# Patient Record
Sex: Female | Born: 1935 | Race: White | Hispanic: No | Marital: Married | Smoking: Former smoker
Health system: Southern US, Community
[De-identification: ages and names within clinical notes are randomized; demographics above are authoritative.]

## PROBLEM LIST (undated history)

## (undated) DIAGNOSIS — I341 Nonrheumatic mitral (valve) prolapse: Secondary | ICD-10-CM

## (undated) DIAGNOSIS — E039 Hypothyroidism, unspecified: Secondary | ICD-10-CM

## (undated) DIAGNOSIS — I5032 Chronic diastolic (congestive) heart failure: Secondary | ICD-10-CM

## (undated) DIAGNOSIS — E7800 Pure hypercholesterolemia, unspecified: Secondary | ICD-10-CM

## (undated) DIAGNOSIS — I4891 Unspecified atrial fibrillation: Secondary | ICD-10-CM

## (undated) DIAGNOSIS — J189 Pneumonia, unspecified organism: Secondary | ICD-10-CM

## (undated) DIAGNOSIS — E785 Hyperlipidemia, unspecified: Secondary | ICD-10-CM

## (undated) DIAGNOSIS — I10 Essential (primary) hypertension: Secondary | ICD-10-CM

## (undated) DIAGNOSIS — M545 Other chronic pain: Secondary | ICD-10-CM

## (undated) DIAGNOSIS — G8929 Other chronic pain: Secondary | ICD-10-CM

## (undated) DIAGNOSIS — D649 Anemia, unspecified: Secondary | ICD-10-CM

## (undated) DIAGNOSIS — I340 Nonrheumatic mitral (valve) insufficiency: Secondary | ICD-10-CM

## (undated) DIAGNOSIS — B029 Zoster without complications: Secondary | ICD-10-CM

## (undated) HISTORY — DX: Unspecified atrial fibrillation: I48.91

## (undated) HISTORY — DX: Other chronic pain: M54.50

## (undated) HISTORY — DX: Hypothyroidism, unspecified: E03.9

## (undated) HISTORY — DX: Essential (primary) hypertension: I10

## (undated) HISTORY — DX: Pneumonia, unspecified organism: J18.9

## (undated) HISTORY — DX: Other chronic pain: G89.29

## (undated) HISTORY — PX: OTHER SURGICAL HISTORY: SHX169

## (undated) HISTORY — DX: Zoster without complications: B02.9

## (undated) HISTORY — DX: Nonrheumatic mitral (valve) prolapse: I34.1

## (undated) HISTORY — DX: Low back pain: M54.5

## (undated) HISTORY — DX: Chronic diastolic (congestive) heart failure: I50.32

## (undated) HISTORY — DX: Anemia, unspecified: D64.9

## (undated) HISTORY — DX: Pure hypercholesterolemia, unspecified: E78.00

## (undated) HISTORY — DX: Nonrheumatic mitral (valve) insufficiency: I34.0

## (undated) HISTORY — DX: Hyperlipidemia, unspecified: E78.5

---

## 1997-11-29 ENCOUNTER — Ambulatory Visit (HOSPITAL_COMMUNITY): Admission: RE | Admit: 1997-11-29 | Discharge: 1997-11-29 | Payer: Self-pay | Admitting: Obstetrics and Gynecology

## 1997-12-04 ENCOUNTER — Ambulatory Visit (HOSPITAL_COMMUNITY): Admission: RE | Admit: 1997-12-04 | Discharge: 1997-12-04 | Payer: Self-pay | Admitting: Obstetrics and Gynecology

## 1998-12-09 ENCOUNTER — Ambulatory Visit (HOSPITAL_COMMUNITY): Admission: RE | Admit: 1998-12-09 | Discharge: 1998-12-09 | Payer: Self-pay | Admitting: Obstetrics and Gynecology

## 1998-12-09 ENCOUNTER — Encounter: Payer: Self-pay | Admitting: Obstetrics and Gynecology

## 1999-12-17 ENCOUNTER — Ambulatory Visit (HOSPITAL_COMMUNITY): Admission: RE | Admit: 1999-12-17 | Discharge: 1999-12-17 | Payer: Self-pay | Admitting: Obstetrics and Gynecology

## 1999-12-17 ENCOUNTER — Encounter: Payer: Self-pay | Admitting: Obstetrics and Gynecology

## 2000-12-20 ENCOUNTER — Ambulatory Visit (HOSPITAL_COMMUNITY): Admission: RE | Admit: 2000-12-20 | Discharge: 2000-12-20 | Payer: Self-pay | Admitting: Pulmonary Disease

## 2000-12-20 ENCOUNTER — Encounter: Payer: Self-pay | Admitting: Pulmonary Disease

## 2001-11-30 ENCOUNTER — Other Ambulatory Visit: Admission: RE | Admit: 2001-11-30 | Discharge: 2001-11-30 | Payer: Self-pay | Admitting: Obstetrics and Gynecology

## 2001-12-27 ENCOUNTER — Encounter: Payer: Self-pay | Admitting: Pulmonary Disease

## 2001-12-27 ENCOUNTER — Ambulatory Visit (HOSPITAL_COMMUNITY): Admission: RE | Admit: 2001-12-27 | Discharge: 2001-12-27 | Payer: Self-pay | Admitting: Pulmonary Disease

## 2003-01-03 ENCOUNTER — Ambulatory Visit (HOSPITAL_COMMUNITY): Admission: RE | Admit: 2003-01-03 | Discharge: 2003-01-03 | Payer: Self-pay | Admitting: Pulmonary Disease

## 2003-01-03 ENCOUNTER — Encounter: Payer: Self-pay | Admitting: Pulmonary Disease

## 2003-08-25 ENCOUNTER — Emergency Department (HOSPITAL_COMMUNITY): Admission: EM | Admit: 2003-08-25 | Discharge: 2003-08-25 | Payer: Self-pay | Admitting: Emergency Medicine

## 2004-01-15 ENCOUNTER — Ambulatory Visit (HOSPITAL_COMMUNITY): Admission: RE | Admit: 2004-01-15 | Discharge: 2004-01-15 | Payer: Self-pay | Admitting: Obstetrics and Gynecology

## 2004-07-31 ENCOUNTER — Ambulatory Visit: Payer: Self-pay

## 2004-08-21 ENCOUNTER — Ambulatory Visit: Payer: Self-pay | Admitting: Pulmonary Disease

## 2004-08-28 ENCOUNTER — Ambulatory Visit: Payer: Self-pay | Admitting: Cardiology

## 2004-09-25 ENCOUNTER — Ambulatory Visit: Payer: Self-pay | Admitting: Cardiovascular Disease

## 2004-09-25 ENCOUNTER — Emergency Department (HOSPITAL_COMMUNITY): Admission: EM | Admit: 2004-09-25 | Discharge: 2004-09-25 | Payer: Self-pay | Admitting: Family Medicine

## 2004-10-23 ENCOUNTER — Ambulatory Visit: Payer: Self-pay | Admitting: Cardiology

## 2004-11-20 ENCOUNTER — Ambulatory Visit: Payer: Self-pay | Admitting: Internal Medicine

## 2004-12-18 ENCOUNTER — Ambulatory Visit: Payer: Self-pay | Admitting: Internal Medicine

## 2005-01-15 ENCOUNTER — Ambulatory Visit: Payer: Self-pay | Admitting: Cardiovascular Disease

## 2005-01-18 ENCOUNTER — Ambulatory Visit (HOSPITAL_COMMUNITY): Admission: RE | Admit: 2005-01-18 | Discharge: 2005-01-18 | Payer: Self-pay | Admitting: Pulmonary Disease

## 2005-01-21 ENCOUNTER — Ambulatory Visit: Payer: Self-pay | Admitting: Pulmonary Disease

## 2005-01-27 ENCOUNTER — Ambulatory Visit: Payer: Self-pay | Admitting: Pulmonary Disease

## 2005-02-05 ENCOUNTER — Ambulatory Visit: Payer: Self-pay | Admitting: Cardiology

## 2005-03-05 ENCOUNTER — Ambulatory Visit: Payer: Self-pay | Admitting: Cardiology

## 2005-04-02 ENCOUNTER — Ambulatory Visit: Payer: Self-pay | Admitting: Cardiology

## 2005-04-23 ENCOUNTER — Ambulatory Visit: Payer: Self-pay | Admitting: Cardiology

## 2005-05-21 ENCOUNTER — Ambulatory Visit: Payer: Self-pay | Admitting: Internal Medicine

## 2005-06-11 ENCOUNTER — Ambulatory Visit: Payer: Self-pay | Admitting: Cardiology

## 2005-06-25 ENCOUNTER — Ambulatory Visit: Payer: Self-pay | Admitting: Pulmonary Disease

## 2005-07-09 ENCOUNTER — Ambulatory Visit: Payer: Self-pay | Admitting: Cardiology

## 2005-07-22 ENCOUNTER — Ambulatory Visit: Payer: Self-pay | Admitting: Pulmonary Disease

## 2005-08-09 ENCOUNTER — Ambulatory Visit: Payer: Self-pay | Admitting: Cardiology

## 2005-09-10 ENCOUNTER — Ambulatory Visit: Payer: Self-pay | Admitting: Cardiology

## 2005-10-08 ENCOUNTER — Ambulatory Visit: Payer: Self-pay | Admitting: Cardiology

## 2005-11-05 ENCOUNTER — Ambulatory Visit: Payer: Self-pay | Admitting: Internal Medicine

## 2005-12-03 ENCOUNTER — Ambulatory Visit: Payer: Self-pay | Admitting: Cardiovascular Disease

## 2005-12-31 ENCOUNTER — Ambulatory Visit: Payer: Self-pay | Admitting: Cardiology

## 2006-01-19 ENCOUNTER — Ambulatory Visit (HOSPITAL_COMMUNITY): Admission: RE | Admit: 2006-01-19 | Discharge: 2006-01-19 | Payer: Self-pay | Admitting: Pulmonary Disease

## 2006-01-20 ENCOUNTER — Ambulatory Visit: Payer: Self-pay | Admitting: Pulmonary Disease

## 2006-01-25 ENCOUNTER — Ambulatory Visit: Payer: Self-pay | Admitting: Pulmonary Disease

## 2006-01-28 ENCOUNTER — Ambulatory Visit: Payer: Self-pay | Admitting: Cardiology

## 2006-02-25 ENCOUNTER — Ambulatory Visit: Payer: Self-pay | Admitting: Cardiology

## 2006-03-25 ENCOUNTER — Ambulatory Visit: Payer: Self-pay | Admitting: Cardiology

## 2006-04-22 ENCOUNTER — Ambulatory Visit: Payer: Self-pay | Admitting: Cardiology

## 2006-04-24 ENCOUNTER — Emergency Department (HOSPITAL_COMMUNITY): Admission: EM | Admit: 2006-04-24 | Discharge: 2006-04-24 | Payer: Self-pay | Admitting: Emergency Medicine

## 2006-05-20 ENCOUNTER — Ambulatory Visit: Payer: Self-pay | Admitting: Cardiology

## 2006-06-20 ENCOUNTER — Ambulatory Visit: Payer: Self-pay | Admitting: Cardiovascular Disease

## 2006-06-28 ENCOUNTER — Ambulatory Visit: Payer: Self-pay | Admitting: Pulmonary Disease

## 2006-07-08 ENCOUNTER — Ambulatory Visit: Payer: Self-pay | Admitting: Internal Medicine

## 2006-07-25 ENCOUNTER — Ambulatory Visit: Payer: Self-pay | Admitting: Internal Medicine

## 2006-07-25 ENCOUNTER — Ambulatory Visit: Payer: Self-pay | Admitting: Pulmonary Disease

## 2006-07-29 ENCOUNTER — Ambulatory Visit: Payer: Self-pay | Admitting: Cardiovascular Disease

## 2006-08-25 ENCOUNTER — Ambulatory Visit: Payer: Self-pay | Admitting: Pulmonary Disease

## 2006-08-26 ENCOUNTER — Ambulatory Visit: Payer: Self-pay | Admitting: Internal Medicine

## 2006-09-02 ENCOUNTER — Ambulatory Visit: Payer: Self-pay | Admitting: Cardiovascular Disease

## 2006-09-16 ENCOUNTER — Ambulatory Visit: Payer: Self-pay | Admitting: Cardiology

## 2006-09-30 ENCOUNTER — Ambulatory Visit: Payer: Self-pay | Admitting: Cardiovascular Disease

## 2006-10-21 ENCOUNTER — Ambulatory Visit: Payer: Self-pay | Admitting: *Deleted

## 2006-11-18 ENCOUNTER — Ambulatory Visit: Payer: Self-pay | Admitting: Cardiology

## 2006-12-09 ENCOUNTER — Ambulatory Visit: Payer: Self-pay | Admitting: Cardiology

## 2006-12-30 ENCOUNTER — Ambulatory Visit: Payer: Self-pay | Admitting: Cardiology

## 2007-01-13 ENCOUNTER — Ambulatory Visit: Payer: Self-pay | Admitting: Cardiology

## 2007-01-17 ENCOUNTER — Ambulatory Visit: Payer: Self-pay | Admitting: Pulmonary Disease

## 2007-01-17 LAB — CONVERTED CEMR LAB
AST: 30 units/L (ref 0–37)
Albumin: 3.8 g/dL (ref 3.5–5.2)
Alkaline Phosphatase: 59 units/L (ref 39–117)
BUN: 12 mg/dL (ref 6–23)
Basophils Absolute: 0.1 10*3/uL (ref 0.0–0.1)
Chloride: 106 meq/L (ref 96–112)
Creatinine, Ser: 0.9 mg/dL (ref 0.4–1.2)
HCT: 40.5 % (ref 36.0–46.0)
HDL: 55.8 mg/dL (ref >39.0)
Hemoglobin, Urine: NEGATIVE
Ketones, ur: NEGATIVE mg/dL
LDL Cholesterol: 102 mg/dL — ABNORMAL HIGH (ref 0–99)
MCHC: 33.1 g/dL (ref 30.0–36.0)
Monocytes Relative: 8.8 % (ref 3.0–11.0)
RBC: 4.07 M/uL (ref 3.87–5.11)
RDW: 13 % (ref 11.5–14.6)
Total Bilirubin: 1 mg/dL (ref 0.3–1.2)
Total Protein, Urine: NEGATIVE mg/dL
Urobilinogen, UA: 0.2 (ref 0.0–1.0)
VLDL: 23 mg/dL (ref 0–40)
pH: 7 (ref 5.0–8.0)

## 2007-01-23 ENCOUNTER — Ambulatory Visit: Payer: Self-pay | Admitting: Pulmonary Disease

## 2007-01-24 ENCOUNTER — Ambulatory Visit (HOSPITAL_COMMUNITY): Admission: RE | Admit: 2007-01-24 | Discharge: 2007-01-24 | Payer: Self-pay | Admitting: Pulmonary Disease

## 2007-02-03 ENCOUNTER — Ambulatory Visit: Payer: Self-pay | Admitting: Internal Medicine

## 2007-02-17 ENCOUNTER — Ambulatory Visit: Payer: Self-pay | Admitting: Cardiology

## 2007-02-21 ENCOUNTER — Ambulatory Visit: Payer: Self-pay

## 2007-02-21 ENCOUNTER — Encounter: Payer: Self-pay | Admitting: Pulmonary Disease

## 2007-02-24 ENCOUNTER — Ambulatory Visit: Payer: Self-pay | Admitting: Pulmonary Disease

## 2007-03-02 ENCOUNTER — Ambulatory Visit: Payer: Self-pay | Admitting: Cardiovascular Disease

## 2007-03-02 ENCOUNTER — Ambulatory Visit: Payer: Self-pay | Admitting: Internal Medicine

## 2007-03-16 ENCOUNTER — Ambulatory Visit: Payer: Self-pay | Admitting: Internal Medicine

## 2007-04-07 ENCOUNTER — Ambulatory Visit: Payer: Self-pay | Admitting: Cardiology

## 2007-05-05 ENCOUNTER — Ambulatory Visit: Payer: Self-pay | Admitting: Cardiovascular Disease

## 2007-06-05 ENCOUNTER — Ambulatory Visit: Payer: Self-pay | Admitting: Internal Medicine

## 2007-06-28 ENCOUNTER — Ambulatory Visit: Payer: Self-pay | Admitting: Pulmonary Disease

## 2007-06-30 ENCOUNTER — Ambulatory Visit: Payer: Self-pay | Admitting: Cardiology

## 2007-07-24 ENCOUNTER — Ambulatory Visit: Payer: Self-pay | Admitting: Pulmonary Disease

## 2007-07-28 ENCOUNTER — Ambulatory Visit: Payer: Self-pay | Admitting: Cardiology

## 2007-08-14 ENCOUNTER — Ambulatory Visit: Payer: Self-pay | Admitting: Internal Medicine

## 2007-09-01 ENCOUNTER — Ambulatory Visit: Payer: Self-pay | Admitting: Cardiology

## 2007-09-18 ENCOUNTER — Ambulatory Visit: Payer: Self-pay | Admitting: Cardiology

## 2007-10-06 ENCOUNTER — Ambulatory Visit: Payer: Self-pay | Admitting: Internal Medicine

## 2007-10-27 ENCOUNTER — Ambulatory Visit: Payer: Self-pay | Admitting: Internal Medicine

## 2007-11-10 ENCOUNTER — Ambulatory Visit: Payer: Self-pay | Admitting: Internal Medicine

## 2007-11-15 ENCOUNTER — Telehealth (INDEPENDENT_AMBULATORY_CARE_PROVIDER_SITE_OTHER): Payer: Self-pay | Admitting: *Deleted

## 2007-11-17 ENCOUNTER — Telehealth (INDEPENDENT_AMBULATORY_CARE_PROVIDER_SITE_OTHER): Payer: Self-pay | Admitting: *Deleted

## 2007-12-01 ENCOUNTER — Ambulatory Visit: Payer: Self-pay | Admitting: Cardiovascular Disease

## 2007-12-29 ENCOUNTER — Ambulatory Visit: Payer: Self-pay | Admitting: Internal Medicine

## 2008-01-15 ENCOUNTER — Telehealth (INDEPENDENT_AMBULATORY_CARE_PROVIDER_SITE_OTHER): Payer: Self-pay | Admitting: *Deleted

## 2008-01-17 ENCOUNTER — Ambulatory Visit: Payer: Self-pay | Admitting: Pulmonary Disease

## 2008-01-17 DIAGNOSIS — M810 Age-related osteoporosis without current pathological fracture: Secondary | ICD-10-CM | POA: Insufficient documentation

## 2008-01-17 DIAGNOSIS — Z87898 Personal history of other specified conditions: Secondary | ICD-10-CM | POA: Insufficient documentation

## 2008-01-17 DIAGNOSIS — E039 Hypothyroidism, unspecified: Secondary | ICD-10-CM | POA: Insufficient documentation

## 2008-01-17 DIAGNOSIS — M545 Low back pain: Secondary | ICD-10-CM | POA: Insufficient documentation

## 2008-01-17 DIAGNOSIS — J309 Allergic rhinitis, unspecified: Secondary | ICD-10-CM | POA: Insufficient documentation

## 2008-01-18 ENCOUNTER — Ambulatory Visit: Payer: Self-pay | Admitting: Pulmonary Disease

## 2008-01-18 DIAGNOSIS — I059 Rheumatic mitral valve disease, unspecified: Secondary | ICD-10-CM | POA: Insufficient documentation

## 2008-01-18 DIAGNOSIS — I340 Nonrheumatic mitral (valve) insufficiency: Secondary | ICD-10-CM | POA: Insufficient documentation

## 2008-01-18 DIAGNOSIS — H9190 Unspecified hearing loss, unspecified ear: Secondary | ICD-10-CM | POA: Insufficient documentation

## 2008-01-18 DIAGNOSIS — I4891 Unspecified atrial fibrillation: Secondary | ICD-10-CM

## 2008-01-18 LAB — CONVERTED CEMR LAB
ALT: 21 units/L (ref 0–35)
AST: 30 units/L (ref 0–37)
Alkaline Phosphatase: 60 units/L (ref 39–117)
Basophils Absolute: 0.1 10*3/uL (ref 0.0–0.1)
Basophils Relative: 0.9 % (ref 0.0–1.0)
CO2: 27 meq/L (ref 19–32)
Chloride: 108 meq/L (ref 96–112)
Creatinine, Ser: 0.9 mg/dL (ref 0.4–1.2)
Eosinophils Relative: 2 % (ref 0.0–5.0)
LDL Cholesterol: 108 mg/dL — ABNORMAL HIGH (ref 0–99)
Lymphocytes Relative: 24 % (ref 12.0–46.0)
MCHC: 32.5 g/dL (ref 30.0–36.0)
Neutrophils Relative %: 63 % (ref 43.0–77.0)
Potassium: 4.5 meq/L (ref 3.5–5.1)
RBC: 3.91 M/uL (ref 3.87–5.11)
TSH: 3.71 microintl units/mL (ref 0.35–5.50)
Total Bilirubin: 1.3 mg/dL — ABNORMAL HIGH (ref 0.3–1.2)
VLDL: 19 mg/dL (ref 0–40)
WBC: 5.6 10*3/uL (ref 4.5–10.5)

## 2008-01-25 ENCOUNTER — Ambulatory Visit (HOSPITAL_COMMUNITY): Admission: RE | Admit: 2008-01-25 | Discharge: 2008-01-25 | Payer: Self-pay | Admitting: Pulmonary Disease

## 2008-01-26 ENCOUNTER — Ambulatory Visit: Payer: Self-pay | Admitting: Cardiology

## 2008-02-23 ENCOUNTER — Ambulatory Visit: Payer: Self-pay | Admitting: Cardiology

## 2008-03-14 ENCOUNTER — Ambulatory Visit: Payer: Self-pay | Admitting: Cardiology

## 2008-04-12 ENCOUNTER — Ambulatory Visit: Payer: Self-pay | Admitting: Cardiology

## 2008-04-26 ENCOUNTER — Ambulatory Visit: Payer: Self-pay | Admitting: Cardiovascular Disease

## 2008-05-24 ENCOUNTER — Ambulatory Visit: Payer: Self-pay | Admitting: Internal Medicine

## 2008-06-21 ENCOUNTER — Ambulatory Visit: Payer: Self-pay | Admitting: Cardiology

## 2008-07-17 ENCOUNTER — Ambulatory Visit: Payer: Self-pay | Admitting: Pulmonary Disease

## 2008-07-19 ENCOUNTER — Ambulatory Visit: Payer: Self-pay | Admitting: Cardiology

## 2008-08-16 ENCOUNTER — Ambulatory Visit: Payer: Self-pay | Admitting: Internal Medicine

## 2008-08-28 ENCOUNTER — Ambulatory Visit: Payer: Self-pay | Admitting: Internal Medicine

## 2008-08-28 ENCOUNTER — Encounter: Payer: Self-pay | Admitting: Pulmonary Disease

## 2008-09-12 ENCOUNTER — Ambulatory Visit: Payer: Self-pay | Admitting: Internal Medicine

## 2008-10-11 ENCOUNTER — Ambulatory Visit: Payer: Self-pay | Admitting: Cardiovascular Disease

## 2008-11-08 ENCOUNTER — Ambulatory Visit: Payer: Self-pay | Admitting: Internal Medicine

## 2008-11-12 ENCOUNTER — Telehealth: Payer: Self-pay | Admitting: Pulmonary Disease

## 2008-11-19 ENCOUNTER — Telehealth (INDEPENDENT_AMBULATORY_CARE_PROVIDER_SITE_OTHER): Payer: Self-pay | Admitting: *Deleted

## 2008-12-06 ENCOUNTER — Ambulatory Visit: Payer: Self-pay | Admitting: Cardiology

## 2008-12-23 ENCOUNTER — Ambulatory Visit: Payer: Self-pay | Admitting: Internal Medicine

## 2009-01-03 ENCOUNTER — Ambulatory Visit: Payer: Self-pay | Admitting: Cardiology

## 2009-01-07 ENCOUNTER — Telehealth: Payer: Self-pay | Admitting: Pulmonary Disease

## 2009-01-13 ENCOUNTER — Ambulatory Visit: Payer: Self-pay | Admitting: Pulmonary Disease

## 2009-01-13 DIAGNOSIS — I10 Essential (primary) hypertension: Secondary | ICD-10-CM | POA: Insufficient documentation

## 2009-01-16 ENCOUNTER — Ambulatory Visit: Payer: Self-pay | Admitting: Pulmonary Disease

## 2009-01-16 LAB — CONVERTED CEMR LAB
AST: 27 units/L (ref 0–37)
Alkaline Phosphatase: 60 units/L (ref 39–117)
Basophils Absolute: 0 10*3/uL (ref 0.0–0.1)
Bilirubin, Direct: 0.2 mg/dL (ref 0.0–0.3)
Calcium: 9.2 mg/dL (ref 8.4–10.5)
GFR calc non Af Amer: 72.33 mL/min (ref >60)
Glucose, Bld: 82 mg/dL (ref 70–99)
HCT: 37.1 % (ref 36.0–46.0)
Hemoglobin: 12.5 g/dL (ref 12.0–15.0)
LDL Cholesterol: 94 mg/dL (ref 0–99)
Lymphs Abs: 1.4 10*3/uL (ref 0.7–4.0)
MCV: 102.4 fL — ABNORMAL HIGH (ref 78.0–100.0)
Monocytes Absolute: 0.6 10*3/uL (ref 0.1–1.0)
Neutro Abs: 4.6 10*3/uL (ref 1.4–7.7)
Platelets: 181 10*3/uL (ref 150.0–400.0)
Potassium: 4.5 meq/L (ref 3.5–5.1)
RDW: 13.1 % (ref 11.5–14.6)
Sodium: 141 meq/L (ref 135–145)
TSH: 3.5 microintl units/mL (ref 0.35–5.50)
Total Bilirubin: 1 mg/dL (ref 0.3–1.2)
Total CHOL/HDL Ratio: 3
VLDL: 17.2 mg/dL (ref 0.0–40.0)

## 2009-01-20 ENCOUNTER — Ambulatory Visit: Payer: Self-pay | Admitting: Cardiovascular Disease

## 2009-02-03 ENCOUNTER — Ambulatory Visit (HOSPITAL_COMMUNITY): Admission: RE | Admit: 2009-02-03 | Discharge: 2009-02-03 | Payer: Self-pay | Admitting: Pulmonary Disease

## 2009-02-11 ENCOUNTER — Encounter: Payer: Self-pay | Admitting: *Deleted

## 2009-02-14 ENCOUNTER — Ambulatory Visit: Payer: Self-pay | Admitting: Cardiology

## 2009-03-14 ENCOUNTER — Ambulatory Visit: Payer: Self-pay | Admitting: Cardiovascular Disease

## 2009-03-14 LAB — CONVERTED CEMR LAB
POC INR: 2.3
Prothrombin Time: 18.7 s

## 2009-03-18 ENCOUNTER — Telehealth (INDEPENDENT_AMBULATORY_CARE_PROVIDER_SITE_OTHER): Payer: Self-pay | Admitting: *Deleted

## 2009-03-19 ENCOUNTER — Encounter: Payer: Self-pay | Admitting: *Deleted

## 2009-04-11 ENCOUNTER — Ambulatory Visit: Payer: Self-pay | Admitting: Cardiology

## 2009-04-11 LAB — CONVERTED CEMR LAB: POC INR: 2.8

## 2009-04-23 ENCOUNTER — Encounter: Payer: Self-pay | Admitting: Cardiology

## 2009-05-09 ENCOUNTER — Ambulatory Visit: Payer: Self-pay | Admitting: Cardiovascular Disease

## 2009-05-09 LAB — CONVERTED CEMR LAB: POC INR: 3.2

## 2009-05-30 ENCOUNTER — Ambulatory Visit: Payer: Self-pay | Admitting: Internal Medicine

## 2009-05-30 LAB — CONVERTED CEMR LAB: POC INR: 3.7

## 2009-06-16 ENCOUNTER — Encounter: Payer: Self-pay | Admitting: Pulmonary Disease

## 2009-06-16 ENCOUNTER — Ambulatory Visit: Payer: Self-pay | Admitting: Internal Medicine

## 2009-07-11 ENCOUNTER — Ambulatory Visit: Payer: Self-pay | Admitting: Internal Medicine

## 2009-07-11 LAB — CONVERTED CEMR LAB: POC INR: 2.8

## 2009-07-16 ENCOUNTER — Ambulatory Visit: Payer: Self-pay | Admitting: Pulmonary Disease

## 2009-08-11 ENCOUNTER — Ambulatory Visit: Payer: Self-pay | Admitting: Cardiology

## 2009-08-11 LAB — CONVERTED CEMR LAB: POC INR: 2.9

## 2009-09-04 ENCOUNTER — Ambulatory Visit: Payer: Self-pay | Admitting: Cardiology

## 2009-09-11 ENCOUNTER — Encounter: Payer: Self-pay | Admitting: Cardiology

## 2009-10-06 ENCOUNTER — Ambulatory Visit: Payer: Self-pay | Admitting: Cardiology

## 2009-10-24 ENCOUNTER — Encounter (INDEPENDENT_AMBULATORY_CARE_PROVIDER_SITE_OTHER): Payer: Self-pay | Admitting: Cardiology

## 2009-10-24 ENCOUNTER — Ambulatory Visit: Payer: Self-pay | Admitting: Internal Medicine

## 2009-10-24 LAB — CONVERTED CEMR LAB: POC INR: 2.6

## 2009-11-21 ENCOUNTER — Ambulatory Visit: Payer: Self-pay | Admitting: Cardiology

## 2009-11-21 LAB — CONVERTED CEMR LAB: POC INR: 2.5

## 2009-12-19 ENCOUNTER — Ambulatory Visit: Payer: Self-pay | Admitting: Cardiology

## 2010-01-12 ENCOUNTER — Ambulatory Visit: Payer: Self-pay | Admitting: Pulmonary Disease

## 2010-01-13 ENCOUNTER — Ambulatory Visit: Payer: Self-pay | Admitting: Pulmonary Disease

## 2010-01-16 ENCOUNTER — Ambulatory Visit: Payer: Self-pay | Admitting: Internal Medicine

## 2010-01-16 LAB — CONVERTED CEMR LAB: POC INR: 2.8

## 2010-01-18 LAB — CONVERTED CEMR LAB
Alkaline Phosphatase: 68 units/L (ref 39–117)
BUN: 12 mg/dL (ref 6–23)
Basophils Absolute: 0 10*3/uL (ref 0.0–0.1)
Bilirubin, Direct: 0.2 mg/dL (ref 0.0–0.3)
Chloride: 104 meq/L (ref 96–112)
Cholesterol: 170 mg/dL (ref 0–200)
Eosinophils Absolute: 0.1 10*3/uL (ref 0.0–0.7)
GFR calc non Af Amer: 62.99 mL/min (ref >60)
HDL: 57.2 mg/dL (ref >39.00)
LDL Cholesterol: 94 mg/dL (ref 0–99)
Lymphocytes Relative: 20.4 % (ref 12.0–46.0)
MCHC: 33.8 g/dL (ref 30.0–36.0)
MCV: 98.7 fL (ref 78.0–100.0)
Monocytes Absolute: 0.7 10*3/uL (ref 0.1–1.0)
Neutrophils Relative %: 66.2 % (ref 43.0–77.0)
Platelets: 185 10*3/uL (ref 150.0–400.0)
Potassium: 4.7 meq/L (ref 3.5–5.1)
RDW: 15.3 % — ABNORMAL HIGH (ref 11.5–14.6)
Sodium: 139 meq/L (ref 135–145)
Total Bilirubin: 1 mg/dL (ref 0.3–1.2)
Total Protein: 6.3 g/dL (ref 6.0–8.3)
VLDL: 18.8 mg/dL (ref 0.0–40.0)
Vit D, 25-Hydroxy: 36 ng/mL (ref 30–89)

## 2010-02-05 ENCOUNTER — Ambulatory Visit (HOSPITAL_COMMUNITY): Admission: RE | Admit: 2010-02-05 | Discharge: 2010-02-05 | Payer: Self-pay | Admitting: Pulmonary Disease

## 2010-02-06 ENCOUNTER — Ambulatory Visit: Payer: Self-pay | Admitting: Cardiology

## 2010-02-06 LAB — CONVERTED CEMR LAB: POC INR: 2.1

## 2010-03-06 ENCOUNTER — Ambulatory Visit: Payer: Self-pay | Admitting: Cardiology

## 2010-04-03 ENCOUNTER — Ambulatory Visit: Payer: Self-pay | Admitting: Cardiology

## 2010-04-03 LAB — CONVERTED CEMR LAB: POC INR: 2

## 2010-05-01 ENCOUNTER — Ambulatory Visit: Payer: Self-pay | Admitting: Cardiology

## 2010-05-01 LAB — CONVERTED CEMR LAB: POC INR: 1.9

## 2010-05-22 ENCOUNTER — Ambulatory Visit: Payer: Self-pay | Admitting: Internal Medicine

## 2010-06-19 ENCOUNTER — Ambulatory Visit: Payer: Self-pay | Admitting: Cardiology

## 2010-07-16 ENCOUNTER — Ambulatory Visit: Payer: Self-pay | Admitting: Pulmonary Disease

## 2010-07-16 DIAGNOSIS — D649 Anemia, unspecified: Secondary | ICD-10-CM | POA: Insufficient documentation

## 2010-07-17 ENCOUNTER — Telehealth: Payer: Self-pay | Admitting: Pulmonary Disease

## 2010-07-17 ENCOUNTER — Ambulatory Visit: Payer: Self-pay | Admitting: Cardiology

## 2010-07-17 LAB — CONVERTED CEMR LAB
BUN: 13 mg/dL (ref 6–23)
Basophils Absolute: 0 10*3/uL (ref 0.0–0.1)
CO2: 25 meq/L (ref 19–32)
Calcium: 9.1 mg/dL (ref 8.4–10.5)
Eosinophils Absolute: 0.1 10*3/uL (ref 0.0–0.7)
Glucose, Bld: 79 mg/dL (ref 70–99)
HCT: 32.1 % — ABNORMAL LOW (ref 36.0–46.0)
Hemoglobin: 10.9 g/dL — ABNORMAL LOW (ref 12.0–15.0)
Lymphocytes Relative: 19 % (ref 12.0–46.0)
Lymphs Abs: 1.2 10*3/uL (ref 0.7–4.0)
MCHC: 34 g/dL (ref 30.0–36.0)
Neutro Abs: 4.1 10*3/uL (ref 1.4–7.7)
Platelets: 200 10*3/uL (ref 150.0–400.0)
RDW: 15.3 % — ABNORMAL HIGH (ref 11.5–14.6)
Sodium: 136 meq/L (ref 135–145)
TSH: 5.43 microintl units/mL (ref 0.35–5.50)

## 2010-07-19 ENCOUNTER — Encounter: Payer: Self-pay | Admitting: Cardiovascular Disease

## 2010-07-19 ENCOUNTER — Inpatient Hospital Stay (HOSPITAL_COMMUNITY): Admission: EM | Admit: 2010-07-19 | Discharge: 2010-07-22 | Payer: Self-pay | Admitting: Emergency Medicine

## 2010-07-20 ENCOUNTER — Telehealth: Payer: Self-pay | Admitting: Pulmonary Disease

## 2010-07-20 ENCOUNTER — Ambulatory Visit: Payer: Self-pay | Admitting: Pulmonary Disease

## 2010-07-20 ENCOUNTER — Encounter (INDEPENDENT_AMBULATORY_CARE_PROVIDER_SITE_OTHER): Payer: Self-pay | Admitting: Internal Medicine

## 2010-07-27 ENCOUNTER — Telehealth (INDEPENDENT_AMBULATORY_CARE_PROVIDER_SITE_OTHER): Payer: Self-pay | Admitting: *Deleted

## 2010-07-29 ENCOUNTER — Ambulatory Visit: Payer: Self-pay | Admitting: Pulmonary Disease

## 2010-07-31 LAB — CONVERTED CEMR LAB
BUN: 21 mg/dL (ref 6–23)
Calcium: 10.2 mg/dL (ref 8.4–10.5)
GFR calc non Af Amer: 61.33 mL/min (ref >60)
Glucose, Bld: 93 mg/dL (ref 70–99)

## 2010-08-03 ENCOUNTER — Telehealth: Payer: Self-pay | Admitting: Pulmonary Disease

## 2010-08-14 ENCOUNTER — Telehealth (INDEPENDENT_AMBULATORY_CARE_PROVIDER_SITE_OTHER): Payer: Self-pay | Admitting: *Deleted

## 2010-08-14 ENCOUNTER — Ambulatory Visit: Payer: Self-pay | Admitting: Cardiovascular Disease

## 2010-08-20 ENCOUNTER — Telehealth: Payer: Self-pay | Admitting: Pulmonary Disease

## 2010-08-24 ENCOUNTER — Telehealth: Payer: Self-pay | Admitting: Pulmonary Disease

## 2010-08-25 ENCOUNTER — Encounter: Payer: Self-pay | Admitting: Internal Medicine

## 2010-08-25 ENCOUNTER — Ambulatory Visit: Payer: Self-pay | Admitting: Pulmonary Disease

## 2010-08-25 ENCOUNTER — Telehealth: Payer: Self-pay | Admitting: Pulmonary Disease

## 2010-08-25 LAB — CONVERTED CEMR LAB
Basophils Absolute: 0 10*3/uL (ref 0.0–0.1)
Eosinophils Relative: 1 % (ref 0.0–5.0)
INR: 4.8 — ABNORMAL HIGH (ref 0.8–1.0)
MCV: 99.7 fL (ref 78.0–100.0)
Monocytes Absolute: 0.8 10*3/uL (ref 0.1–1.0)
Neutrophils Relative %: 69.7 % (ref 43.0–77.0)
Platelets: 190 10*3/uL (ref 150.0–400.0)
Prothrombin Time: 51.5 s — ABNORMAL HIGH (ref 9.7–11.8)
Prothrombin Time: 51.5 s
WBC: 7.1 10*3/uL (ref 4.5–10.5)

## 2010-08-28 ENCOUNTER — Ambulatory Visit: Payer: Self-pay | Admitting: Cardiology

## 2010-08-31 ENCOUNTER — Ambulatory Visit: Payer: Self-pay | Admitting: Pulmonary Disease

## 2010-09-04 ENCOUNTER — Encounter: Payer: Self-pay | Admitting: Pulmonary Disease

## 2010-09-10 ENCOUNTER — Encounter: Payer: Self-pay | Admitting: Pulmonary Disease

## 2010-09-18 ENCOUNTER — Ambulatory Visit: Admission: RE | Admit: 2010-09-18 | Discharge: 2010-09-18 | Payer: Self-pay | Source: Home / Self Care

## 2010-10-01 ENCOUNTER — Ambulatory Visit: Admission: RE | Admit: 2010-10-01 | Discharge: 2010-10-01 | Payer: Self-pay | Source: Home / Self Care

## 2010-10-09 DIAGNOSIS — E7800 Pure hypercholesterolemia, unspecified: Secondary | ICD-10-CM | POA: Insufficient documentation

## 2010-10-13 ENCOUNTER — Encounter: Payer: Self-pay | Admitting: Cardiovascular Disease

## 2010-10-13 ENCOUNTER — Ambulatory Visit: Admission: RE | Admit: 2010-10-13 | Discharge: 2010-10-13 | Payer: Self-pay | Source: Home / Self Care

## 2010-10-13 ENCOUNTER — Ambulatory Visit
Admission: RE | Admit: 2010-10-13 | Discharge: 2010-10-13 | Payer: Self-pay | Source: Home / Self Care | Attending: Cardiovascular Disease | Admitting: Cardiovascular Disease

## 2010-10-13 NOTE — Progress Notes (Signed)
Summary: clonazepam too strong  Phone Note From Other Clinic   Caller: Tiffany at [facility]  Call For: Dr. Kriste Basque Summary of Call: Tiffany states pt saw SN yesterday and he prescribed clonazepam for the pt but the pt was telling her that she took one tablet of this med last night and it "knocked her out" to the point where her husband had trouble getting her to wake up and he had to assist her to the bed. Pt is requesting either a lower dose or a different medication. Please adivise.  Initial call taken by: Carron Curie CMA,  July 17, 2010 11:58 AM  Follow-up for Phone Call        called and spoke with pt about her clonzepam and we will decrease to 1/2 tab daily---her cxr showed some extra fluid and may account for some of her SOB--lab showed some mild anemia which is new for her---she will return to the lab on monday for additional labs and will pick up some stool cards as well--order to asap 2d echo has been placed and we will call her with the results of this---start on lasix 40mg  every morning and the rx has been faxed back to [facility] on [address]--appt scheduled for pt to come back and see SN on 11-21 at 3:30 and pt is aware to come in at 3pm for repeat cxr. Randell Loop CMA  July 17, 2010 2:42 PM     New/Updated Medications: CLONAZEPAM 0.5 MG TABS (CLONAZEPAM) take 1/2  tab by mouth two times a day.Marland KitchenMarland Kitchen

## 2010-10-13 NOTE — Medication Information (Signed)
Summary: rov/sp  Anticoagulant Therapy  Managed by: Eda Keys, PharmD Referring MD: Charlies Constable MD PCP: Kriste Basque MD Supervising MD: Tenny Craw MD, Gunnar Fusi Indication 1: Atrial Fibrillation (ICD-427.31) Lab Used: [facility] Site: [facility] INR POC 2.8 INR RANGE 2 - 3  Dietary changes: no    Health status changes: no    Bleeding/hemorrhagic complications: no    Recent/future hospitalizations: no    Any changes in medication regimen? no    Recent/future dental: no  Any missed doses?: no       Is patient compliant with meds? yes       Allergies: 1)  ! Codeine  Anticoagulation Management History:      The patient is taking warfarin and comes in today for a routine follow up visit.  Positive risk factors for bleeding include an age of 75 years or older.  The bleeding index is 'intermediate risk'.  Positive CHADS2 values include History of HTN and Age > 72 years old.  The start date was 03/24/2001.  Anticoagulation responsible Feliza Diven: Tenny Craw MD, Gunnar Fusi.  INR POC: 2.8.  Cuvette Lot#: [id number].  Exp: 06/2011.    Anticoagulation Management Assessment/Plan:      The patient's current anticoagulation dose is Coumadin 2.5 mg tabs: Take as directed by coumadin clinic..  The target INR is 2.0-3.0.  The next INR is due 06/19/2010.  Anticoagulation instructions were given to patient.  Results were reviewed/authorized by Eda Keys, PharmD.  She was notified by Kennieth Francois.         Prior Anticoagulation Instructions: INR 1.9  Take 1 tablet today then increase dose to 1 tablet every day except 1/2 tablet on Monday and Friday.  Recheck INR in 3 weeks.   Current Anticoagulation Instructions: INR 2.8  Continue taking one tablet every day except one-half tablet on Monday and Friday.  We will see you in 4 weeks.

## 2010-10-13 NOTE — Medication Information (Signed)
Summary: rov/sp  Anticoagulant Therapy  Managed by: Elaina Pattee, PharmD Referring MD: Charlies Constable MD PCP: Kriste Basque MD Supervising MD: Riley Kill MD, Maisie Fus Indication 1: Atrial Fibrillation (ICD-427.31) Lab Used: [facility] Site: [address] INR POC 1.9 INR RANGE 2 - 3  Dietary changes: yes       Details: Tried to keep the same, but was on vacation.  Health status changes: no    Bleeding/hemorrhagic complications: no    Recent/future hospitalizations: no    Any changes in medication regimen? no    Recent/future dental: no  Any missed doses?: no       Is patient compliant with meds? yes       Allergies: 1)  ! Codeine  Anticoagulation Management History:      The patient is taking warfarin and comes in today for a routine follow up visit.  Positive risk factors for bleeding include an age of 75 years or older.  The bleeding index is 'intermediate risk'.  Positive CHADS2 values include History of HTN and Age > 34 years old.  The start date was 03/24/2001.  Anticoagulation responsible provider: Riley Kill MD, Maisie Fus.  INR POC: 1.9.  Cuvette Lot#: [id number].  Exp: 04/2011.    Anticoagulation Management Assessment/Plan:      The patient's current anticoagulation dose is Coumadin 2.5 mg tabs: Take as directed by coumadin clinic..  The target INR is 2.0-3.0.  The next INR is due 04/03/2010.  Anticoagulation instructions were given to patient.  Results were reviewed/authorized by Elaina Pattee, PharmD.  She was notified by Elaina Pattee, PharmD.         Prior Anticoagulation Instructions: INR 2.1  Continue same dose of 1 tablet every day except 1/2 tablet on Monday, Wednesday and Friday   Current Anticoagulation Instructions: INR 1.9. Take 1 tablet today, then take 1 tablet daily except 0.5 tablet on Mon, Wed, Fri. Recheck in 4 weeks.

## 2010-10-13 NOTE — Medication Information (Signed)
Summary: rov/cs   Anticoagulant Therapy  Managed by: Erin Rivers, PharmD Referring Rivers: Erin Rivers PCP: Erin Rivers Supervising Rivers: Erin Rivers, Bruce Indication 1: Atrial Fibrillation (ICD-427.31) Lab Used: [facility]  Site: [facility] INR POC 2.6 INR RANGE 2 - 3  Dietary changes: no    Health status changes: no    Bleeding/hemorrhagic complications: no    Recent/future hospitalizations: no    Any changes in medication regimen? yes       Details: Given Rx for clonazepam yesterday.   Recent/future dental: no  Any missed doses?: no       Is patient compliant with meds? yes      Comments: Pt took 1st dose of clonazepam last night and reported being extremely somnolent and unresponsive. Called Dr. Jodelle Green office and left message with nurse.   Allergies: 1)  ! Codeine  Anticoagulation Management History:      The patient is taking warfarin and comes in today for a routine follow up visit.  Positive risk factors for bleeding include an age of 75 years or older.  The bleeding index is 'intermediate risk'.  Positive CHADS2 values include History of HTN and Age > 32 years old.  The start date was 03/24/2001.  Her last INR was 2.5.  Anticoagulation responsible Judea Riches: Erin Rivers, Smitty Cords.  INR POC: 2.6.  Cuvette Lot#: [id number].  Exp: 07/2011.    Anticoagulation Management Assessment/Plan:      The patient's current anticoagulation dose is Coumadin 2.5 mg tabs: Take as directed by coumadin clinic..  The target INR is 2.0-3.0.  The next INR is due 08/14/2010.  Anticoagulation instructions were given to patient.  Results were reviewed/authorized by Erin Rivers, PharmD.  She was notified by Erin Rivers PharmD.         Prior Anticoagulation Instructions: INR 2.5  Continue Coumadin as scheduled:  1 tablet every day of the week except 1/2 tablet on Monday and Friday.    Return to clinic in 4 weeks.    Current Anticoagulation Instructions: INR 2.6 Continue 1 pill everyday except 1/2  pill on Mondays and Fridays. Recheck in 4 weeks.

## 2010-10-13 NOTE — Medication Information (Signed)
Summary: rov/ln  Anticoagulant Therapy  Managed by: Weston Brass, PharmD Referring MD: Charlies Constable MD PCP: Kriste Basque MD Supervising MD: Riley Kill MD, Maisie Fus Indication 1: Atrial Fibrillation (ICD-427.31) Lab Used: [facility] Site: [facility] INR POC 1.9 INR RANGE 2 - 3  Dietary changes: yes       Details: increased vitamin k   Health status changes: no    Bleeding/hemorrhagic complications: no    Recent/future hospitalizations: no    Any changes in medication regimen? no    Recent/future dental: no  Any missed doses?: no       Is patient compliant with meds? yes       Allergies: 1)  ! Codeine  Anticoagulation Management History:      The patient is taking warfarin and comes in today for a routine follow up visit.  Positive risk factors for bleeding include an age of 75 years or older.  The bleeding index is 'intermediate risk'.  Positive CHADS2 values include History of HTN and Age > 58 years old.  The start date was 03/24/2001.  Anticoagulation responsible provider: Riley Kill MD, Maisie Fus.  INR POC: 1.9.  Cuvette Lot#: [id number].  Exp: 06/2011.    Anticoagulation Management Assessment/Plan:      The patient's current anticoagulation dose is Coumadin 2.5 mg tabs: Take as directed by coumadin clinic..  The target INR is 2.0-3.0.  The next INR is due 05/22/2010.  Anticoagulation instructions were given to patient.  Results were reviewed/authorized by Weston Brass, PharmD.  She was notified by Weston Brass PharmD.         Prior Anticoagulation Instructions: INR 2.0  Take 1 tab today and then continue same dose of 1 tab daily except for 1/2 tab on Monday, Wednesday, and Friday.  Recheck in 4 weeks.  Current Anticoagulation Instructions: INR 1.9  Take 1 tablet today then increase dose to 1 tablet every day except 1/2 tablet on Monday and Friday.  Recheck INR in 3 weeks.

## 2010-10-13 NOTE — Letter (Signed)
Summary: Physician Statement Form/[employer]  Physician Statement Form/[employer]   Imported By: Sherian Rein 08/13/2010 11:58:35  _____________________________________________________________________  External Attachment:    Type:   Image     Comment:   External Document

## 2010-10-13 NOTE — Medication Information (Signed)
Summary: rov/tm  Anticoagulant Therapy  Managed by: Weston Brass, PharmD Referring MD: Charlies Constable MD Supervising MD: Daleen Squibb MD, Maisie Fus Indication 1: Atrial Fibrillation (ICD-427.31) Lab Used: [facility] Site: [facility] INR POC 2.3 INR RANGE 2 - 3  Dietary changes: no    Health status changes: no    Bleeding/hemorrhagic complications: no    Recent/future hospitalizations: no    Any changes in medication regimen? no    Recent/future dental: no  Any missed doses?: no       Is patient compliant with meds? yes       Allergies: 1)  ! Codeine  Anticoagulation Management History:      The patient is taking warfarin and comes in today for a routine follow up visit.  Positive risk factors for bleeding include an age of 75 years or older.  The bleeding index is 'intermediate risk'.  Positive CHADS2 values include History of HTN and Age > 46 years old.  The start date was 03/24/2001.  Anticoagulation responsible provider: Daleen Squibb MD, Maisie Fus.  INR POC: 2.3.  Cuvette Lot#: [id number].  Exp: 01/2011.    Anticoagulation Management Assessment/Plan:      The patient's current anticoagulation dose is Coumadin 2.5 mg tabs: Take as directed by coumadin clinic..  The target INR is 2.0-3.0.  The next INR is due 01/16/2010.  Anticoagulation instructions were given to patient.  Results were reviewed/authorized by Weston Brass, PharmD.  She was notified by Weston Brass PharmD.         Prior Anticoagulation Instructions: INR 2.5 Continue 1 tablet everyday except 1/2 tablet on Mondays, Wednesdays and Fridays. Recheck in 4 weeks.   Current Anticoagulation Instructions: INR 2.3  Continue same dose of 1 tablet every day except 1/2 tablet on Monday, Wednesday, and Friday

## 2010-10-13 NOTE — Medication Information (Signed)
Summary: Erin Rivers  Anticoagulant Therapy  Managed by: Bethena Midget, RN, BSN Referring MD: Charlies Constable MD PCP: Kriste Basque MD Supervising MD: Excell Seltzer MD, Casimiro Needle Indication 1: Atrial Fibrillation (ICD-427.31) Lab Used: [facility] Site: [facility] INR POC 4.0 INR RANGE 2 - 3  Dietary changes: yes       Details: Appetite improving   Health status changes: no    Bleeding/hemorrhagic complications: no    Recent/future hospitalizations: no    Any changes in medication regimen? yes       Details: Digoxin, Lasix and potassium are new post hosp discharge 07/22/10  Recent/future dental: no  Any missed doses?: no       Is patient compliant with meds? yes       Current Medications (verified): 1)  Coumadin 2.5 Mg Tabs (Warfarin Sodium) .... Take As Directed By Coumadin Clinic. 2)  Aspirin 81 Mg Tabs (Aspirin) .... Take 1 Tablet By Mouth Once A Day 3)  Digoxin 0.125 Mg Tabs (Digoxin) .... Take 1 Tablet By Mouth Once A Day 4)  Metoprolol Tartrate 50 Mg  Tabs (Metoprolol Tartrate) .... Take 1 Tab By Mouth Two Times A Day... 5)  Furosemide 40 Mg Tabs (Furosemide) .... Take 1 Tab By Mouth Once Daily in Am... 6)  Klor-Con M20 20 Meq Cr-Tabs (Potassium Chloride Crys Cr) .... Take One Tablet By Mouth Once Daily and 1/2 Tablet in Evening While Taking The Lasix 7)  Zocor 40 Mg Tabs (Simvastatin) .... Take 1 Tablet By Mouth Once A Day 8)  Synthroid 75 Mcg Tabs (Levothyroxine Sodium) .... Take 1 Tablet By Mouth Once A Day 9)  Boniva 150 Mg Tabs (Ibandronate Sodium) .... Take One Tablet By Mouth Once Every Month 10)  Caltrate 600+d 600-400 Mg-Unit  Tabs (Calcium Carbonate-Vitamin D) .... Take 1 Tab By Mouth Two Times A Day... 11)  Multivitamins   Tabs (Multiple Vitamin) .... Take 1 Tab By Mouth Once Daily... 12)  Vitamin C 500 Mg Tabs (Ascorbic Acid) .... Take 1 Tablet By Mouth Once A Day 13)  Vitamin D 1000 Unit Tabs (Cholecalciferol) .... Take 1 Tablet By Mouth Once A Day 14)  Vitamin E-400 400 Unit  Caps (Vitamin E) .... Take 1 Tablet By Mouth Once A Day 15)  Pro-Flora Concentrate  Caps (Probiotic Product) .... Take 1 Tablet By Mouth Once A Day  Allergies: 1)  ! Codeine  Anticoagulation Management History:      The patient is taking warfarin and comes in today for a routine follow up visit.  Positive risk factors for bleeding include an age of 75 years or older.  The bleeding index is 'intermediate risk'.  Positive CHADS2 values include History of CHF, History of HTN, and Age > 40 years old.  The start date was 03/24/2001.  Her last INR was 2.5.  Anticoagulation responsible Ahaan Zobrist: Excell Seltzer MD, Casimiro Needle.  INR POC: 4.0.  Cuvette Lot#: [id number].  Exp: 08/2011.    Anticoagulation Management Assessment/Plan:      The patient's current anticoagulation dose is Coumadin 2.5 mg tabs: Take as directed by coumadin clinic..  The target INR is 2.0-3.0.  The next INR is due 08/28/2010.  Anticoagulation instructions were given to patient.  Results were reviewed/authorized by Bethena Midget, RN, BSN.  She was notified by Bethena Midget, RN, BSN.         Prior Anticoagulation Instructions: INR 2.6 Continue 1 pill everyday except 1/2 pill on Mondays and Fridays. Recheck in 4 weeks.   Current Anticoagulation Instructions: INR 4.0  Skip today's dose, Tomorrow only take 1/2 pill then resume 1 pill everyday except 1/2 pill on Mondays and Fridays. Recheck in 2 weeks.

## 2010-10-13 NOTE — Medication Information (Signed)
Summary: Erin Rivers  Anticoagulant Therapy  Managed by: Bethena Midget, RN, BSN Referring MD: Charlies Constable MD PCP: Kriste Basque MD Supervising MD: Daleen Squibb MD, Maisie Fus Indication 1: Atrial Fibrillation (ICD-427.31) Lab Used: [facility] Site: [facility] INR POC 2.0 INR RANGE 2 - 3  Dietary changes: no    Health status changes: no    Bleeding/hemorrhagic complications: no    Recent/future hospitalizations: no    Any changes in medication regimen? no    Recent/future dental: no  Any missed doses?: no       Is patient compliant with meds? yes       Allergies: 1)  ! Codeine  Anticoagulation Management History:      The patient is taking warfarin and comes in today for a routine follow up visit.  Positive risk factors for bleeding include an age of 75 years or older.  The bleeding index is 'intermediate risk'.  Positive CHADS2 values include History of HTN and Age > 18 years old.  The start date was 03/24/2001.  Anticoagulation responsible provider: Daleen Squibb MD, Maisie Fus.  INR POC: 2.0.  Cuvette Lot#: [id number].  Exp: 06/2011.    Anticoagulation Management Assessment/Plan:      The patient's current anticoagulation dose is Coumadin 2.5 mg tabs: Take as directed by coumadin clinic..  The target INR is 2.0-3.0.  The next INR is due 05/01/2010.  Anticoagulation instructions were given to patient.  Results were reviewed/authorized by Bethena Midget, RN, BSN.  She was notified by Dillard Cannon.         Prior Anticoagulation Instructions: INR 1.9. Take 1 tablet today, then take 1 tablet daily except 0.5 tablet on Mon, Wed, Fri. Recheck in 4 weeks.  Current Anticoagulation Instructions: INR 2.0  Take 1 tab today and then continue same dose of 1 tab daily except for 1/2 tab on Monday, Wednesday, and Friday.  Recheck in 4 weeks.

## 2010-10-13 NOTE — Progress Notes (Signed)
Summary: FYI---hospital admit  Phone Note Call from Patient Call back at Home Phone [phone]   Caller: Spouse//[email or web address] Call For: nadel Summary of Call: FYI: States he took his wife to ER yesterday [id number] breathing and she was admitted to [facility] rm 4740. Also states she was scheduled for labs and echo today and she will be having the echo done in hospital today. Initial call taken by: Darletta Moll,  July 20, 2010 8:23 AM  Follow-up for Phone Call        Will forward msg to SN so that he is aware.Michel Bickers CMA  July 20, 2010 8:55 AM   SN is aware of pt being in hospital Randell Loop CMA  July 20, 2010 1:34 PM

## 2010-10-13 NOTE — Letter (Signed)
Summary: Handout Printed  Printed Handout:  - Coumadin Instructions-w/out Meds 

## 2010-10-13 NOTE — Assessment & Plan Note (Signed)
Summary: hfu/la   Primary Care Rivers Gassmann:  Kriste Basque MD  CC:  Post hosp ROV & review....  History of Present Illness: 77 y/o WF here for a follow up visit... she has multiple medical problems including CHF w/ severe MR and chr AFib, Hyperlipidemia, Hypothyroid, Osteopenia, etc... she is followed for Cards by Walker Kehr.   ~  Jan 12, 2010:  stable on the Coumadin via CC... overall doing well w/o new problems- she would like 3 mo supply of meds & will need to ret to lab this week for fasting blood work (see prob list below)...   ~  July 16, 2010:  presents w/ some incr SOB- w/ activ & at rest, can't get a DB, sighs help, min cough, no sputum, no f/c/s, feels "tight" but no wheezing... **NOTE> CXR w/ cardiomeg & basilar changes, Labs w/ Hg=10.9, BNP=1089 >> admitted for diuresis & 2DEcho showed severe MR, mild LVH, EF 55-60%, & pulm HTN w/ PAsys=55...   ~  July 29, 2010:  post hosp check- improved after diuresis > eval revealed severe MR & pulmHTN as noted... sent home on Dig (level= 1.6 on 0.125mg /d) & Lasix 40mg /d w/ f/u BNP today = 822 & Creat= 0.9, so we will incr her Lasix to 60mg /d... they also incr her Synthroid from 50 to 51mcg/d due to TSH in the 5-7 range... heart rate today= 74/min w/ her current meds... plan f/u 35mo.    Current Problem List:  HEARING LOSS (ICD-389.9) - she wears digital hearing aides...  ALLERGIC RHINITIS (ICD-477.9) - uses OTC meds Prn...  Hx of POSITIVE PPD (ICD-795.5) - no known Tb exposure... asymptomatic without cough, sputum, hemoptysis, etc... 11/11 c/o mild dyspnea but more trouble getting air "in"...  ~  CXR 11/09 mild cardiomeg, atherosclerotic Ao, clear lungs, mild scoliosis.  ~  CXR 11/10 showed similar- cardiomeg, sl incr markings, NAD.Marland Kitchen.  ~  CXR 11/11 showed similar cardiomeg but R>L basilar airsp dis vs atx vs effusion...  MITRAL VALVE PROLAPSE (ICD-424.0) - MITRAL REGURGITATION (ICD-396.3) - ATRIAL FIBRILLATION, CHRONIC (ICD-427.31) - she was  followed by Walker Kehr for Cardiology- his notes are reviewed... AFib dx in 2002 treated w/ rate control on METOPROLOL 50mg Bid, and COUMADIN- followed in the coumadin clinic... l2DEcho 6/08 showed bileaflet MVP w/ mod to severe MR...   ~  5/11:  she remains asymptomatic without CP, palpit, dizziness, syncope, dyspnea, or edema... no symptoms of TIA/ stroke... she remains very active and doing well- walks >30mi per day...  ~  11/11:  presents w/ some incr SOB- w/ activ & at rest, can't get a DB, sighs help, min cough, no sputum, no f/c/s, feels "tight" but no wheezing... **NOTE> CXR w/ cardiomeg & basilar edema, Labs w/ Hg=10.9, BNP>1000... Adm for diuresis- 2DEcho showed mild LVH, EF=55-60%, no regional wall motion abn, severe MR, pulmHTN w/ PAsys=55... disch on DIGOXIN 0.125mg /d & LASIX 40mg /d...  ~  Post hosp check:  improved overall, Dig= 1.6, BNP= 822, Creat= 0.9> rec incr Lasix to 60mg /d.  HYPERLIPIDEMIA (ICD-272.4) - on SIMVASTATIN 40mg /d...  ~  FLP 5/09 showed TChol 181, TG 95, HDL 54, LDL 108... same med, & low fat diet.  ~  FLP 5/10 showed TChol 168, TG 86, HDL 57, LDL 94  ~  FLP 5/11 showed TChol 170, TG 94, HDL 57, LDL 94  HYPOTHYROIDISM (ICD-244.9) - now on SYNTHROID 68mcg/d... clinically euthyroid & asymptomatic...  ~  labs 5/09 showed TSH= 3.71... continue same med.  ~  labs 5/10 showed TSH=  3.50  ~  labs 5/11 showed TSH= 5.36  ~  labs 11/11 showed TSH= 5.43  ~  labs in hosp showed TSH= 7.77 but FreeT3 & FreeT4 were WNL, they incr Synthroid to 58mcg/d anyway & we will watch HR & recheck.  LOW BACK PAIN, CHRONIC (ICD-724.2) - she has seen a chiropractor in the past, and used Vicodin, Tramadol... currently doing satis w/ her exercise program and using OTC meds only as needed.  OSTEOPOROSIS (ICD-733.00) - prev on Actonel 35mg /wk, but changed to BONIVA 150mg /mo per new insurance co ([employer]); plus Calcium/ Vits/ etc...  ~  BMD 11/07 showed TScores -1.5 sp, and -1.3 hips... sl  improved from 2004 on rx.  ~  BMD 12/09 showed TScores -1.8 Spine, & -1.0 left FemNeck... continue Boniva.  ANEMIA (ICD-285.9)  ~  labs 5/10 showed Hg= 12.5, MCV= 102  ~  labs 5/11 showed Hg= 12.5, MCV= 99  ~  11/11:  presented w/ dyspnea & Hg=10.9 ?etiology, noanemia w/u done in hosp as f/u labs showed Hg=12-13.  SHINGLES, HX OF (ICD-V13.8) - we briefly discussed the shingles vaccine...  Health Maint:  she has never had a colonoscopy (now in her 26's & on Coumadin); no hx of GI problems & Hg= 13.0 (5/09) & 12.5 (5/10)... she gets the seasonal Flu vaccine yearly.   Preventive Screening-Counseling & Management  Alcohol-Tobacco     Smoking Status: quit     Year Quit: 1970  Allergies: 1)  ! Codeine  Comments:  Nurse/Medical Assistant: The patient's medications and allergies were reviewed with the patient and were updated in the Medication and Allergy Lists.  Past History:  Past Medical History: HEARING LOSS (ICD-389.9) ALLERGIC RHINITIS (ICD-477.9) Hx of POSITIVE PPD (ICD-795.5) MITRAL VALVE PROLAPSE (ICD-424.0) MITRAL REGURGITATION (ICD-396.3) ATRIAL FIBRILLATION, CHRONIC (ICD-427.31) HYPERLIPIDEMIA (ICD-272.4) HYPOTHYROIDISM (ICD-244.9) LOW BACK PAIN, CHRONIC (ICD-724.2) OSTEOPOROSIS (ICD-733.00) ANEMIA (ICD-285.9) SHINGLES, HX OF (ICD-V13.8)  Past Surgical History: S/P myringotomies  Family History: Reviewed history from 07/16/2010 and no changes required. Mother deceased age 59  from a massive stroke Father deceased age 85 from ?cancer- heavy smoker 2 Siblings: both brothers- one died age 65 from pulm embolism after surg, & the other died age 48 from MI...  Social History: Reviewed history from 07/16/2010 and no changes required. married exsmoker  quit in 1970 smoked for 10 years  1 cig per day drinks 1 glass of wine with dinner daily 2 daughters with no significant medical hx  Review of Systems      See HPI       The patient complains of decreased  hearing, dyspnea on exertion, and muscle weakness.  The patient denies anorexia, fever, weight loss, weight gain, vision loss, hoarseness, chest pain, syncope, peripheral edema, prolonged cough, headaches, hemoptysis, abdominal pain, melena, hematochezia, severe indigestion/heartburn, hematuria, incontinence, suspicious skin lesions, transient blindness, difficulty walking, depression, unusual weight change, abnormal bleeding, enlarged lymph nodes, and angioedema.    Vital Signs:  Patient profile:   77 year old female Height:      62 inches Weight:      115.50 pounds BMI:     21.20 O2 Sat:      95 % on Room air Temp:     97.8 degrees F oral Pulse rate:   72 / minute BP sitting:   122 / 74  (left arm) Cuff size:   regular  Vitals Entered By: Randell Loop CMA (July 29, 2010 4:19 PM)  O2 Sat at Rest %:  95 O2 Flow:  Room air CC: Post hosp ROV & review... Is Patient Diabetic? No Pain Assessment Patient in pain? no      Comments meds updated today with pt   Physical Exam  Additional Exam:  WD, WN, 77 y/o WF in NAD... GENERAL:  Alert & oriented; pleasant & cooperative... HEENT:  [place]/AT, EOM-wnl, PERRLA, EACs-clear, TMs-wnl, NOSE-clear, THROAT-clear & wnl. NECK:  Supple w/ fairROM; no JVD; normal carotid impulses w/o bruits; no thyromegaly or nodules palpated; no lymphadenopathy. CHEST:  Decr BS bilat w/ few basilar rales & scat rhonchi, not coughing, no wheezing, no consolidation. HEART:  irreg AFib w/ control VR; gr 3/6 sys murmur at apex, no rubs or gallops... ABDOMEN:  Soft & nontender; normal bowel sounds; no organomegaly or masses detected. EXT: without deformities, mild arthritic changes; no varicose veins/ venous insuffic/ or edema. NEURO:  CN's intact; motor testing normal; sensory testing normal; gait normal & balance OK. DERM:  No lesions noted; no rash etc...    MISC. Report  Procedure date:  07/29/2010  Findings:      DATA REVIEWED:  Hosp records including  H&P, DCSummary, XRays, Labs, 2DEcho, EKG, etc...  BMP (METABOL)   Sodium               [L]  133 mEq/L                   135-145   Potassium            [H]  5.2 mEq/L                   3.5-5.1   Chloride                  98 mEq/L                    96-112   Carbon Dioxide            30 mEq/L                    19-32   Glucose                   93 mg/dL                    60-45   BUN                       21 mg/dL                    4-09   Creatinine                0.9 mg/dL                   8.1-1.9   Calcium                   10.2 mg/dL                  1.4-78.2   GFR                       61.33 mL/min                >60  B-Type Natiuretic Peptide (BNPR)  B-Type Natriuetic Peptide                        [  H]  821.9 pg/mL                 0.0-100.0  Digoxin (57846)   Digoxin                   1.6 ng/mL                   0.8-2.0   Impression & Recommendations:  Problem # 1:  ACUTE ON CHRONIC DIASTOLIC HEART FAILURE (ICD-428.33) We decided to incr the Lasix from 40mg /d to 60mg /d w/ ROV 43mo... Her updated medication list for this problem includes:    Coumadin 2.5 Mg Tabs (Warfarin sodium) .Marland Kitchen... Take as directed by coumadin clinic.    Aspirin 81 Mg Tabs (Aspirin) .Marland Kitchen... Take 1 tablet by mouth once a day    Digoxin 0.125 Mg Tabs (Digoxin) .Marland Kitchen... Take 1 tablet by mouth once a day    Metoprolol Tartrate 50 Mg Tabs (Metoprolol tartrate) .Marland Kitchen... Take 1 tab by mouth two times a day...    Furosemide 40 Mg Tabs (Furosemide) .Marland Kitchen... Take 1 tab by mouth once daily in am...  Orders: T-Digoxin (96295-28413) TLB-BMP (Basic Metabolic Panel-BMET) (80048-METABOL) TLB-BNP (B-Natriuretic Peptide) (83880-BNPR)  Problem # 2:  DYSPNEA (ICD-786.05) Improved w/ diuresis...  Problem # 3:  MITRAL REGURGITATION (ICD-396.3) Severe MR followed by [facility] [employer]... Her updated medication list for this problem includes:    Coumadin 2.5 Mg Tabs (Warfarin sodium) .Marland Kitchen... Take as directed by coumadin clinic.    Aspirin  81 Mg Tabs (Aspirin) .Marland Kitchen... Take 1 tablet by mouth once a day    Metoprolol Tartrate 50 Mg Tabs (Metoprolol tartrate) .Marland Kitchen... Take 1 tab by mouth two times a day...  Problem # 4:  ATRIAL FIBRILLATION, CHRONIC (ICD-427.31) Controlled VR on Dig & Metoprolol... contin same. Her updated medication list for this problem includes:    Coumadin 2.5 Mg Tabs (Warfarin sodium) .Marland Kitchen... Take as directed by coumadin clinic.    Aspirin 81 Mg Tabs (Aspirin) .Marland Kitchen... Take 1 tablet by mouth once a day    Digoxin 0.125 Mg Tabs (Digoxin) .Marland Kitchen... Take 1 tablet by mouth once a day    Metoprolol Tartrate 50 Mg Tabs (Metoprolol tartrate) .Marland Kitchen... Take 1 tab by mouth two times a day...  Problem # 5:  HYPOTHYROIDISM (ICD-244.9) Dose was incr in hosp to 46mcg/d... watch pulse rate etc... Her updated medication list for this problem includes:    Synthroid 75 Mcg Tabs (Levothyroxine sodium) .Marland Kitchen... Take 1 tablet by mouth once a day  Problem # 6:  OTHER MEDICAL PROBLEMS AS NOTED>>>  Complete Medication List: 1)  Coumadin 2.5 Mg Tabs (Warfarin sodium) .... Take as directed by coumadin clinic. 2)  Aspirin 81 Mg Tabs (Aspirin) .... Take 1 tablet by mouth once a day 3)  Digoxin 0.125 Mg Tabs (Digoxin) .... Take 1 tablet by mouth once a day 4)  Metoprolol Tartrate 50 Mg Tabs (Metoprolol tartrate) .... Take 1 tab by mouth two times a day... 5)  Furosemide 40 Mg Tabs (Furosemide) .... Take 1 tab by mouth once daily in am... 6)  Klor-con M20 20 Meq Cr-tabs (Potassium chloride crys cr) .... Take one tablet by mouth once daily while taking the lasix 7)  Zocor 40 Mg Tabs (Simvastatin) .... Take 1 tablet by mouth once a day 8)  Synthroid 75 Mcg Tabs (Levothyroxine sodium) .... Take 1 tablet by mouth once a day 9)  Boniva 150 Mg Tabs (Ibandronate sodium) .... Take one tablet by mouth once every month  10)  Caltrate 600+d 600-400 Mg-unit Tabs (Calcium carbonate-vitamin d) .... Take 1 tab by mouth two times a day... 11)  Multivitamins Tabs  (Multiple vitamin) .... Take 1 tab by mouth once daily... 12)  Vitamin C 500 Mg Tabs (Ascorbic acid) .... Take 1 tablet by mouth once a day 13)  Vitamin D 1000 Unit Tabs (Cholecalciferol) .... Take 1 tablet by mouth once a day 14)  Vitamin E-400 400 Unit Caps (Vitamin e) .... Take 1 tablet by mouth once a day 15)  Pro-flora Concentrate Caps (Probiotic product) .... Take 1 tablet by mouth once a day  Patient Instructions: 1)  Today we updated your med list- see below.... 2)  We refilled your meds as noted... 3)  Today we rechecked your blood work... please call the "phone tree" in a few days for your lab results.Marland KitchenMarland Kitchen 4)  Gradually increase your exercise at home... 5)  Call for any problems.Marland KitchenMarland Kitchen 6)  Please schedule a follow-up appointment in 1 month. Prescriptions: SYNTHROID 75 MCG TABS (LEVOTHYROXINE SODIUM) Take 1 tablet by mouth once a day  #90 x 4   Entered and Authorized by:   Michele Mcalpine MD   Signed by:   Michele Mcalpine MD on 07/29/2010   Method used:   Print then Give to Patient   RxID:   [id number] KLOR-CON M20 20 MEQ CR-TABS (POTASSIUM CHLORIDE CRYS CR) take one tablet by mouth once daily while taking the lasix  #90 x 4   Entered and Authorized by:   Michele Mcalpine MD   Signed by:   Michele Mcalpine MD on 07/29/2010   Method used:   Print then Give to Patient   RxID:   [id number] FUROSEMIDE 40 MG TABS (FUROSEMIDE) take 1 tab by mouth once daily in AM...  #90 x 4   Entered and Authorized by:   Michele Mcalpine MD   Signed by:   Michele Mcalpine MD on 07/29/2010   Method used:   Print then Give to Patient   RxID:   [id number] DIGOXIN 0.125 MG TABS (DIGOXIN) Take 1 tablet by mouth once a day  #90 x 4   Entered and Authorized by:   Michele Mcalpine MD   Signed by:   Michele Mcalpine MD on 07/29/2010   Method used:   Print then Give to Patient   RxID:   [id number]

## 2010-10-13 NOTE — Medication Information (Signed)
Summary: rov/sp  Anticoagulant Therapy  Managed by: Erin Rivers, PharmD Referring MD: Erin Constable MD PCP: Erin Basque MD Supervising MD: Erin Som MD, Erin Rivers Indication 1: Atrial Fibrillation (ICD-427.31) Lab Used: [facility] Site: [facility] INR POC 2.8 INR RANGE 2 - 3  Dietary changes: no    Health status changes: no    Bleeding/hemorrhagic complications: no    Recent/future hospitalizations: no    Any changes in medication regimen? no    Recent/future dental: no  Any missed doses?: no       Is patient compliant with meds? yes       Allergies: 1)  ! Codeine  Anticoagulation Management History:      The patient is taking warfarin and comes in today for a routine follow up visit.  Positive risk factors for bleeding include an age of 75 years or older.  The bleeding index is 'intermediate risk'.  Positive CHADS2 values include History of HTN and Age > 75 years old.  The start date was 03/24/2001.  Anticoagulation responsible provider: Jens Som MD, Erin Rivers.  INR POC: 2.8.  Cuvette Lot#: [id number].  Exp: 02/2011.    Anticoagulation Management Assessment/Plan:      The patient's current anticoagulation dose is Coumadin 2.5 mg tabs: Take as directed by coumadin clinic..  The target INR is 2.0-3.0.  The next INR is due 02/13/2010.  Anticoagulation instructions were given to patient.  Results were reviewed/authorized by Erin Rivers, PharmD.  She was notified by Erin Rivers Pharm.         Prior Anticoagulation Instructions: INR 2.3  Continue same dose of 1 tablet every day except 1/2 tablet on Monday, Wednesday, and Friday   Current Anticoagulation Instructions: INR-2.8 Continue on normal schedule. Take 1 tablet every day except 1/2 a tablet on Monday , Wednesday and Friday of each week.Return in 4 weeks.

## 2010-10-13 NOTE — Progress Notes (Signed)
Summary: diarrhea from abx  Phone Note Call from Patient   Caller: Patient Call For: nadel Summary of Call: pt thinks amoxillian is causing diarrhea. [facility] [address] Initial call taken by: Rickard Patience,  July 27, 2010 9:05 AM  Follow-up for Phone Call        Called, spoke with pt's husband, Louanne Skye.  Pt was d/c'd on augmentin.  Since Thursday morning pt having diarhea.  Taking med with food.  Has 3 days left of augmentin.  Requesting SN's recs.   Allergies: Codiene [facility] [address] Follow-up by: Gweneth Dimitri RN,  July 27, 2010 9:58 AM  Additional Follow-up for Phone Call Additional follow up Details #1::        per SN----ok to stop the augmentin---take activia yogurt once daily, align probiotic once daily and ummodium otc 1 every 4-6 hours as needed for watery diarrhea.  thanks Randell Loop CMA  July 27, 2010 10:49 AM     Additional Follow-up for Phone Call Additional follow up Details #2::    Called, spoke with pt's husband.  He was informed of above recs per SN and verbalized understanding.  WCB is sxs do not improve or worsen.  Gweneth Dimitri RN  July 27, 2010 11:09 AM

## 2010-10-13 NOTE — Progress Notes (Signed)
Summary: speak to nurse- pt in lobby  Phone Note Call from Patient   Caller: Spouse-richard Call For: nadel Summary of Call: pt's spouse is in lobby now. he requests to speak to nurse re: salt sub for pt. (leigh is aware).  Initial call taken by: Tivis Ringer, CNA,  August 03, 2010 11:31 AM  Follow-up for Phone Call        went out to speak to pts husband----he was asking about her salt sub---explained to him that she should not stop her potassium pills due to the potassium in the salt sub.  he voiced his understanding of this and pt will cont her meds the same Randell Loop CMA  August 03, 2010 11:35 AM

## 2010-10-13 NOTE — Progress Notes (Signed)
Summary: appt   Phone Note From Other Clinic   Caller: coumadin clinic/[address] Call For: nadel Summary of Call: Coumadin clinic called for pt and stated pt was given a f/u appt at her last ov for 12/13 @ 11 but appt is not noted in idx, pt can be reached at [phone] in ref to this. Initial call taken by: Darletta Moll,  August 14, 2010 11:56 AM  Follow-up for Phone Call        [facility]  Crystal Yetta Barre RN  August 14, 2010 2:47 PM pt returned call from traige. Tivis Ringer, CNA  August 14, 2010 3:43 PM  Spoke with pt.  She states she was told by someone at last OV to come in on 08/25/10 at 11am to follow up with SN but states coumadin clinic told her this appt not in system.  Apolgized for this miscommunication and rescheduled OV for 08/31/10 at 3pm with SN -- ok by TD.  Pt aware.  Follow-up by: Gweneth Dimitri RN,  August 14, 2010 3:54 PM

## 2010-10-13 NOTE — Medication Information (Signed)
Summary: rov.m  Anticoagulant Therapy  Managed by: Bethena Midget, RN, BSN Referring MD: Charlies Constable MD Supervising MD: Antoine Poche MD, Fayrene Fearing Indication 1: Atrial Fibrillation (ICD-427.31) Lab Used: [facility] Site: [facility] INR POC 2.5 INR RANGE 2 - 3  Dietary changes: no    Health status changes: no    Bleeding/hemorrhagic complications: yes       Details: occ. nosebleed  Recent/future hospitalizations: no    Any changes in medication regimen? no    Recent/future dental: no  Any missed doses?: no       Is patient compliant with meds? yes       Allergies: 1)  ! Codeine  Anticoagulation Management History:      The patient is taking warfarin and comes in today for a routine follow up visit.  Positive risk factors for bleeding include an age of 75 years or older.  The bleeding index is 'intermediate risk'.  Positive CHADS2 values include History of HTN and Age > 52 years old.  The start date was 03/24/2001.  Anticoagulation responsible provider: Antoine Poche MD, Fayrene Fearing.  INR POC: 2.5.  Cuvette Lot#: [id number].  Exp: 01/2011.    Anticoagulation Management Assessment/Plan:      The patient's current anticoagulation dose is Coumadin 2.5 mg tabs: Take as directed by coumadin clinic..  The target INR is 2.0-3.0.  The next INR is due 12/19/2009.  Anticoagulation instructions were given to patient.  Results were reviewed/authorized by Bethena Midget, RN, BSN.  She was notified by Bethena Midget, RN, BSN.         Prior Anticoagulation Instructions: INR 2.6  Continue 1 tab daily except take 0.5 tab on Mondays, Wednesdays, Fridays.   Recheck in 4 weeks.    Current Anticoagulation Instructions: INR 2.5 Continue 1 tablet everyday except 1/2 tablet on Mondays, Wednesdays and Fridays. Recheck in 4 weeks.

## 2010-10-13 NOTE — Medication Information (Signed)
Summary: rov/ewj  Anticoagulant Therapy  Managed by: Bethena Midget, RN, BSN Referring MD: Charlies Constable MD Supervising MD: Antoine Poche MD, Fayrene Fearing Indication 1: Atrial Fibrillation (ICD-427.31) Lab Used: [facility] Site: [facility] INR POC 3.2 INR RANGE 2 - 3   Health status changes: no    Bleeding/hemorrhagic complications: no    Recent/future hospitalizations: no    Any changes in medication regimen? no    Recent/future dental: no  Any missed doses?: no       Is patient compliant with meds? yes       Allergies: 1)  ! Codeine  Anticoagulation Management History:      The patient is taking warfarin and comes in today for a routine follow up visit.  Positive risk factors for bleeding include an age of 75 years or older.  The bleeding index is 'intermediate risk'.  Positive CHADS2 values include History of HTN and Age > 75 years old.  The start date was 03/24/2001.  Anticoagulation responsible provider: Antoine Poche MD, Fayrene Fearing.  INR POC: 3.2.  Cuvette Lot#: [id number].  Exp: 12/2010.    Anticoagulation Management Assessment/Plan:      The patient's current anticoagulation dose is Coumadin 2.5 mg tabs: Take as directed by coumadin clinic..  The target INR is 2.0-3.0.  The next INR is due 10/24/2009.  Anticoagulation instructions were given to patient.  Results were reviewed/authorized by Bethena Midget, RN, BSN.  She was notified by Ysidro Evert, Pharm D Candidate.         Prior Anticoagulation Instructions: INR 2.1  Continue on same dosage 1 tablet daily except 1/2 tablet on Mondays, Wednesdays, and Fridays.  Recheck in 4 weeks.    Current Anticoagulation Instructions: INR: 3.2 Skip today's dose then resume to same dosage of 1 tablet daily except 1/2 tablet on Monday, Wednesdays and Fridays Recheck in 3 weeks

## 2010-10-13 NOTE — Progress Notes (Signed)
Summary: 7 day supply of metoprolol  Phone Note From Pharmacy Call back at [phone]   Caller: Denise[facility] Call For: Kriste Basque  Summary of Call: Caller advised Metoprolol Tartrate 50mg  will not arrive to pt's home in time and requested that we call [facility] [address] to cover 7 day supply. #14 x 0 refills call to pharmacy, spoke to [place].  Initial call taken by: Zackery Barefoot CMA,  August 20, 2010 10:19 AM

## 2010-10-13 NOTE — Medication Information (Signed)
Summary: rov/kb  Anticoagulant Therapy  Managed by: Weston Brass, PharmD Referring MD: Charlies Constable MD PCP: Kriste Basque MD Supervising MD: Myrtis Ser MD, Tinnie Gens Indication 1: Atrial Fibrillation (ICD-427.31) Lab Used: [facility] Site: [facility] INR POC 2.1 INR RANGE 2 - 3  Dietary changes: no    Health status changes: no    Bleeding/hemorrhagic complications: no    Recent/future hospitalizations: no    Any changes in medication regimen? yes       Details: added vitamin d 1000 units daily   Recent/future dental: no  Any missed doses?: no       Is patient compliant with meds? yes       Allergies: 1)  ! Codeine  Anticoagulation Management History:      The patient is taking warfarin and comes in today for a routine follow up visit.  Positive risk factors for bleeding include an age of 75 years or older.  The bleeding index is 'intermediate risk'.  Positive CHADS2 values include History of HTN and Age > 44 years old.  The start date was 03/24/2001.  Anticoagulation responsible provider: Myrtis Ser MD, Tinnie Gens.  INR POC: 2.1.  Cuvette Lot#: [id number].  Exp: 04/2011.    Anticoagulation Management Assessment/Plan:      The patient's current anticoagulation dose is Coumadin 2.5 mg tabs: Take as directed by coumadin clinic..  The target INR is 2.0-3.0.  The next INR is due 03/06/2010.  Anticoagulation instructions were given to patient.  Results were reviewed/authorized by Weston Brass, PharmD.  She was notified by Weston Brass PharmD.         Prior Anticoagulation Instructions: INR-2.8 Continue on normal schedule. Take 1 tablet every day except 1/2 a tablet on Monday , Wednesday and Friday of each week.Return in 4 weeks.  Current Anticoagulation Instructions: INR 2.1  Continue same dose of 1 tablet every day except 1/2 tablet on Monday, Wednesday and Friday

## 2010-10-13 NOTE — Medication Information (Signed)
Summary: rov/cs  Anticoagulant Therapy  Managed by: Weston Brass, PharmD Referring MD: Charlies Constable MD PCP: Kriste Basque MD Supervising MD: Juanda Chance MD, Bruce Indication 1: Atrial Fibrillation (ICD-427.31) Lab Used: [facility] Site: [facility] INR RANGE 2 - 3  Dietary changes: no    Health status changes: no    Bleeding/hemorrhagic complications: no    Recent/future hospitalizations: no    Any changes in medication regimen? no    Recent/future dental: no  Any missed doses?: no       Is patient compliant with meds? yes       Allergies: 1)  ! Codeine  Anticoagulation Management History:      The patient is taking warfarin and comes in today for a routine follow up visit.  Positive risk factors for bleeding include an age of 38 years or older.  The bleeding index is 'intermediate risk'.  Positive CHADS2 values include History of HTN and Age > 19 years old.  The start date was 03/24/2001.  Today's INR is 2.5.  Anticoagulation responsible provider: Juanda Chance MD, Smitty Cords.  Cuvette Lot#: [id number].  Exp: 07/2011.    Anticoagulation Management Assessment/Plan:      The patient's current anticoagulation dose is Coumadin 2.5 mg tabs: Take as directed by coumadin clinic..  The target INR is 2.0-3.0.  The next INR is due 07/17/2010.  Anticoagulation instructions were given to patient.  Results were reviewed/authorized by Weston Brass, PharmD.  She was notified by Haynes Hoehn, PharmD Candidate.         Prior Anticoagulation Instructions: INR 2.8  Continue taking one tablet every day except one-half tablet on Monday and Friday.  We will see you in 4 weeks.  Current Anticoagulation Instructions: INR 2.5  Continue Coumadin as scheduled:  1 tablet every day of the week except 1/2 tablet on Monday and Friday.    Return to clinic in 4 weeks.

## 2010-10-13 NOTE — Assessment & Plan Note (Signed)
Summary: rov 6 months///kp   CC:  6 month ROV & review of mult medical problems....  History of Present Illness: 77 y/o WF here for a follow up visit...    ~  May10:  she is doing extremely well for 66 and has no new complaints or concerns today...  she had right hand tendon surgery 4/10 by DrOrtman- symptoms improved... she will be traveling to [place] ([place] home= [place])...   ~  July 16, 2009:  had great trip to [place] w/o any problems... feeling well & no new complaints... stable on current med Rx (see below)... she's already had her 2010 flu vaccine...   ~  Jan 12, 2010:  stable on the Coumadin via [facility]... overall doing well w/o new problems- she would like 3 mo supply of meds & will need to ret to lab this week for fasting blood work (see prob list below)...    Current Problem List:  HEARING LOSS (ICD-389.9) - she wears digital hearing aides...  ALLERGIC RHINITIS (ICD-477.9) - uses OTC meds Prn...  Hx of POSITIVE PPD (ICD-795.5) - no known Tb exposure... asymptomatic without cough, sputum, hemoptysis, dyspnea, etc...   ~  CXR 11/09 mild cardiomeg, atherosclerotic Ao, clear lungs, mild scoliosis.  ~  f/u CXR 11/10 showed similar- cardiomeg, sl incr markings, NAD...  MITRAL VALVE PROLAPSE (ICD-424.0) - MITRAL REGURGITATION (ICD-396.3) - ATRIAL FIBRILLATION, CHRONIC (ICD-427.31) - she is followed by Walker Kehr for Cardiology- his notes are reviewed... AFib dx in 2002 treated w/ rate control on METOPROLOL 50mg Bid, and COUMADIN- followed in the coumadin clinic... last 2DEcho 6/08 showed bileaflet MVP w/ mod to severe MR... she remains asymptomatic without CP, palpit, dizziness, syncope, dyspnea, or edema... no symptoms of TIA/ stroke... she remains very active and doing well- walks >58mi per day...   HYPERLIPIDEMIA (ICD-272.4) - on SIMVASTATIN 40mg /d...  ~  FLP 5/09 showed TChol 181, TG 95, HDL 54, LDL 108... same med, & low fat diet.  ~  FLP 5/10 showed TChol 168, TG  86, HDL 57, LDL 94  ~  FLP 5/11 showed TChol   HYPOTHYROIDISM (ICD-244.9) - on SYNTHROID 87mcg/d... clinically euthyroid & asymptomatic...  ~  labs 5/09 showed TSH= 3.71... continue same med.  ~  labs 5/10 showed TSH= 3.50  ~  labs 5/11 showed TSH=   LOW BACK PAIN, CHRONIC (ICD-724.2) - she has seen a chiropractor in the past, and used Vicodin, Tramadol... currently doing satis w/ her exercise program and using OTC meds only as needed.  OSTEOPOROSIS (ICD-733.00) - prev on Actonel 35mg /wk, but changed to BONIVA 150mg /mo per new insurance co ([employer]); plus Calcium/ Vits/ etc...  ~  BMD 11/07 showed TScores -1.5 sp, and -1.3 hips... sl improved from 2004 on rx.  ~  BMD 12/09 showed TScores -1.8 Spine, & -1.0 left FemNeck... continue Boniva.  SHINGLES, HX OF (ICD-V13.8) - we briefly discussed the shingles vaccine...  Health Maint:  she has never had a colonoscopy (now in her 13's & on Coumadin); no hx of GI problems & Hg= 13.0 (5/09) & 12.5 (5/10)... she had her seasonal flu shot recently...   Allergies: 1)  ! Codeine  Comments:  Nurse/Medical Assistant: The patient's medications and allergies were reviewed with the patient and were updated in the Medication and Allergy Lists.  Past History:  Past Medical History: HEARING LOSS (ICD-389.9) ALLERGIC RHINITIS (ICD-477.9) Hx of POSITIVE PPD (ICD-795.5) MITRAL VALVE PROLAPSE (ICD-424.0) MITRAL REGURGITATION (ICD-396.3) ATRIAL FIBRILLATION, CHRONIC (ICD-427.31) HYPERLIPIDEMIA (ICD-272.4) HYPOTHYROIDISM (ICD-244.9)  LOW BACK PAIN, CHRONIC (ICD-724.2) OSTEOPOROSIS (ICD-733.00) SHINGLES, HX OF (ICD-V13.8)  Past Surgical History: S/P myringotomies  Family History: Reviewed history from 07/16/2009 and no changes required. Mother deceased age 75  from a massive stroke Father deceased age 87 from ?cancer- heavy smoker 2 Siblings: both brothers- one died age 9 from pulm embolism after surg, & the other died age 57 from  MI...  Social History: Reviewed history from 07/16/2009 and no changes required. married exsmoker  quit in 1970 smoked for 10 years  1 cig per day drinks 1 glass of wine with dinner daily 2 daughters with no significant medical hx  Review of Systems      See HPI  The patient denies anorexia, fever, weight loss, weight gain, vision loss, decreased hearing, hoarseness, chest pain, syncope, dyspnea on exertion, peripheral edema, prolonged cough, headaches, hemoptysis, abdominal pain, melena, hematochezia, severe indigestion/heartburn, hematuria, incontinence, muscle weakness, suspicious skin lesions, transient blindness, difficulty walking, depression, unusual weight change, abnormal bleeding, enlarged lymph nodes, and angioedema.    Vital Signs:  Patient profile:   77 year old female Height:      62 inches Weight:      122.13 pounds BMI:     22.42 O2 Sat:      96 % on Room air Temp:     97.6 degrees F oral Pulse rate:   85 / minute BP sitting:   116 / 84  (right arm) Cuff size:   regular  Vitals Entered By: Randell Loop CMA (Jan 12, 2010 11:53 AM)  O2 Sat at Rest %:  96 O2 Flow:  Room air CC: 6 month ROV & review of mult medical problems... Is Patient Diabetic? No Pain Assessment Patient in pain? no      Comments no changes in meds   Physical Exam  Additional Exam:  WD, WN, 77 y/o WF in NAD... GENERAL:  Alert & oriented; pleasant & cooperative... HEENT:  [place]/AT, EOM-wnl, PERRLA, EACs-clear, TMs-wnl, NOSE-clear, THROAT-clear & wnl. NECK:  Supple w/ fairROM; no JVD; normal carotid impulses w/o bruits; no thyromegaly or nodules palpated; no lymphadenopathy. CHEST:  Clear to P & A; without wheezes/ rales/ or rhonchi. HEART:  irreg AFib w/ control VR; gr 3/6 sys murmur at apex, no rubs or gallops... ABDOMEN:  Soft & nontender; normal bowel sounds; no organomegaly or masses detected. EXT: without deformities, mild arthritic changes; no varicose veins/ venous insuffic/ or  edema. NEURO:  CN's intact; motor testing normal; sensory testing normal; gait normal & balance OK. DERM:  No lesions noted; no rash etc...    MISC. Report  Procedure date:  01/12/2010  Findings:      SHE WILL RETURN TO OUR LAB ONE MORNING THIS WEEK FOR HER FASTING BLOOD WORK>>> we will notify her of the results.   Impression & Recommendations:  Problem # 1:  MITRAL VALVE PROLAPSE (ICD-424.0) Stable cardiac exam & MR murmur w/o change... Her updated medication list for this problem includes:    Metoprolol Tartrate 50 Mg Tabs (Metoprolol tartrate) .Marland Kitchen... Take 1 tab by mouth two times a day...    Coumadin 2.5 Mg Tabs (Warfarin sodium) .Marland Kitchen... Take as directed by coumadin clinic.  Orders: Prescription Created Electronically [phone])  Problem # 2:  UNSPECIFIED ESSENTIAL HYPERTENSION (ICD-401.9) Controlled on med... continue same... Her updated medication list for this problem includes:    Metoprolol Tartrate 50 Mg Tabs (Metoprolol tartrate) .Marland Kitchen... Take 1 tab by mouth two times a day...  Problem # 3:  ATRIAL  FIBRILLATION, CHRONIC (ICD-427.31) Followed in the Coumadin Clinic... stable rate on Metop etc... Her updated medication list for this problem includes:    Metoprolol Tartrate 50 Mg Tabs (Metoprolol tartrate) .Marland Kitchen... Take 1 tab by mouth two times a day...    Coumadin 2.5 Mg Tabs (Warfarin sodium) .Marland Kitchen... Take as directed by coumadin clinic.  Problem # 4:  HYPERLIPIDEMIA (ICD-272.4) She will ret to our lab one morning this week... Her updated medication list for this problem includes:    Zocor 40 Mg Tabs (Simvastatin) .Marland Kitchen... Take 1 tablet by mouth once a day  Problem # 5:  HYPOTHYROIDISM (ICD-244.9) Clinically euthyroid-  f/u labs pending. Her updated medication list for this problem includes:    Synthroid 50 Mcg Tabs (Levothyroxine sodium) .Marland Kitchen... Take 1 tablet by mouth once a day  Problem # 6:  OSTEOPOROSIS (ICD-733.00) Stable on Boniva, calcium, MVI, Vit D... Her updated  medication list for this problem includes:    Boniva 150 Mg Tabs (Ibandronate sodium) .Marland Kitchen... Take one tablet by mouth once every month  Problem # 7:  OTHER MEDICAL PROBLEMS AS NOTED>>>  Complete Medication List: 1)  Metoprolol Tartrate 50 Mg Tabs (Metoprolol tartrate) .... Take 1 tab by mouth two times a day... 2)  Zocor 40 Mg Tabs (Simvastatin) .... Take 1 tablet by mouth once a day 3)  Synthroid 50 Mcg Tabs (Levothyroxine sodium) .... Take 1 tablet by mouth once a day 4)  Boniva 150 Mg Tabs (Ibandronate sodium) .... Take one tablet by mouth once every month 5)  Caltrate 600+d 600-400 Mg-unit Tabs (Calcium carbonate-vitamin d) .... Take 1 tab by mouth two times a day... 6)  Multivitamins Tabs (Multiple vitamin) .... Take 1 tab by mouth once daily.Marland KitchenMarland Kitchen 7)  Vitamin C 500 Mg Tabs (Ascorbic acid) .... Take 1 tablet by mouth once a day 8)  Vitamin E-400 400 Unit Caps (Vitamin e) .... Take 1 tablet by mouth once a day 9)  Coumadin 2.5 Mg Tabs (Warfarin sodium) .... Take as directed by coumadin clinic.  Patient Instructions: 1)  Today we updated your med list- see below.... 2)  We refilled your meds for 90d supplies for [facility]..Marland Kitchen 3)  Please return to our office one morning this week for your FASTING blood work... please call the "phone tree" in a few days for your lab results.Marland KitchenMarland Kitchen 4)  Call for any questions.Marland KitchenMarland Kitchen 5)  Please schedule a follow-up appointment in 6 months. Prescriptions: COUMADIN 2.5 MG TABS (WARFARIN SODIUM) Take as directed by coumadin clinic.  #90 x prn   Entered and Authorized by:   Michele Mcalpine MD   Signed by:   Michele Mcalpine MD on 01/12/2010   Method used:   Print then Give to Patient   RxID:   [id number] BONIVA 150 MG TABS (IBANDRONATE SODIUM) take one tablet by mouth once every month  #12 x prn   Entered and Authorized by:   Michele Mcalpine MD   Signed by:   Michele Mcalpine MD on 01/12/2010   Method used:   Print then Give to Patient   RxID:   [id number] SYNTHROID 50  MCG TABS (LEVOTHYROXINE SODIUM) Take 1 tablet by mouth once a day Brand medically necessary #90 x prn   Entered and Authorized by:   Michele Mcalpine MD   Signed by:   Michele Mcalpine MD on 01/12/2010   Method used:   Print then Give to Patient   RxID:   [id number] ZOCOR 40 MG TABS (SIMVASTATIN)  Take 1 tablet by mouth once a day  #90 x prn   Entered and Authorized by:   Michele Mcalpine MD   Signed by:   Michele Mcalpine MD on 01/12/2010   Method used:   Print then Give to Patient   RxID:   [id number] METOPROLOL TARTRATE 50 MG  TABS (METOPROLOL TARTRATE) take 1 tab by mouth two times a day...  #180 x prn   Entered and Authorized by:   Michele Mcalpine MD   Signed by:   Michele Mcalpine MD on 01/12/2010   Method used:   Print then Give to Patient   RxID:   [id number]    Immunization History:  Influenza Immunization History:    Influenza:  historical (07/29/2009)

## 2010-10-13 NOTE — Assessment & Plan Note (Signed)
Summary: 5M RECK/KLW   Primary Care Provider:  Kriste Basque MD  CC:  6 month ROV & review....  History of Present Illness: 77 y/o WF here for a follow up visit... she has multiple medical problems as noted below>   ~  May10:  she is doing extremely well for 39 and has no new complaints or concerns today...  she had right hand tendon surgery 4/10 by DrOrtman- symptoms improved... she will be traveling to [place] ([place] home= [place])...   ~  Nov10:  had great trip to [place] w/o any problems... feeling well & no new complaints... stable on current med Rx (see below)... she's already had her 2010 flu vaccine...   ~  Jan 12, 2010:  stable on the Coumadin via CC... overall doing well w/o new problems- she would like 3 mo supply of meds & will need to ret to lab this week for fasting blood work (see prob list below)...   ~  July 16, 2010:  presents w/ some incr SOB- w/ activ & at rest, can't get a DB, sighs help, min cough, no sputum, no f/c/s, feels "tight" but no wheezing... **NOTE> CXR w/ cardiomeg & basilar changes, Labs w/ Hg=10.9.Marland KitchenMarland Kitchen REC> Anemia work-up, check BNP & 2DEcho; Rx> Klonopin Bid & Lasix40; short term ROV.    Current Problem List:  HEARING LOSS (ICD-389.9) - she wears digital hearing aides...  ALLERGIC RHINITIS (ICD-477.9) - uses OTC meds Prn...  Hx of POSITIVE PPD (ICD-795.5) - no known Tb exposure... asymptomatic without cough, sputum, hemoptysis, etc... 11/11 c/o mild dyspnea but more trouble getting air "in"...  ~  CXR 11/09 mild cardiomeg, atherosclerotic Ao, clear lungs, mild scoliosis.  ~  CXR 11/10 showed similar- cardiomeg, sl incr markings, NAD.Marland Kitchen.  ~  CXR 11/11 showed similar cardiomeg but R>L basilar airsp dis vs atx vs effusion...  MITRAL VALVE PROLAPSE (ICD-424.0) - MITRAL REGURGITATION (ICD-396.3) - ATRIAL FIBRILLATION, CHRONIC (ICD-427.31) - she was followed by Walker Kehr for Cardiology- his note is reviewed... AFib dx in 2002 treated w/ rate  control on METOPROLOL 50mg Bid, and COUMADIN- followed in the coumadin clinic... last 2DEcho 6/08 showed bileaflet MVP w/ mod to severe MR...   ~  5/11:  she remains asymptomatic without CP, palpit, dizziness, syncope, dyspnea, or edema... no symptoms of TIA/ stroke... she remains very active and doing well- walks >14mi per day...  ~  11/11:  presents w/ some incr SOB- w/ activ & at rest, can't get a DB, sighs help, min cough, no sputum, no f/c/s, feels "tight" but no wheezing... **NOTE> CXR w/ cardiomeg & basilar changes, Labs w/ Hg=10.9.Marland KitchenMarland Kitchen REC> Anemia work-up, check BNP & 2DEcho; Rx> Klonopin Bid & Lasix40; short term ROV.  HYPERLIPIDEMIA (ICD-272.4) - on SIMVASTATIN 40mg /d...  ~  FLP 5/09 showed TChol 181, TG 95, HDL 54, LDL 108... same med, & low fat diet.  ~  FLP 5/10 showed TChol 168, TG 86, HDL 57, LDL 94  ~  FLP 5/11 showed TChol 170, TG 94, HDL 57, LDL 94  HYPOTHYROIDISM (ICD-244.9) - on SYNTHROID 7mcg/d... clinically euthyroid & asymptomatic...  ~  labs 5/09 showed TSH= 3.71... continue same med.  ~  labs 5/10 showed TSH= 3.50  ~  labs 5/11 showed TSH= 5.36  ~  labs 11/11 showed TSH= 5.43  LOW BACK PAIN, CHRONIC (ICD-724.2) - she has seen a chiropractor in the past, and used Vicodin, Tramadol... currently doing satis w/ her exercise program and using OTC meds only as needed.  OSTEOPOROSIS (ICD-733.00) - prev on Actonel 35mg /wk, but changed to BONIVA 150mg /mo per new insurance co ([employer]); plus Calcium/ Vits/ etc...  ~  BMD 11/07 showed TScores -1.5 sp, and -1.3 hips... sl improved from 2004 on rx.  ~  BMD 12/09 showed TScores -1.8 Spine, & -1.0 left FemNeck... continue Boniva.  ANEMIA (ICD-285.9)  ~  labs 5/10 showed Hg= 12.5, MCV= 102  ~  labs 5/11 showed Hg= 12.5, MCV= 99  ~  11/11:  presented w/ dyspnea & Hg=10.9 ?etiology & w/u > pending.  SHINGLES, HX OF (ICD-V13.8) - we briefly discussed the shingles vaccine...  Health Maint:  she has never had a colonoscopy (now  in her 25's & on Coumadin); no hx of GI problems & Hg= 13.0 (5/09) & 12.5 (5/10)... she gets the seasonal Flu vaccine yearly.   Preventive Screening-Counseling & Management  Alcohol-Tobacco     Smoking Status: quit     Year Quit: 1970  Allergies: 1)  ! Codeine  Comments:  Nurse/Medical Assistant: The patient's medications and allergies were reviewed with the patient and were updated in the Medication and Allergy Lists.  Past History:  Past Medical History: HEARING LOSS (ICD-389.9) ALLERGIC RHINITIS (ICD-477.9) Hx of POSITIVE PPD (ICD-795.5) MITRAL VALVE PROLAPSE (ICD-424.0) MITRAL REGURGITATION (ICD-396.3) ATRIAL FIBRILLATION, CHRONIC (ICD-427.31) HYPERLIPIDEMIA (ICD-272.4) HYPOTHYROIDISM (ICD-244.9) LOW BACK PAIN, CHRONIC (ICD-724.2) OSTEOPOROSIS (ICD-733.00) ANEMIA (ICD-285.9) SHINGLES, HX OF (ICD-V13.8)  Past Surgical History: S/P myringotomies  Family History: Reviewed history from 07/16/2009 and no changes required. Mother deceased age 40  from a massive stroke Father deceased age 85 from ?cancer- heavy smoker 2 Siblings: both brothers- one died age 61 from pulm embolism after surg, & the other died age 65 from MI...  Social History: Reviewed history from 07/16/2009 and no changes required. married exsmoker  quit in 1970 smoked for 10 years  1 cig per day drinks 1 glass of wine with dinner daily 2 daughters with no significant medical hx  Review of Systems      See HPI       The patient complains of decreased hearing and dyspnea on exertion.  The patient denies anorexia, fever, weight loss, weight gain, vision loss, hoarseness, chest pain, syncope, peripheral edema, prolonged cough, headaches, hemoptysis, abdominal pain, melena, hematochezia, severe indigestion/heartburn, hematuria, incontinence, muscle weakness, suspicious skin lesions, transient blindness, difficulty walking, depression, unusual weight change, abnormal bleeding, enlarged lymph nodes, and  angioedema.    Vital Signs:  Patient profile:   77 year old female Height:      62 inches Weight:      124.25 pounds BMI:     22.81 O2 Sat:      95 % on Room air Temp:     96.7 degrees F oral Pulse rate:   94 / minute BP sitting:   118 / 84  (left arm) Cuff size:   regular  Vitals Entered By: Randell Loop CMA (July 16, 2010 11:30 AM)  O2 Sat at Rest %:  95 O2 Flow:  Room air CC: 6 month ROV & review... Is Patient Diabetic? No Pain Assessment Patient in pain? no      Comments meds updated today with pt   Physical Exam  Additional Exam:  WD, WN, 77 y/o WF in NAD... GENERAL:  Alert & oriented; pleasant & cooperative... HEENT:   [place]/AT, EOM-wnl, PERRLA, EACs-clear, TMs-wnl, NOSE-clear, THROAT-clear & wnl. NECK:  Supple w/ fairROM; no JVD; normal carotid impulses w/o bruits; no thyromegaly or nodules palpated; no lymphadenopathy. CHEST:  Decr BS bilat w/ few basilar rales & scat rhonchi, not coughing, no wheezing, no consolidation. HEART:  irreg AFib w/ control VR; gr 3/6 sys murmur at apex, no rubs or gallops... ABDOMEN:  Soft & nontender; normal bowel sounds; no organomegaly or masses detected. EXT: without deformities, mild arthritic changes; no varicose veins/ venous insuffic/ or edema. NEURO:  CN's intact; motor testing normal; sensory testing normal; gait normal & balance OK. DERM:  No lesions noted; no rash etc...    CXR  Procedure date:  07/16/2010  Findings:      CHEST - 2 VIEW Comparison: Chest 07/16/2009.   Findings: There is marked cardiomegaly.  The patient has some right basilar airspace disease.  Lungs otherwise clear.  The chest is hyperexpanded.   IMPRESSION:   1.  Right basilar airspace disease could be due to atelectasis or pneumonia. 2.  Marked cardiomegaly. 3.  Pulmonary hyperexpansion compatible with emphysema.   Read By:  Charyl Dancer,  M.D.   MISC. Report  Procedure date:  07/16/2010  Findings:      BMP (METABOL)    Sodium                    136 mEq/L                   135-145   Potassium                 5.1 mEq/L                   3.5-5.1   Chloride                  104 mEq/L                   96-112   Carbon Dioxide            25 mEq/L                    19-32   Glucose                   79 mg/dL                    13-08   BUN                       13 mg/dL                    6-57   Creatinine                0.8 mg/dL                   8.4-6.9   Calcium                   9.1 mg/dL                   6.2-95.2   GFR                       74.21 mL/min                >60  CBC Platelet w/Diff (CBCD)   White Cell Count          6.1 K/uL  4.5-10.5   Red Cell Count       [L]  3.27 Mil/uL                 3.87-5.11   Hemoglobin           [L]  10.9 g/dL                   04.5-40.9   Hematocrit           [L]  32.1 %                      36.0-46.0   MCV                       98.3 fl                     78.0-100.0   Platelet Count            200.0 K/uL                  150.0-400.0   Neutrophil %              67.8 %                      43.0-77.0   Lymphocyte %              19.0 %                      12.0-46.0   Monocyte %                11.4 %                      3.0-12.0   Eosinophils%              1.7 %                       0.0-5.0   Basophils %               0.1 %                       0.0-3.0   TSH (TSH)   FastTSH                   5.43 uIU/mL                 0.35-5.50   Impression & Recommendations:  Problem # 1:  DYSPNEA (ICD-786.05) Her hx suggests anxiety/ "chest wall muscle spasm" dyspnea (trouble getting air "in", improved w/ DB/ sigh, etc) but CXR w/ basilar changes (atx, effusion, doubt infiltrate) & she has chr cardiomeg, MR, AFib... REC> Rx Klonopin 0.5mg  Bid, add Lasix 40mg  Qam, check 2DEcho follow up & further eval anemia... ROV 3 weeks to re-assess.  Problem # 2:  ANEMIA (ICD-285.9) Hg= 10.9 & prev 12.5 range... no obvious blood loss etc... needs ret to lab for anemia  eval & Rx accordingly...  Problem # 3:  Hx of POSITIVE PPD (ICD-795.5) No evid active lung infection... Orders: T-2 View CXR (71020TC)  Problem # 4:  ATRIAL FIBRILLATION, CHRONIC (ICD-427.31) She has Chr AFib on coumadin & rate control w/ Metoprolol... mod MR & norm LVF (60%) in 2008... needs repeat 2DEcho. Her updated medication list for this problem includes:  Metoprolol Tartrate 50 Mg Tabs (Metoprolol tartrate) .Marland Kitchen... Take 1 tab by mouth two times a day...    Coumadin 2.5 Mg Tabs (Warfarin sodium) .Marland Kitchen... Take as directed by coumadin clinic.  Orders: TLB-BMP (Basic Metabolic Panel-BMET) (80048-METABOL) TLB-CBC Platelet - w/Differential (85025-CBCD) TLB-TSH (Thyroid Stimulating Hormone) (84443-TSH)  Problem # 5:  HYPERLIPIDEMIA (ICD-272.4) Stable on Simva40>  continue same. Her updated medication list for this problem includes:    Zocor 40 Mg Tabs (Simvastatin) .Marland Kitchen... Take 1 tablet by mouth once a day  Problem # 6:  HYPOTHYROIDISM (ICD-244.9) Stable on Synthroid 50>  continue same. Her updated medication list for this problem includes:    Synthroid 50 Mcg Tabs (Levothyroxine sodium) .Marland Kitchen... Take 1 tablet by mouth once a day  Problem # 7:  OSTEOPOROSIS (ICD-733.00) Stable on Boniva, Calcium, MVI, Vit D... Her updated medication list for this problem includes:    Boniva 150 Mg Tabs (Ibandronate sodium) .Marland Kitchen... Take one tablet by mouth once every month  Problem # 8:  OTHER MEDICAL PROBLEMS AS NOTED>>>  Complete Medication List: 1)  Metoprolol Tartrate 50 Mg Tabs (Metoprolol tartrate) .... Take 1 tab by mouth two times a day... 2)  Zocor 40 Mg Tabs (Simvastatin) .... Take 1 tablet by mouth once a day 3)  Synthroid 50 Mcg Tabs (Levothyroxine sodium) .... Take 1 tablet by mouth once a day 4)  Boniva 150 Mg Tabs (Ibandronate sodium) .... Take one tablet by mouth once every month 5)  Caltrate 600+d 600-400 Mg-unit Tabs (Calcium carbonate-vitamin d) .... Take 1 tab by mouth two times a  day... 6)  Multivitamins Tabs (Multiple vitamin) .... Take 1 tab by mouth once daily.Marland KitchenMarland Kitchen 7)  Vitamin C 500 Mg Tabs (Ascorbic acid) .... Take 1 tablet by mouth once a day 8)  Vitamin D 1000 Unit Tabs (Cholecalciferol) .... Take 1 tablet by mouth once a day 9)  Vitamin E-400 400 Unit Caps (Vitamin e) .... Take 1 tablet by mouth once a day 10)  Coumadin 2.5 Mg Tabs (Warfarin sodium) .... Take as directed by coumadin clinic. 11)  Clonazepam 0.5 Mg Tabs (Clonazepam) .... Take 1 tab by mouth two times a day... 12)  Furosemide 40 Mg Tabs (Furosemide) .... Take 1 tab by mouth once daily in am...  Patient Instructions: 1)  Today we updated your med list- see below.... 2)  Continue your current meds the same... 3)  We decided to add an additional medication to help your shortness of breath by relaxing your chest wall muscles & aiding your getting a deeper better inspiration> start the CLONAZEPAM 1 tab twice daily.Marland KitchenMarland Kitchen 4)  Today we did your follow up CXR & non-fasting blood work... please call the "phone tree" in a few days for your lab results.Marland KitchenMarland Kitchen 5)  Stay as active as possible... 6)  Call for any questions.Marland KitchenMarland Kitchen 7)  Please schedule a follow-up appointment in 6 months. Prescriptions: FUROSEMIDE 40 MG TABS (FUROSEMIDE) take 1 tab by mouth once daily in AM...  #30 x 6   Entered and Authorized by:   Michele Mcalpine MD   Signed by:   Michele Mcalpine MD on 07/17/2010   Method used:   Print then Give to Patient   RxID:   [id number] CLONAZEPAM 0.5 MG TABS (CLONAZEPAM) take 1 tab by mouth two times a day...  #60 x 12   Entered and Authorized by:   Michele Mcalpine MD   Signed by:   Michele Mcalpine MD on 07/16/2010   Method used:  Print then Give to Patient   RxID:   [id number]    Immunization History:  Influenza Immunization History:    Influenza:  historical (06/29/2010)

## 2010-10-13 NOTE — Medication Information (Signed)
Summary: [employer]   Imported By: Kassie Mends 10/31/2009 07:59:03  _____________________________________________________________________  External Attachment:    Type:   Image     Comment:   External Document

## 2010-10-13 NOTE — Medication Information (Signed)
Summary: rov/ez  Anticoagulant Therapy  Managed by: Shelby Dubin, PharmD, BCPS, CPP Referring MD: Charlies Constable MD Supervising MD: Tenny Craw MD, Gunnar Fusi Indication 1: Atrial Fibrillation (ICD-427.31) Lab Used: [facility] Site: [facility] INR POC 2.6 INR RANGE 2 - 3  Dietary changes: no    Health status changes: no    Bleeding/hemorrhagic complications: no    Recent/future hospitalizations: no    Any changes in medication regimen? no    Recent/future dental: no  Any missed doses?: no       Is patient compliant with meds? yes       Allergies (verified): 1)  ! Codeine  Anticoagulation Management History:      The patient is taking warfarin and comes in today for a routine follow up visit.  Positive risk factors for bleeding include an age of 75 years or older.  The bleeding index is 'intermediate risk'.  Positive CHADS2 values include History of HTN and Age > 75 years old.  The start date was 03/24/2001.  Anticoagulation responsible provider: Tenny Craw MD, Gunnar Fusi.  INR POC: 2.6.  Cuvette Lot#: [id number].  Exp: 12/2010.    Anticoagulation Management Assessment/Plan:      The patient's current anticoagulation dose is Coumadin 2.5 mg tabs: Take as directed by coumadin clinic..  The target INR is 2.0-3.0.  The next INR is due 11/21/2009.  Anticoagulation instructions were given to patient.  Results were reviewed/authorized by Shelby Dubin, PharmD, BCPS, CPP.  She was notified by Shelby Dubin PharmD, BCPS, CPP.         Prior Anticoagulation Instructions: INR: 3.2 Skip today's dose then resume to same dosage of 1 tablet daily except 1/2 tablet on Monday, Wednesdays and Fridays Recheck in 3 weeks   Current Anticoagulation Instructions: INR 2.6  Continue 1 tab daily except take 0.5 tab on Mondays, Wednesdays, Fridays.   Recheck in 4 weeks.

## 2010-10-15 NOTE — Letter (Signed)
Summary: Physician Statement Form/[employer] [place]  Physician Statement Form/[employer]   Imported By: Sherian Rein 09/17/2010 10:42:35  _____________________________________________________________________  External Attachment:    Type:   Image     Comment:   External Document

## 2010-10-15 NOTE — Medication Information (Signed)
Summary: Coumadin Clinic   Anticoagulant Therapy  Managed by: Weston Brass, PharmD Referring MD: Charlies Constable MD PCP: Kriste Basque MD Supervising MD: Gala Romney MD, Reuel Boom Indication 1: Atrial Fibrillation (ICD-427.31) Lab Used: [facility] Site: [facility] PT 51.5 INR RANGE 2 - 3  Dietary changes: no    Health status changes: no    Bleeding/hemorrhagic complications: yes       Details: Pt called to report nose bleed.  Went to ENT to have it stopped.  PT/INR checked at Dr. Jodelle Green office   Recent/future hospitalizations: no    Any changes in medication regimen? no    Recent/future dental: no  Any missed doses?: no       Is patient compliant with meds? yes       Allergies: 1)  ! Codeine  Anticoagulation Management History:      Her anticoagulation is being managed by telephone today.  Positive risk factors for bleeding include an age of 75 years or older.  The bleeding index is 'intermediate risk'.  Positive CHADS2 values include History of CHF, History of HTN, and Age > 26 years old.  The start date was 03/24/2001.  Her last INR was 4.8 ratio and today's INR is 4.8.  Prothrombin time is 51.5.  Anticoagulation responsible provider: Renner Sebald MD, Reuel Boom.  Exp: 08/2011.    Anticoagulation Management Assessment/Plan:      The patient's current anticoagulation dose is Coumadin 2.5 mg tabs: Take as directed by coumadin clinic..  The target INR is 2.0-3.0.  The next INR is due 09/04/2010.  Anticoagulation instructions were given to patient.  Results were reviewed/authorized by Weston Brass, PharmD.  She was notified by Weston Brass PharmD.         Prior Anticoagulation Instructions: INR 4.0   Skip today's dose, Tomorrow only take 1/2 pill then resume 1 pill everyday except 1/2 pill on Mondays and Fridays. Recheck in 2 weeks.   Current Anticoagulation Instructions: INR 4.8  Spoke with pt's husband.  Skip Coumadin today and tomorrow then decrease dose ot 1 tablet every day except 1/2 tablet  on Monday, Wednesday, Friday and Saturday.  Recheck INR in 10-14 days.

## 2010-10-15 NOTE — Progress Notes (Signed)
Summary: papers- form for reimbursement  Phone Note Call from Patient Call back at Home Phone [phone]   Caller: Spouse DICK Hopfer Call For: nadel Summary of Call: spouse dropped off papers re: recent trip that was cancelled- pt seeking reimbursement). co. cannot read the dr's notes. requesting form filled out "front and back" and faxed back to co. [employer] [place]. i have given this to leigh. call mr meyers at home with any questions and to pick up when ready. requests to have back asap.  Initial call taken by: Tivis Ringer, CNA,  August 24, 2010 11:41 AM  Follow-up for Phone Call        forms have been refilled out and pt is aware that these are ready to be picked up---the forms have already been faxed per pts request. Randell Loop [facility]  August 25, 2010 11:03 AM

## 2010-10-15 NOTE — Progress Notes (Signed)
Summary: nose bleed  Phone Note Call from Patient Call back at Home Phone [phone]   Caller: spouse-Dick Call For: SN Summary of Call: Spoke with pt husband and he states the pt has had a nose bleed since 7pm last night. They have tried ice packs, packing the nose with gauze adn tissue with no relief. He states the blood is bright red and the pt is also having dark red clots coming from nose as well. The pt is on coumadin so they caleld the coumadin clinic and was advised to call us. Please advsie.Carron Curie CMA  August 25, 2010 9:54 AM [facility] Initial call taken by: Carron Curie CMA,  August 25, 2010 9:54 AM  Follow-up for Phone Call        per SN---will need to come in for lab work and appt today with ENT.  order has been put in the computer and pts husband is aware of this and that we will be calling with appt time.   Randell Loop [facility]  August 25, 2010 10:27 AM   New Problems: EPISTAXIS (ICD-784.7)   New Problems: EPISTAXIS (ICD-784.7)

## 2010-10-15 NOTE — Consult Note (Signed)
Summary: [facility]  [facility]   Imported By: Lester [place] 09/03/2010 11:51:31  _____________________________________________________________________  External Attachment:    Type:   Image     Comment:   External Document

## 2010-10-15 NOTE — Medication Information (Signed)
Summary: rov/sp   Anticoagulant Therapy  Managed by: Weston Brass, PharmD Referring MD: Charlies Constable MD PCP: Kriste Basque MD Supervising MD: Jens Som MD, Arlys John Indication 1: Atrial Fibrillation (ICD-427.31) Lab Used: [facility] Site: [facility] INR POC 3.3 INR RANGE 2 - 3  Dietary changes: no    Health status changes: no    Bleeding/hemorrhagic complications: no    Recent/future hospitalizations: no    Any changes in medication regimen? no    Recent/future dental: no  Any missed doses?: no       Is patient compliant with meds? yes       Allergies: 1)  ! Codeine  Anticoagulation Management History:      The patient is taking warfarin and comes in today for a routine follow up visit.  Positive risk factors for bleeding include an age of 75 years or older.  The bleeding index is 'intermediate risk'.  Positive CHADS2 values include History of CHF, History of HTN, and Age > 52 years old.  The start date was 03/24/2001.  Her last INR was 4.8.  Anticoagulation responsible provider: Jens Som MD, Arlys John.  INR POC: 3.3.  Exp: 10/2011.    Anticoagulation Management Assessment/Plan:      The patient's current anticoagulation dose is Coumadin 2.5 mg tabs: Take as directed by coumadin clinic..  The target INR is 2.0-3.0.  The next INR is due 10/02/2010.  Anticoagulation instructions were given to patient.  Results were reviewed/authorized by Weston Brass, PharmD.  She was notified by Linward Headland, PharmD candidate.         Prior Anticoagulation Instructions: INR 2.8  Continue same dose of 1/2 tablet every day except 1 tablet on Sunday, Tuesday, and Thursday.  Recheck INR in 2 1/2 weeks.   Current Anticoagulation Instructions: INR 3.3( INR goal:  2-3)  Skip today's dose.  Take 1 tablet on Sundays and Thursdays and 1/2 tablet on Mondays, Tuesdays, Wednesdays, Fridays, and Saturdays.

## 2010-10-15 NOTE — Letter (Signed)
Summary: [facility]  [facility]   Imported By: Sherian Rein 09/25/2010 12:57:47  _____________________________________________________________________  External Attachment:    Type:   Image     Comment:   External Document

## 2010-10-15 NOTE — Assessment & Plan Note (Signed)
Summary: 1 month follow up / cj   Primary Care Ellajane Stong:  Kriste Basque MD  CC:  1 month ROV & review....  History of Present Illness: 77 y/o WF here for a follow up visit... she has multiple medical problems including CHF w/ severe MR and chr AFib, Hyperlipidemia, Hypothyroid, Osteopenia, etc... she is followed for Cards by Walker Kehr.   ~  Jan 12, 2010:  stable on the Coumadin via CC... overall doing well w/o new problems- she would like 3 mo supply of meds & will need to ret to lab this week for fasting blood work (see prob list below)...   ~  July 16, 2010:  presents w/ some incr SOB- w/ activ & at rest, can't get a DB, sighs help, min cough, no sputum, no f/c/s, feels "tight" but no wheezing... **NOTE> CXR w/ cardiomeg & basilar changes, Labs w/ Hg=10.9, BNP=1089 >> admitted for diuresis & 2DEcho showed severe MR, mild LVH, EF 55-60%, & pulm HTN w/ PAsys=55...   ~  July 29, 2010:  post hosp check- improved after diuresis > eval revealed severe MR & pulmHTN as noted... sent home on Dig (level= 1.6 on 0.125mg /d) & Lasix 40mg /d w/ f/u BNP today = 822 & Creat= 0.9, so we will incr her Lasix to 60mg /d... they also incr her Synthroid from 50 to 47mcg/d due to TSH in the 5-7 range... heart rate today= 74/min w/ her current meds... plan f/u 33mo.   ~  August 31, 2010:  we incr the Lasix to 60mg  Qam but weight is up 2# to 118#, no swelling, breathing is good, no CP etc... she had episode epistaxis w/ eval ENT- DrBates, & he cauterized it... may need further incr in Lasix but she is feeling well now & BP= 112/64, therefore continue same meds for now & plan f/u w/ Cards 1/12 & w/ me 3/12...    Current Problem List:  HEARING LOSS (ICD-389.9) - she wears digital hearing aides...  ALLERGIC RHINITIS (ICD-477.9) - uses OTC meds Prn...  Hx of POSITIVE PPD (ICD-795.5) - no known Tb exposure... asymptomatic without cough, sputum, hemoptysis, etc... 11/11 c/o mild dyspnea but more trouble getting air  "in"...  ~  CXR 11/09 mild cardiomeg, atherosclerotic Ao, clear lungs, mild scoliosis.  ~  CXR 11/10 showed similar- cardiomeg, sl incr markings, NAD.  ~  CXR 11/11 showed similar cardiomeg but R>L basilar airsp dis vs atx vs effusion (improved w/ diuresis).  MITRAL VALVE PROLAPSE (ICD-424.0) - MITRAL REGURGITATION (ICD-396.3) - ATRIAL FIBRILLATION, CHRONIC (ICD-427.31) - she was followed by Walker Kehr for Cardiology- his notes are reviewed... AFib dx in 2002 treated w/ rate control on METOPROLOL 50mg Bid, and COUMADIN- followed in the coumadin clinic... l2DEcho 6/08 showed bileaflet MVP w/ mod to severe MR...   ~  5/11:  she remains asymptomatic without CP, palpit, dizziness, syncope, dyspnea, or edema... no symptoms of TIA/ stroke... she remains very active and doing well- walks >39mi per day...  ~  11/11:  presents w/ some incr SOB- w/ activ & at rest, can't get a DB, sighs help, min cough, no sputum, no f/c/s, feels "tight" but no wheezing... **NOTE> CXR w/ cardiomeg & basilar edema, Labs w/ Hg=10.9, BNP>1000... Adm for diuresis- 2DEcho showed mild LVH, EF=55-60%, no regional wall motion abn, severe MR, pulmHTN w/ PAsys=55... disch on DIGOXIN 0.125mg /d & LASIX 40mg /d...  ~  Post hosp check:  improved overall, Dig= 1.6, BNP= 822, Creat= 0.9> rec incr Lasix to 60mg /d.  HYPERLIPIDEMIA (ICD-272.4) -  on SIMVASTATIN 40mg /d...  ~  FLP 5/09 showed TChol 181, TG 95, HDL 54, LDL 108... same med, & low fat diet.  ~  FLP 5/10 showed TChol 168, TG 86, HDL 57, LDL 94  ~  FLP 5/11 showed TChol 170, TG 94, HDL 57, LDL 94  HYPOTHYROIDISM (ICD-244.9) - now on SYNTHROID 50mcg/d... clinically euthyroid & asymptomatic...  ~  labs 5/09 showed TSH= 3.71... continue same med.  ~  labs 5/10 showed TSH= 3.50  ~  labs 5/11 showed TSH= 5.36  ~  labs 11/11 showed TSH= 5.43  ~  labs in hosp showed TSH= 7.77 but FreeT3 & FreeT4 were WNL, they incr Synthroid to 18mcg/d anyway & we will watch HR & recheck.  LOW BACK PAIN,  CHRONIC (ICD-724.2) - she has seen a chiropractor in the past, and used Vicodin, Tramadol... currently doing satis w/ her exercise program and using OTC meds only as needed.  OSTEOPOROSIS (ICD-733.00) - prev on Actonel 35mg /wk, but changed to BONIVA 150mg /mo per new insurance co ([employer]); plus Calcium/ Vits/ etc...  ~  BMD 11/07 showed TScores -1.5 sp, and -1.3 hips... sl improved from 2004 on rx.  ~  BMD 12/09 showed TScores -1.8 Spine, & -1.0 left FemNeck... continue Boniva.  ANEMIA (ICD-285.9)  ~  labs 5/10 showed Hg= 12.5, MCV= 102  ~  labs 5/11 showed Hg= 12.5, MCV= 99  ~  11/11:  presented w/ dyspnea & Hg=10.9 ?etiology, noanemia w/u done in hosp as f/u labs showed Hg=12-13.  SHINGLES, HX OF (ICD-V13.8) - we briefly discussed the shingles vaccine...  Health Maint:  she has never had a colonoscopy (now in her 65's & on Coumadin); no hx of GI problems & Hg= 13.0 (5/09) & 12.5 (5/10)... she gets the seasonal Flu vaccine yearly.   Preventive Screening-Counseling & Management  Alcohol-Tobacco     Smoking Status: quit     Year Quit: 1970  Allergies: 1)  ! Codeine  Comments:  Nurse/Medical Assistant: The patient's medications and allergies were reviewed with the patient and were updated in the Medication and Allergy Lists.  Past History:  Past Medical History: HEARING LOSS (ICD-389.9) ALLERGIC RHINITIS (ICD-477.9) Hx of POSITIVE PPD (ICD-795.5) MITRAL VALVE PROLAPSE (ICD-424.0) MITRAL REGURGITATION (ICD-396.3) ATRIAL FIBRILLATION, CHRONIC (ICD-427.31) HYPERLIPIDEMIA (ICD-272.4) HYPOTHYROIDISM (ICD-244.9) LOW BACK PAIN, CHRONIC (ICD-724.2) OSTEOPOROSIS (ICD-733.00) ANEMIA (ICD-285.9) SHINGLES, HX OF (ICD-V13.8)  Past Surgical History: S/P myringotomies  Family History: Reviewed history from 07/16/2010 and no changes required. Mother deceased age 15  from a massive stroke Father deceased age 49 from ?cancer- heavy smoker 2 Siblings: both brothers- one died  age 22 from pulm embolism after surg, & the other died age 75 from MI...  Social History: Reviewed history from 07/16/2010 and no changes required. married exsmoker  quit in 1970 smoked for 10 years  1 cig per day drinks 1 glass of wine with dinner daily 2 daughters with no significant medical hx  Review of Systems      See HPI       The patient complains of dyspnea on exertion.  The patient denies anorexia, fever, weight loss, weight gain, vision loss, decreased hearing, hoarseness, chest pain, syncope, peripheral edema, prolonged cough, headaches, hemoptysis, abdominal pain, melena, hematochezia, severe indigestion/heartburn, hematuria, incontinence, muscle weakness, suspicious skin lesions, transient blindness, difficulty walking, depression, unusual weight change, abnormal bleeding, enlarged lymph nodes, and angioedema.    Vital Signs:  Patient profile:   77 year old female Height:      61  inches Weight:      118.13 pounds BMI:     21.68 O2 Sat:      97 % on Room air Temp:     96.3 degrees F oral Pulse rate:   65 / minute BP sitting:   112 / 64  (left arm) Cuff size:   regular  Vitals Entered By: Randell Loop CMA (August 31, 2010 3:03 PM)  O2 Sat at Rest %:  97 O2 Flow:  Room air CC: 1 month ROV & review... Is Patient Diabetic? No Pain Assessment Patient in pain? no      Comments meds updated today with pt---pt brought all meds today   Physical Exam  Additional Exam:  WD, WN, 77 y/o WF in NAD... GENERAL:  Alert & oriented; pleasant & cooperative... HEENT:  [place]/AT, EOM-wnl, PERRLA, EACs-clear, TMs-wnl, NOSE-clear, THROAT-clear & wnl. NECK:  Supple w/ fairROM; no JVD; normal carotid impulses w/o bruits; no thyromegaly or nodules palpated; no lymphadenopathy. CHEST:  Decr BS bilat w/ few basilar rales & scat rhonchi, not coughing, no wheezing, no consolidation. HEART:  irreg AFib w/ control VR; gr 3/6 sys murmur at apex, no rubs or gallops... ABDOMEN:  Soft &  nontender; normal bowel sounds; no organomegaly or masses detected. EXT: without deformities, mild arthritic changes; no varicose veins/ venous insuffic/ or edema. NEURO:  CN's intact; motor testing normal; sensory testing normal; gait normal & balance OK. DERM:  No lesions noted; no rash etc...    Impression & Recommendations:  Problem # 1:  ACUTE ON CHRONIC DIASTOLIC HEART FAILURE (ICD-428.33) CHF improved w/ diuresis... she knows to elim salt, etc... keep Lasix at 60mg /d now... Her updated medication list for this problem includes:    Coumadin 2.5 Mg Tabs (Warfarin sodium) .Marland Kitchen... Take as directed by coumadin clinic.    Aspirin 81 Mg Tabs (Aspirin) .Marland Kitchen... Take 1 tablet by mouth once a day    Digoxin 0.125 Mg Tabs (Digoxin) .Marland Kitchen... Take 1 tablet by mouth once a day    Metoprolol Tartrate 50 Mg Tabs (Metoprolol tartrate) .Marland Kitchen... Take 1 tab by mouth two times a day...    Furosemide 40 Mg Tabs (Furosemide) .Marland Kitchen... Take 1 & 1/2 tabs by mouth once daily in am...  Orders: Cardiology Referral (Cardiology)  Problem # 2:  MITRAL REGURGITATION (ICD-396.3) Aware>  followed by Cards w/ 2DEcho 11/11... Her updated medication list for this problem includes:    Coumadin 2.5 Mg Tabs (Warfarin sodium) .Marland Kitchen... Take as directed by coumadin clinic.    Aspirin 81 Mg Tabs (Aspirin) .Marland Kitchen... Take 1 tablet by mouth once a day    Metoprolol Tartrate 50 Mg Tabs (Metoprolol tartrate) .Marland Kitchen... Take 1 tab by mouth two times a day...  Problem # 3:  ATRIAL FIBRILLATION, CHRONIC (ICD-427.31) On Coumadin via CC... good rate control w/ Metop & Dig... continue same for now. Her updated medication list for this problem includes:    Coumadin 2.5 Mg Tabs (Warfarin sodium) .Marland Kitchen... Take as directed by coumadin clinic.    Aspirin 81 Mg Tabs (Aspirin) .Marland Kitchen... Take 1 tablet by mouth once a day    Digoxin 0.125 Mg Tabs (Digoxin) .Marland Kitchen... Take 1 tablet by mouth once a day    Metoprolol Tartrate 50 Mg Tabs (Metoprolol tartrate) .Marland Kitchen... Take 1 tab by  mouth two times a day...  Problem # 4:  HYPERLIPIDEMIA (ICD-272.4) We will f/u Fasting labs 3/12... continue Simva40. Her updated medication list for this problem includes:    Zocor 40 Mg Tabs (Simvastatin) .Marland KitchenMarland KitchenMarland KitchenMarland Kitchen  Take 1 tablet by mouth once a day  Problem # 5:  HYPOTHYROIDISM (ICD-244.9) We will f/u labs 3/12... continue Levo75/d. Her updated medication list for this problem includes:    Synthroid 75 Mcg Tabs (Levothyroxine sodium) .Marland Kitchen... Take 1 tablet by mouth once a day  Problem # 6:  OTHER MEDICAL PROBLEMS AS NOTED>>>  Complete Medication List: 1)  Coumadin 2.5 Mg Tabs (Warfarin sodium) .... Take as directed by coumadin clinic. 2)  Aspirin 81 Mg Tabs (Aspirin) .... Take 1 tablet by mouth once a day 3)  Digoxin 0.125 Mg Tabs (Digoxin) .... Take 1 tablet by mouth once a day 4)  Metoprolol Tartrate 50 Mg Tabs (Metoprolol tartrate) .... Take 1 tab by mouth two times a day... 5)  Furosemide 40 Mg Tabs (Furosemide) .... Take 1 & 1/2 tabs by mouth once daily in am... 6)  Klor-con M20 20 Meq Cr-tabs (Potassium chloride crys cr) .... Take one tablet by mouth once daily and 1/2 tablet in evening while taking the lasix 7)  Zocor 40 Mg Tabs (Simvastatin) .... Take 1 tablet by mouth once a day 8)  Synthroid 75 Mcg Tabs (Levothyroxine sodium) .... Take 1 tablet by mouth once a day 9)  Boniva 150 Mg Tabs (Ibandronate sodium) .... Take one tablet by mouth once every month 10)  Caltrate 600+d 600-400 Mg-unit Tabs (Calcium carbonate-vitamin d) .... Take 1 tab by mouth two times a day... 11)  Multivitamins Tabs (Multiple vitamin) .... Take 1 tab by mouth once daily... 12)  Vitamin C 500 Mg Tabs (Ascorbic acid) .... Take 1 tablet by mouth once a day 13)  Vitamin D 1000 Unit Tabs (Cholecalciferol) .... Take 1 tablet by mouth once a day 14)  Vitamin E-400 400 Unit Caps (Vitamin e) .... Take 1 tablet by mouth once a day 15)  Pro-flora Concentrate Caps (Probiotic product) .... Take 1 tablet by mouth once a  day  Patient Instructions: 1)  Today we updated your med list- see below.... 2)  Continue your current meds the same... 3)  We will arrange for a Cardiology f/u w/ drNishan in January.Marland KitchenMarland Kitchen 4)  Call for any problems.Marland KitchenMarland Kitchen 5)  Please schedule a follow-up appointment in 3 months, w/ FASTING blood work at that time.Marland KitchenMarland Kitchen

## 2010-10-15 NOTE — Medication Information (Signed)
Summary: rov/tm   Anticoagulant Therapy  Managed by: Weston Brass, PharmD Referring MD: Charlies Constable MD PCP: Kriste Basque MD Supervising MD: Riley Kill MD, Maisie Fus Indication 1: Atrial Fibrillation (ICD-427.31) Lab Used: [facility] Site: [facility] INR POC 2.8 INR RANGE 2 - 3  Dietary changes: no    Health status changes: no    Bleeding/hemorrhagic complications: yes       Details: no additional nosebleeds since last INR check  Recent/future hospitalizations: no    Any changes in medication regimen? no    Recent/future dental: no  Any missed doses?: no       Is patient compliant with meds? yes       Allergies: 1)  ! Codeine  Anticoagulation Management History:      The patient is taking warfarin and comes in today for a routine follow up visit.  Positive risk factors for bleeding include an age of 75 years or older.  The bleeding index is 'intermediate risk'.  Positive CHADS2 values include History of CHF, History of HTN, and Age > 26 years old.  The start date was 03/24/2001.  Her last INR was 4.8.  Anticoagulation responsible provider: Riley Kill MD, Maisie Fus.  INR POC: 2.8.  Cuvette Lot#: [id number].  Exp: 10/2011.    Anticoagulation Management Assessment/Plan:      The patient's current anticoagulation dose is Coumadin 2.5 mg tabs: Take as directed by coumadin clinic..  The target INR is 2.0-3.0.  The next INR is due 09/18/2010.  Anticoagulation instructions were given to patient.  Results were reviewed/authorized by Weston Brass, PharmD.  She was notified by Weston Brass PharmD.         Prior Anticoagulation Instructions: INR 4.8  Spoke with pt's husband.  Skip Coumadin today and tomorrow then decrease dose ot 1 tablet every day except 1/2 tablet on Monday, Wednesday, Friday and Saturday.  Recheck INR in 10-14 days.   Current Anticoagulation Instructions: INR 2.8  Continue same dose of 1/2 tablet every day except 1 tablet on Sunday, Tuesday, and Thursday.  Recheck INR in 2 1/2 weeks.

## 2010-10-15 NOTE — Medication Information (Signed)
Summary: rov/tp  Anticoagulant Therapy  Managed by: Geoffry Paradise, PharmD Referring MD: Charlies Constable MD PCP: Kriste Basque MD Supervising MD: Ladona Ridgel MD, Sharlot Gowda Indication 1: Atrial Fibrillation (ICD-427.31) Lab Used: [facility] Site: [facility] INR POC 2.1 INR RANGE 2 - 3  Dietary changes: no    Health status changes: no    Bleeding/hemorrhagic complications: no    Recent/future hospitalizations: no    Any changes in medication regimen? no    Recent/future dental: no  Any missed doses?: no       Is patient compliant with meds? yes       Allergies: 1)  ! Codeine  Anticoagulation Management History:      The patient is taking warfarin and comes in today for a routine follow up visit.  Positive risk factors for bleeding include an age of 46 years or older.  The bleeding index is 'intermediate risk'.  Positive CHADS2 values include History of CHF, History of HTN, and Age > 88 years old.  The start date was 03/24/2001.  Her last INR was 4.8.  Anticoagulation responsible provider: Ladona Ridgel MD, Sharlot Gowda.  INR POC: 2.1.  Cuvette Lot#: [id number].  Exp: 09/2011.    Anticoagulation Management Assessment/Plan:      The patient's current anticoagulation dose is Coumadin 2.5 mg tabs: Take as directed by coumadin clinic..  The target INR is 2.0-3.0.  The next INR is due 10/23/2010.  Anticoagulation instructions were given to patient.  Results were reviewed/authorized by Geoffry Paradise, PharmD.         Prior Anticoagulation Instructions: INR 3.3( INR goal:  2-3)  Skip today's dose.  Take 1 tablet on Sundays and Thursdays and 1/2 tablet on Mondays, Tuesdays, Wednesdays, Fridays, and Saturdays.  Current Anticoagulation Instructions: INR:  2.1 (goal 2-3)  Your INR is at goal today.  Continue taking 1/2 a tablet everyday except for 1 tablet on Sunday and Thursday.  Return in 4 weeks for another INR check.

## 2010-10-21 NOTE — Assessment & Plan Note (Signed)
Summary: 46yr f/u/appt. with coumadin at 2:30/sl      Allergies Added:   Primary Provider:  Kriste Basque MD   History of Present Illness: Erin Rivers is a long time patient of Dr Kriste Basque.  She has seen Dr Juanda Chance and Tenny Craw in cardiology.  She was hospitalized in 11/11 for pneumonia and ? diastolic failure.  She has chronic afib since 2002 that Dr Juanda Chance elected to Rx with rate control and anticoagulation. Last echo 11/11 showed severe biatrial enlargement with severe MR bileaflet prolapse and normal LV function and EF.  Discussed advanced directives which she really hadn't thought about.  Encouraged her to discuss this further with Dr Okey Regal.Rip Harbour for her to travel to [place] to visit granddaughter.  Current Problems (verified): 1)  Mitral Valve Prolapse  (ICD-424.0) 2)  Mitral Regurgitation  (ICD-396.3) 3)  Atrial Fibrillation, Chronic  (ICD-427.31) 4)  Hyperlipidemia  (ICD-272.4) 5)  CHF  (ICD-428.0) 6)  Hypercholesterolemia  (ICD-272.0) 7)  Pneumonia  (ICD-486) 8)  Edema  (ICD-782.3) 9)  Epistaxis  (ICD-784.7) 10)  Acute On Chronic Diastolic Heart Failure  (ICD-428.33) 11)  Dyspnea  (ICD-786.05) 12)  Unspecified Essential Hypertension  (ICD-401.9) 13)  Hearing Loss  (ICD-389.9) 14)  Allergic Rhinitis  (ICD-477.9) 15)  Hx of Positive Ppd  (ICD-795.5) 16)  Hypothyroidism  (ICD-244.9) 17)  Low Back Pain, Chronic  (ICD-724.2) 18)  Osteoporosis  (ICD-733.00) 19)  Anemia  (ICD-285.9) 20)  Shingles, Hx of  (ICD-V13.8)  Current Medications (verified): 1)  Coumadin 2.5 Mg Tabs (Warfarin Sodium) .... Take As Directed By Coumadin Clinic. 2)  Aspirin 81 Mg Tabs (Aspirin) .... Take 1 Tablet By Mouth Once A Day 3)  Digoxin 0.125 Mg Tabs (Digoxin) .... Take 1 Tablet By Mouth Once A Day 4)  Metoprolol Tartrate 50 Mg  Tabs (Metoprolol Tartrate) .... Take 1 Tab By Mouth Two Times A Day... 5)  Furosemide 40 Mg Tabs (Furosemide) .... Take 1 & 1/2 Tabs By Mouth Once Daily in Am... 6)  Klor-Con M20 20 Meq Cr-Tabs  (Potassium Chloride Crys Cr) .... Take One Tablet By Mouth Once Daily and 1/2 Tablet in Evening While Taking The Lasix 7)  Zocor 40 Mg Tabs (Simvastatin) .... Take 1 Tablet By Mouth Once A Day 8)  Synthroid 75 Mcg Tabs (Levothyroxine Sodium) .... Take 1 Tablet By Mouth Once A Day 9)  Boniva 150 Mg Tabs (Ibandronate Sodium) .... Take One Tablet By Mouth Once Every Month 10)  Caltrate 600+d 600-400 Mg-Unit  Tabs (Calcium Carbonate-Vitamin D) .... Take 1 Tab By Mouth Two Times A Day... 11)  Multivitamins   Tabs (Multiple Vitamin) .... Take 1 Tab By Mouth Once Daily... 12)  Vitamin C 500 Mg Tabs (Ascorbic Acid) .... Take 1 Tablet By Mouth Once A Day 13)  Vitamin D 1000 Unit Tabs (Cholecalciferol) .... Take 1 Tablet By Mouth Once A Day 14)  Vitamin E-400 400 Unit Caps (Vitamin E) .... Take 1 Tablet By Mouth Once A Day 15)  Pro-Flora Concentrate  Caps (Probiotic Product) .... Take 1 Tablet By Mouth Once A Day  Allergies (verified): 1)  ! Codeine  Past History:  Past Medical History: Last updated: 08/31/2010 HEARING LOSS (ICD-389.9) ALLERGIC RHINITIS (ICD-477.9) Hx of POSITIVE PPD (ICD-795.5) MITRAL VALVE PROLAPSE (ICD-424.0) MITRAL REGURGITATION (ICD-396.3) ATRIAL FIBRILLATION, CHRONIC (ICD-427.31) HYPERLIPIDEMIA (ICD-272.4) HYPOTHYROIDISM (ICD-244.9) LOW BACK PAIN, CHRONIC (ICD-724.2) OSTEOPOROSIS (ICD-733.00) ANEMIA (ICD-285.9) SHINGLES, HX OF (ICD-V13.8)  Past Surgical History: Last updated: 08/31/2010 S/P myringotomies  Family History: Last updated: 2010-08-04 Mother deceased  age 38  from a massive stroke Father deceased age 78 from ?cancer- heavy smoker 2 Siblings: both brothers- one died age 31 from pulm embolism after surg, & the other died age 3 from MI...  Social History: Last updated: 07/16/2010 married exsmoker  quit in 1970 smoked for 10 years  1 cig per day drinks 1 glass of wine with dinner daily 2 daughters with no significant medical hx  Review of  Systems       Denies fever, malais, weight loss, blurry vision, decreased visual acuity, cough, sputum, SOB, hemoptysis, pleuritic pain, palpitaitons, heartburn, abdominal pain, melena, lower extremity edema, claudication, or rash.   Vital Signs:  Patient profile:   77 year old female Height:      62 inches Weight:      117 pounds BMI:     21.48 Pulse rate:   67 / minute Resp:     14 per minute BP sitting:   110 / 68  (left arm)  Vitals Entered By: Kem Parkinson (October 13, 2010 3:01 PM)  Physical Exam  General:  Affect appropriate Healthy:  appears stated age HEENT: normal Neck supple with no adenopathy JVP normal no bruits no thyromegaly Lungs clear with no wheezing and good diaphragmatic motion Heart:  S1/S2 MR  murmur,rub, gallop or click PMI normal Abdomen: benighn, BS positve, no tenderness, no AAA no bruit.  No HSM or HJR Distal pulses intact with no bruits No edema Neuro non-focal Skin warm and dry    Impression & Recommendations:  Problem # 1:  MITRAL VALVE PROLAPSE (ICD-424.0) Severe MR not operative candidate.  CHF cleared Her updated medication list for this problem includes:    Digoxin 0.125 Mg Tabs (Digoxin) .Marland Kitchen... Take 1 tablet by mouth once a day    Metoprolol Tartrate 50 Mg Tabs (Metoprolol tartrate) .Marland Kitchen... Take 1 tab by mouth two times a day...    Furosemide 40 Mg Tabs (Furosemide) .Marland Kitchen... Take 1 & 1/2 tabs by mouth once daily in am...  Problem # 2:  ATRIAL FIBRILLATION, CHRONIC (ICD-427.31) Good rate control and anticoagulation.  Coumadin clinic today Her updated medication list for this problem includes:    Coumadin 2.5 Mg Tabs (Warfarin sodium) .Marland Kitchen... Take as directed by coumadin clinic.    Aspirin 81 Mg Tabs (Aspirin) .Marland Kitchen... Take 1 tablet by mouth once a day    Digoxin 0.125 Mg Tabs (Digoxin) .Marland Kitchen... Take 1 tablet by mouth once a day    Metoprolol Tartrate 50 Mg Tabs (Metoprolol tartrate) .Marland Kitchen... Take 1 tab by mouth two times a day...  Problem # 3:   HYPERCHOLESTEROLEMIA (ICD-272.0) At goal continue statin Her updated medication list for this problem includes:    Zocor 40 Mg Tabs (Simvastatin) .Marland Kitchen... Take 1 tablet by mouth once a day  CHOL: 170 (01/13/2010)   LDL: 94 (01/13/2010)   HDL: 57.20 (01/13/2010)   TG: 94.0 (01/13/2010)  Patient Instructions: 1)  Your physician wants you to follow-up in: 6 months  You will receive a reminder letter in the mail two months in advance. If you don't receive a letter, please call our office to schedule the follow-up appointment.

## 2010-10-21 NOTE — Medication Information (Signed)
Summary: ROV/appt with nishan after coumadin/sl   Anticoagulant Therapy  Managed by: Weston Brass, PharmD Referring MD: Charlies Constable MD PCP: Kriste Basque MD Supervising MD: Myrtis Ser MD, Tinnie Gens Indication 1: Atrial Fibrillation (ICD-427.31) Lab Used: [facility] Site: [facility] INR POC 2.6 INR RANGE 2 - 3  Dietary changes: no    Health status changes: no    Bleeding/hemorrhagic complications: no    Recent/future hospitalizations: no    Any changes in medication regimen? no    Recent/future dental: no  Any missed doses?: no       Is patient compliant with meds? yes       Allergies: 1)  ! Codeine  Anticoagulation Management History:      The patient is taking warfarin and comes in today for a routine follow up visit.  Positive risk factors for bleeding include an age of 75 years or older.  The bleeding index is 'intermediate risk'.  Positive CHADS2 values include History of CHF, History of HTN, and Age > 30 years old.  The start date was 03/24/2001.  Her last INR was 4.8.  Anticoagulation responsible provider: Myrtis Ser MD, Tinnie Gens.  INR POC: 2.6.  Cuvette Lot#: [id number].  Exp: 09/2011.    Anticoagulation Management Assessment/Plan:      The patient's current anticoagulation dose is Coumadin 2.5 mg tabs: Take as directed by coumadin clinic..  The target INR is 2.0-3.0.  The next INR is due 11/06/2010.  Anticoagulation instructions were given to patient.  Results were reviewed/authorized by Weston Brass, PharmD.  She was notified by Stephannie Peters, PharmD Candidate .         Prior Anticoagulation Instructions: INR:  2.1 (goal 2-3)  Your INR is at goal today.  Continue taking 1/2 a tablet everyday except for 1 tablet on Sunday and Thursday.  Return in 4 weeks for another INR check.  Current Anticoagulation Instructions: INR 2.6  Coumadin 2.5 mg tablets - Continue 1/2 tablet every day except 1 tablet on Sundays and Thursdays

## 2010-11-05 DIAGNOSIS — I4891 Unspecified atrial fibrillation: Secondary | ICD-10-CM

## 2010-11-06 ENCOUNTER — Encounter (INDEPENDENT_AMBULATORY_CARE_PROVIDER_SITE_OTHER): Payer: MEDICARE

## 2010-11-06 ENCOUNTER — Encounter: Payer: Self-pay | Admitting: Internal Medicine

## 2010-11-06 DIAGNOSIS — Z7901 Long term (current) use of anticoagulants: Secondary | ICD-10-CM

## 2010-11-06 DIAGNOSIS — I4891 Unspecified atrial fibrillation: Secondary | ICD-10-CM

## 2010-11-10 NOTE — Medication Information (Signed)
Summary: Erin Rivers   Anticoagulant Therapy  Managed by: Weston Brass, PharmD Referring MD: Charlies Constable MD PCP: Kriste Basque MD Supervising MD: Tenny Craw MD, Gunnar Fusi Indication 1: Atrial Fibrillation (ICD-427.31) Lab Used: [facility] Site: [facility] INR POC 1.9 INR RANGE 2 - 3  Dietary changes: no    Health status changes: no    Bleeding/hemorrhagic complications: no    Recent/future hospitalizations: no    Any changes in medication regimen? no    Recent/future dental: no  Any missed doses?: no       Is patient compliant with meds? yes       Allergies: 1)  ! Codeine  Anticoagulation Management History:      The patient is taking warfarin and comes in today for a routine follow up visit.  Positive risk factors for bleeding include an age of 10 years or older.  The bleeding index is 'intermediate risk'.  Positive CHADS2 values include History of CHF, History of HTN, and Age > 40 years old.  The start date was 03/24/2001.  Her last INR was 4.8.  Anticoagulation responsible provider: Tenny Craw MD, Gunnar Fusi.  INR POC: 1.9.  Cuvette Lot#: [id number].  Exp: 10/2011.    Anticoagulation Management Assessment/Plan:      The patient's current anticoagulation dose is Coumadin 2.5 mg tabs: Take as directed by coumadin clinic..  The target INR is 2.0-3.0.  The next INR is due 11/27/2010.  Anticoagulation instructions were given to patient.  Results were reviewed/authorized by Weston Brass, PharmD.  She was notified by Margot Chimes PharmD Candidate.         Prior Anticoagulation Instructions: INR 2.6  Coumadin 2.5 mg tablets - Continue 1/2 tablet every day except 1 tablet on Sundays and Thursdays   Current Anticoagulation Instructions: INR 1.9  Take 1 tablet today, and then resume your regular dose of 1/2 tablet daily except Thursdays and Sundays when you take 1 tablet.  Recheck INR in 3 weeks.

## 2010-11-23 ENCOUNTER — Telehealth (INDEPENDENT_AMBULATORY_CARE_PROVIDER_SITE_OTHER): Payer: Self-pay | Admitting: *Deleted

## 2010-11-24 ENCOUNTER — Telehealth: Payer: Self-pay | Admitting: Pulmonary Disease

## 2010-11-24 LAB — CBC
HCT: 37 % (ref 36.0–46.0)
HCT: 41.2 % (ref 36.0–46.0)
Hemoglobin: 12 g/dL (ref 12.0–15.0)
Hemoglobin: 13.5 g/dL (ref 12.0–15.0)
MCH: 31.6 pg (ref 26.0–34.0)
MCH: 32.1 pg (ref 26.0–34.0)
MCHC: 32.4 g/dL (ref 30.0–36.0)
MCV: 97.4 fL (ref 78.0–100.0)
MCV: 98.1 fL (ref 78.0–100.0)
RBC: 4.2 MIL/uL (ref 3.87–5.11)

## 2010-11-24 LAB — BASIC METABOLIC PANEL
BUN: 7 mg/dL (ref 6–23)
CO2: 22 mEq/L (ref 19–32)
Calcium: 8.8 mg/dL (ref 8.4–10.5)
Chloride: 101 mEq/L (ref 96–112)
Creatinine, Ser: 0.72 mg/dL (ref 0.4–1.2)
GFR calc Af Amer: >60 mL/min (ref >60)
GFR calc non Af Amer: >60 mL/min (ref >60)
Potassium: 3.5 mEq/L (ref 3.5–5.1)
Sodium: 134 mEq/L — ABNORMAL LOW (ref 135–145)

## 2010-11-24 LAB — COMPREHENSIVE METABOLIC PANEL
Albumin: 3.3 g/dL — ABNORMAL LOW (ref 3.5–5.2)
Alkaline Phosphatase: 58 U/L (ref 39–117)
BUN: 9 mg/dL (ref 6–23)
Calcium: 9 mg/dL (ref 8.4–10.5)
Potassium: 4.2 mEq/L (ref 3.5–5.1)
Sodium: 130 mEq/L — ABNORMAL LOW (ref 135–145)
Total Protein: 5.9 g/dL — ABNORMAL LOW (ref 6.0–8.3)

## 2010-11-24 LAB — PROTIME-INR
INR: 2.58 — ABNORMAL HIGH (ref 0.00–1.49)
Prothrombin Time: 27.4 seconds — ABNORMAL HIGH (ref 11.6–15.2)
Prothrombin Time: 30.3 seconds — ABNORMAL HIGH (ref 11.6–15.2)

## 2010-11-24 LAB — EXPECTORATED SPUTUM ASSESSMENT W GRAM STAIN, RFLX TO RESP C

## 2010-11-24 LAB — DIFFERENTIAL
Basophils Relative: 1 % (ref 0–1)
Lymphs Abs: 1 10*3/uL (ref 0.7–4.0)
Monocytes Relative: 9 % (ref 3–12)
Neutro Abs: 8.9 10*3/uL — ABNORMAL HIGH (ref 1.7–7.7)
Neutrophils Relative %: 81 % — ABNORMAL HIGH (ref 43–77)

## 2010-11-24 LAB — BRAIN NATRIURETIC PEPTIDE: Pro B Natriuretic peptide (BNP): 284 pg/mL — ABNORMAL HIGH (ref 0.0–100.0)

## 2010-11-24 LAB — CK TOTAL AND CKMB (NOT AT ARMC)
Total CK: 100 U/L (ref 7–177)
Total CK: 147 U/L (ref 7–177)

## 2010-11-24 LAB — TROPONIN I: Troponin I: 0.06 ng/mL (ref 0.00–0.06)

## 2010-11-24 LAB — TSH: TSH: 7.77 u[IU]/mL — ABNORMAL HIGH (ref 0.350–4.500)

## 2010-11-24 LAB — APTT: aPTT: 47 seconds — ABNORMAL HIGH (ref 24–37)

## 2010-11-24 LAB — T4, FREE: Free T4: 1.46 ng/dL (ref 0.80–1.80)

## 2010-11-25 ENCOUNTER — Other Ambulatory Visit: Payer: MEDICARE

## 2010-11-25 ENCOUNTER — Encounter (INDEPENDENT_AMBULATORY_CARE_PROVIDER_SITE_OTHER): Payer: Self-pay | Admitting: *Deleted

## 2010-11-25 ENCOUNTER — Other Ambulatory Visit: Payer: Self-pay | Admitting: Pulmonary Disease

## 2010-11-25 DIAGNOSIS — R0989 Other specified symptoms and signs involving the circulatory and respiratory systems: Secondary | ICD-10-CM

## 2010-11-25 DIAGNOSIS — I509 Heart failure, unspecified: Secondary | ICD-10-CM

## 2010-11-25 DIAGNOSIS — I10 Essential (primary) hypertension: Secondary | ICD-10-CM

## 2010-11-25 DIAGNOSIS — R0609 Other forms of dyspnea: Secondary | ICD-10-CM

## 2010-11-25 DIAGNOSIS — D649 Anemia, unspecified: Secondary | ICD-10-CM

## 2010-11-25 DIAGNOSIS — R748 Abnormal levels of other serum enzymes: Secondary | ICD-10-CM

## 2010-11-25 DIAGNOSIS — E7800 Pure hypercholesterolemia, unspecified: Secondary | ICD-10-CM

## 2010-11-25 DIAGNOSIS — E039 Hypothyroidism, unspecified: Secondary | ICD-10-CM

## 2010-11-25 LAB — HEPATIC FUNCTION PANEL
ALT: 18 U/L (ref 0–35)
AST: 28 U/L (ref 0–37)
Alkaline Phosphatase: 55 U/L (ref 39–117)
Bilirubin, Direct: 0.2 mg/dL (ref 0.0–0.3)
Total Bilirubin: 1 mg/dL (ref 0.3–1.2)
Total Protein: 6.4 g/dL (ref 6.0–8.3)

## 2010-11-25 LAB — CBC WITH DIFFERENTIAL/PLATELET
Basophils Absolute: 0 10*3/uL (ref 0.0–0.1)
Eosinophils Relative: 1.9 % (ref 0.0–5.0)
HCT: 36 % (ref 36.0–46.0)
Lymphs Abs: 1.4 10*3/uL (ref 0.7–4.0)
Monocytes Absolute: 0.7 10*3/uL (ref 0.1–1.0)
Monocytes Relative: 11.4 % (ref 3.0–12.0)
Neutrophils Relative %: 62.3 % (ref 43.0–77.0)
Platelets: 182 10*3/uL (ref 150.0–400.0)
RDW: 14.7 % — ABNORMAL HIGH (ref 11.5–14.6)
WBC: 5.7 10*3/uL (ref 4.5–10.5)

## 2010-11-25 LAB — BASIC METABOLIC PANEL
BUN: 18 mg/dL (ref 6–23)
Calcium: 9.5 mg/dL (ref 8.4–10.5)
Creatinine, Ser: 0.9 mg/dL (ref 0.4–1.2)
GFR: 62.86 mL/min (ref >60.00)
Glucose, Bld: 84 mg/dL (ref 70–99)

## 2010-11-25 LAB — BRAIN NATRIURETIC PEPTIDE: Pro B Natriuretic peptide (BNP): 531.7 pg/mL — ABNORMAL HIGH (ref 0.0–100.0)

## 2010-11-25 LAB — TSH: TSH: 0.75 u[IU]/mL (ref 0.35–5.50)

## 2010-11-25 LAB — LIPID PANEL
Cholesterol: 150 mg/dL (ref 0–200)
VLDL: 23.6 mg/dL (ref 0.0–40.0)

## 2010-11-27 ENCOUNTER — Encounter (INDEPENDENT_AMBULATORY_CARE_PROVIDER_SITE_OTHER): Payer: MEDICARE

## 2010-11-27 ENCOUNTER — Encounter: Payer: Self-pay | Admitting: Cardiology

## 2010-11-27 DIAGNOSIS — Z7901 Long term (current) use of anticoagulants: Secondary | ICD-10-CM

## 2010-11-27 DIAGNOSIS — I4891 Unspecified atrial fibrillation: Secondary | ICD-10-CM

## 2010-11-30 ENCOUNTER — Encounter: Payer: Self-pay | Admitting: Pulmonary Disease

## 2010-11-30 ENCOUNTER — Ambulatory Visit: Payer: Self-pay | Admitting: Pulmonary Disease

## 2010-12-01 NOTE — Progress Notes (Signed)
Summary: Lab order-LMTCB x 1  Phone Note Call from Patient Call back at Home Phone [phone]   Caller: Patient Call For: Dr Kriste Basque Summary of Call: Patient wants nurse to call regarding having labs done on same day as appointment on 3/19. Initial call taken by: Leonette Monarch,  November 23, 2010 10:31 AM  Follow-up for Phone Call        Pt requesting to have labs drawn before appt on 11-30-10. Please advsie on what labs to enter. Thanks. Carron Curie CMA  November 23, 2010 10:44 AM   Additional Follow-up for Phone Call Additional follow up Details #1::        per SN----ok for labs----lip-272.0/hepat-790.5/bmp-401.9/cbcd-285.9/tsh-244.9/bnp-428.0----thanks Randell Loop CMA  November 23, 2010 2:33 PM     Additional Follow-up for Phone Call Additional follow up Details #2::    lmomtcb Vernie Murders  November 23, 2010 2:47 PM [facility].Michel Bickers [facility]  November 23, 2010 4:38 PM   Additional Follow-up for Phone Call Additional follow up Details #3:: Details for Additional Follow-up Action Taken: Spoke with pt and sched lab appt for 3/14 am. Pt aware to be fasting.  Vernie Murders  November 24, 2010 9:34 AM

## 2010-12-01 NOTE — Medication Information (Signed)
Summary: rov/cb      Allergies Added:  Anticoagulant Therapy  Managed by: Samantha Crimes, PharmD Referring MD: Charlies Constable MD PCP: Kriste Basque MD Supervising MD: Shirlee Latch MD, [place] Lamons Indication 1: Atrial Fibrillation (ICD-427.31) Lab Used: [facility] Site: [facility] INR POC 2.7 INR RANGE 2 - 3  Dietary changes: no    Health status changes: no    Bleeding/hemorrhagic complications: no    Recent/future hospitalizations: no    Any changes in medication regimen? no    Recent/future dental: no  Any missed doses?: no       Is patient compliant with meds? yes       Current Medications (verified): 1)  Coumadin 2.5 Mg Tabs (Warfarin Sodium) .... Take As Directed By Coumadin Clinic. 2)  Aspirin 81 Mg Tabs (Aspirin) .... Take 1 Tablet By Mouth Once A Day 3)  Digoxin 0.125 Mg Tabs (Digoxin) .... Take 1 Tablet By Mouth Once A Day 4)  Metoprolol Tartrate 50 Mg  Tabs (Metoprolol Tartrate) .... Take 1 Tab By Mouth Two Times A Day... 5)  Furosemide 40 Mg Tabs (Furosemide) .... Take 1 & 1/2 Tabs By Mouth Once Daily in Am... 6)  Klor-Con M20 20 Meq Cr-Tabs (Potassium Chloride Crys Cr) .... Take One Tablet By Mouth Once Daily and 1/2 Tablet in Evening While Taking The Lasix 7)  Zocor 40 Mg Tabs (Simvastatin) .... Take 1 Tablet By Mouth Once A Day 8)  Synthroid 75 Mcg Tabs (Levothyroxine Sodium) .... Take 1 Tablet By Mouth Once A Day 9)  Boniva 150 Mg Tabs (Ibandronate Sodium) .... Take One Tablet By Mouth Once Every Month 10)  Caltrate 600+d 600-400 Mg-Unit  Tabs (Calcium Carbonate-Vitamin D) .... Take 1 Tab By Mouth Two Times A Day... 11)  Multivitamins   Tabs (Multiple Vitamin) .... Take 1 Tab By Mouth Once Daily... 12)  Vitamin C 500 Mg Tabs (Ascorbic Acid) .... Take 1 Tablet By Mouth Once A Day 13)  Vitamin D 1000 Unit Tabs (Cholecalciferol) .... Take 1 Tablet By Mouth Once A Day 14)  Vitamin E-400 400 Unit Caps (Vitamin E) .... Take 1 Tablet By Mouth Once A Day 15)  Pro-Flora Concentrate   Caps (Probiotic Product) .... Take 1 Tablet By Mouth Once A Day  Allergies (verified): 1)  ! Codeine  Anticoagulation Management History:      Positive risk factors for bleeding include an age of 90 years or older.  The bleeding index is 'intermediate risk'.  Positive CHADS2 values include History of CHF, History of HTN, and Age > 51 years old.  The start date was 03/24/2001.  Her last INR was 4.8.  Anticoagulation responsible provider: Shirlee Latch MD, Lycan Davee.  INR POC: 2.7.  Exp: 10/2011.    Anticoagulation Management Assessment/Plan:      The patient's current anticoagulation dose is Coumadin 2.5 mg tabs: Take as directed by coumadin clinic..  The target INR is 2.0-3.0.  The next INR is due 11/27/2010.  Anticoagulation instructions were given to patient.  Results were reviewed/authorized by Samantha Crimes, PharmD.         Prior Anticoagulation Instructions: INR 1.9  Take 1 tablet today, and then resume your regular dose of 1/2 tablet daily except Thursdays and Sundays when you take 1 tablet.  Recheck INR in 3 weeks.    Current Anticoagulation Instructions: Return to clinic in 4 weeks on 4/13 @ 11:15 Cont with current regimen

## 2010-12-01 NOTE — Progress Notes (Signed)
Summary: need to change appt to 3-15  Phone Note Outgoing Call   Summary of Call: called x 2 and lmomtcb for pt---pt has appt with SN on 3-19 and SN will not be in the office----need to change appt to 3-15 at 2pm. Randell Loop CMA  November 24, 2010 5:03 PM   Follow-up for Phone Call        CHANGED APPT TO 3-23 ---PT STATED THAT SHE WAS GOING OUT OF TOWN THE FOLLOWING WEEK AND WANTED TO COME IN FOR HER LAB RESULTS. PT IS AWARE OF APPT DATE AND TIME Randell Loop CMA  November 25, 2010 9:29 AM

## 2010-12-04 ENCOUNTER — Encounter: Payer: Self-pay | Admitting: Pulmonary Disease

## 2010-12-04 ENCOUNTER — Ambulatory Visit (INDEPENDENT_AMBULATORY_CARE_PROVIDER_SITE_OTHER): Payer: MEDICARE | Admitting: Pulmonary Disease

## 2010-12-04 DIAGNOSIS — M810 Age-related osteoporosis without current pathological fracture: Secondary | ICD-10-CM

## 2010-12-04 DIAGNOSIS — I5033 Acute on chronic diastolic (congestive) heart failure: Secondary | ICD-10-CM

## 2010-12-04 DIAGNOSIS — E7800 Pure hypercholesterolemia, unspecified: Secondary | ICD-10-CM

## 2010-12-04 DIAGNOSIS — E039 Hypothyroidism, unspecified: Secondary | ICD-10-CM

## 2010-12-04 NOTE — Patient Instructions (Signed)
Today we updated your med list based on our records and your med bottles (thanks for bringing!).Marland KitchenMarland Kitchen Continue everything the same for now... Be careful on your trip to [place] this May... Let's plan to see you for a follow up visit in 3 months.Marland KitchenMarland Kitchen

## 2010-12-04 NOTE — Progress Notes (Signed)
Subjective:    Patient ID: Erin Rivers, female    DOB: 12-01-1920, 77 y.o.   MRN: [id number]  HPI 77 y/o WF here for a follow up visit... she has multiple medical problems including:  CHF w/ severe MR and chr AFib, Hyperlipidemia, Hypothyroid, Osteopenia, etc... she is followed for Cards by Walker Kehr.  ~  July 29, 2010:  post hosp check- improved after diuresis > eval revealed severe MR & pulmHTN (NOTE: CXR w/ cardiomeg & basilar changes, Labs w/ Hg=10.9, BNP=1089 > admitted for diuresis & 2DEcho showed severe MR, mild LVH, EF 55-60%, & pulm HTN w/ PAsys=55); sent home on Dig (level= 1.6 on 0.125mg /d) & Lasix 40mg /d w/ f/u BNP today = 822 & Creat= 0.9, so we will incr her Lasix to 60mg /d... they also incr her Synthroid from 50 to 70mcg/d due to TSH in the 5-7 range... heart rate today= 74/min w/ her current meds... plan f/u 8mo.  ~  August 31, 2010:  we incr the Lasix to 60mg  Qam but weight is up 2# to 118#, no swelling, breathing is good, no CP etc... she had episode epistaxis w/ eval ENT- DrBates, & he cauterized it... may need further incr in Lasix but she is feeling well now & BP= 112/64, therefore continue same meds for now & plan f/u w/ Cards 1/12 & w/ me 3/12...  ~  December 04, 2010:  12mo ROV- she is improved, walking daily & planning trip to [place] at the end of May...    Cards>  She has chr AFib, Diastolic HF, severe MVP/ MR, & pulmHTN; on Metoprolol, Digoxin, Lasix/ KCl, Coumadin, ASA; she saw [facility] 1/12 & he wanted her to discuss advanced directive- she has living will, she is not ready to think about NCB/ hospice care/ etc...  Labs today w/ Chems= normal, BNP= 531; Rec- continue same meds.    Chol>  On Simva40 + low fat diet & FLP today shows TChol 150, TG 118, HDL 43, LDL 84; rec- continue same...    Thyroid>  Hx hypothyroid on Synthroid 76mcg/d;  TSH today= 0.75, tol well w/o palpit etc (continue same)...    Osteopenia>  On Boniva monthly + calcium, MVI, Vit D 100u daily;  Last  BMD w/ TScore -1.8 in Spine, due for f/u but she wants to wait.  Health Maint:  she has never had a colonoscopy (now in her 68's & on Coumadin); no hx of GI problems & Hg= 13.0 (5/09) & 12.5 (5/10)... she gets the seasonal Flu vaccine yearly.   Outpatient Encounter Prescriptions as of 12/04/2010  Medication Sig Dispense Refill  . Ascorbic Acid (VITAMIN C) 500 MG tablet Take 500 mg by mouth daily.        Marland Kitchen aspirin 81 MG tablet Take 81 mg by mouth daily.        . Calcium Carbonate-Vitamin D (CALTRATE 600+D) 600-400 MG-UNIT per tablet Take 1 tablet by mouth daily.        . Cholecalciferol (VITAMIN D) 1000 UNITS capsule Take 1,000 Units by mouth daily.        . digoxin (LANOXIN) 0.125 MG tablet Take 125 mcg by mouth daily.        . furosemide (LASIX) 40 MG tablet Take 1 1/2 tablets daily in am       . ibandronate (BONIVA) 150 MG tablet Take 150 mg by mouth every 30 (thirty) days. Take in the morning with a full glass of water, on an empty stomach, and do not  take anything else by mouth or lie down for the next 30 min.       Marland Kitchen levothyroxine (SYNTHROID, LEVOTHROID) 75 MCG tablet Take 75 mcg by mouth daily.        . metoprolol (LOPRESSOR) 50 MG tablet Take 50 mg by mouth 2 (two) times daily.        . potassium chloride (KLOR-CON) 20 MEQ packet Take 1 tablet in am and 1/2 in pm while taking lasix       . Probiotic Product (PRO-FLORA CONCENTRATE) CAPS Take 1 capsule by mouth daily.        . simvastatin (ZOCOR) 40 MG tablet Take 40 mg by mouth at bedtime.        . vitamin E 400 UNIT capsule Take 400 Units by mouth daily.        Marland Kitchen warfarin (COUMADIN) 2.5 MG tablet Take by mouth as directed.        . Multiple Vitamin (MULTIVITAMIN) capsule Take 1 capsule by mouth daily.          Allergies  Allergen Reactions  . Codeine     REACTION: Nausea    Review of Systems        See HPI       The patient complains of dyspnea on exertion.  The patient denies anorexia, fever, weight loss, weight gain, vision  loss, decreased hearing, hoarseness, chest pain, syncope, peripheral edema, prolonged cough, headaches, hemoptysis, abdominal pain, melena, hematochezia, severe indigestion/heartburn, hematuria, incontinence, muscle weakness, suspicious skin lesions, transient blindness, difficulty walking, depression, unusual weight change, abnormal bleeding, enlarged lymph nodes, and angioedema.     Objective:   Physical Exam     WD, WN, 77 y/o WF in NAD... GENERAL:  Alert & oriented; pleasant & cooperative... HEENT:  [place]/AT, EOM-wnl, PERRLA, EACs-clear, TMs-wnl, NOSE-clear, THROAT-clear & wnl. NECK:  Supple w/ fairROM; no JVD; normal carotid impulses w/o bruits; no thyromegaly or nodules palpated; no lymphadenopathy. CHEST:  Decr BS bilat w/ few basilar rales & scat rhonchi, not coughing, no wheezing, no consolidation. HEART:  irreg AFib w/ control VR; gr 3/6 sys murmur at apex, no rubs or gallops... ABDOMEN:  Soft & nontender; normal bowel sounds; no organomegaly or masses detected. EXT: without deformities, mild arthritic changes; no varicose veins/ venous insuffic/ or edema. NEURO:  CN's intact; motor testing normal; sensory testing normal; gait normal & balance OK. DERM:  No lesions noted; no rash etc...   Assessment & Plan:

## 2010-12-09 ENCOUNTER — Encounter: Payer: Self-pay | Admitting: Pulmonary Disease

## 2010-12-09 NOTE — Assessment & Plan Note (Signed)
11mo ROV & improved overall, walking daily & planning trip to [place] in May;  BNP continues to improve (currently on Lasix 60mg /d);  rec to continue same meds and exerc program..Marland Kitchen

## 2010-12-09 NOTE — Assessment & Plan Note (Signed)
She remains stable on the Simva40- well tolerated and FLP at goal w/ this + diet;  Continue same.Marland KitchenMarland Kitchen

## 2010-12-09 NOTE — Assessment & Plan Note (Signed)
As noted her TSH is down to 0.75 on the incr Synthroid dose (incr to 69mcg/d while in the hosp 11/11);  Tolerating well w/o CP, palpit, sweating, etc;  Plan to continue the same dose for now & watch closely for problems.Marland KitchenMarland Kitchen

## 2010-12-09 NOTE — Assessment & Plan Note (Signed)
Stable on the Boniva, calcium, MVI, Vit D, etc;  She is due for f/u BMD but wants to wait & we will re-address on return;  Continue current med & wt bearing exercise.Marland KitchenMarland Kitchen

## 2010-12-14 ENCOUNTER — Telehealth: Payer: Self-pay | Admitting: Pulmonary Disease

## 2010-12-14 NOTE — Telephone Encounter (Signed)
lmomtcb x1 

## 2010-12-14 NOTE — Telephone Encounter (Signed)
Spouse Erin Rivers returned call from triage

## 2010-12-15 NOTE — Telephone Encounter (Signed)
lmomtcb for pt or her husband

## 2010-12-16 MED ORDER — POTASSIUM CHLORIDE 20 MEQ PO PACK: PACK | ORAL | Status: DC

## 2010-12-16 MED ORDER — FUROSEMIDE 40 MG PO TABS: ORAL_TABLET | ORAL | Status: DC

## 2010-12-16 NOTE — Telephone Encounter (Signed)
Erin Rivers phoned stated that he was returning Erin Rivers's call from yesterday regarding Erin Rivers he can be reached at [phone].Vedia Coffer

## 2010-12-16 NOTE — Telephone Encounter (Signed)
Spoke with pts husband and he is aware of rx sent into [facility] to for new # of lasix and potassium due to pts directions from last ov.  Pt will call for any questions or concerns

## 2010-12-25 ENCOUNTER — Ambulatory Visit (INDEPENDENT_AMBULATORY_CARE_PROVIDER_SITE_OTHER): Payer: Medicare Other | Admitting: *Deleted

## 2010-12-25 DIAGNOSIS — I4891 Unspecified atrial fibrillation: Secondary | ICD-10-CM

## 2010-12-25 LAB — POCT INR: INR: 2

## 2011-01-05 ENCOUNTER — Other Ambulatory Visit: Payer: Self-pay | Admitting: Pulmonary Disease

## 2011-01-05 ENCOUNTER — Telehealth: Payer: Self-pay | Admitting: Pulmonary Disease

## 2011-01-05 NOTE — Telephone Encounter (Signed)
Advised pt os SN recs. Pt states she is not good at doing her self exams so she will continue to just go every year. Nothing further was needed

## 2011-01-05 NOTE — Telephone Encounter (Signed)
Pt wants to know if she really needs to continue getting yearly mammograms at the age of 75. Pls advise.

## 2011-01-05 NOTE — Telephone Encounter (Signed)
Per SN----she can change to every other year as long as she does her self exams and no lumps are felt. thanks

## 2011-01-12 ENCOUNTER — Telehealth: Payer: Self-pay | Admitting: Pulmonary Disease

## 2011-01-12 NOTE — Telephone Encounter (Signed)
Error.Erin Rivers ° ° °

## 2011-01-14 ENCOUNTER — Ambulatory Visit: Payer: Self-pay | Admitting: Pulmonary Disease

## 2011-01-22 ENCOUNTER — Ambulatory Visit (INDEPENDENT_AMBULATORY_CARE_PROVIDER_SITE_OTHER): Payer: Medicare Other | Admitting: *Deleted

## 2011-01-22 DIAGNOSIS — I4891 Unspecified atrial fibrillation: Secondary | ICD-10-CM

## 2011-01-22 LAB — POCT INR: INR: 1.9

## 2011-01-26 NOTE — Assessment & Plan Note (Signed)
[facility]                            CARDIOLOGY OFFICE NOTE   Erin Rivers, Erin Rivers                        MRN:          [id number]  DATE:03/02/2007                            DOB:          1921/08/24    Erin Rivers is an 77 year old patient of Dr.  Jodelle Green. She is referred for  atrial fibrillation and mitral valve prolapse with moderate severe  mitral regurgitation.   In talking to the patient, she has had a longstanding history of click  murmur. There is a note from Dr.  Juanda Chance back in 2002, for new onset  atrial fibrillation.   At that point, he recommended rate control and anti-coagulation, which  the patient has done over the last six years and done extremely well.   She had a 2D echocardiogram performed on February 21, 2007. It was read by  Dr.  Gala Romney. Her EF was normal. There was bi-leaflet prolapse with  moderate to severe MR.   She has had known prolapse for quite some time, at least since 2001, as  far as I can tell. She has had atrial fibrillation since she saw Dr.  Juanda Chance in 2002. In talking to [facility], she is fairly asymptomatic. She  does everything she wants to do. She walks on a regular basis. There has  been no episodes of congestive heart failure or lower extremity edema.  No PND or orthopnea. The patient does not really get palpitations. She  has never had a TIA or CVA. Her Coumadin levels have not been that hard  to control.   In regards to her mitral, she had previously been on SB prophylaxis,  although it is not recommended at this time. Her dentition is fine. She  has never had endocarditis.   She did not have an intervening echo between 2002 and 2008.   Her review of systems is otherwise remarkable for recent episode of  shingles on the right lower back area. She was placed on multiple  medications for this. She was concerned about an interaction with  Coumadin. I told her we would check her INR today since it has been  two  weeks.   PAST MEDICAL HISTORY:  Otherwise, benign. She has had atrial  fibrillation. She has had hypothyroidism. She has had mitral valve  prolapse with MR. She has had some myringotomies. She has  hypercholesterolemia. She has not had any previous surgery.   FAMILY HISTORY:  Is noncontributory.   REVIEW OF SYSTEMS:  Is otherwise negative.   She is happily married. Her husband was with her. She is originally from  [place]. She grew up in [place], [place] as a G[place] baby. She  currently is retired and enjoys traveling. She just returned from a trip  from [place]. She does not drink or smoke.   PHYSICAL EXAMINATION:  She appears her stated age. She is spry. She is  in no distress. Respiratory rate is 14. She is afebrile. Weight is 124,  blood pressure is 130/88, pulse 86 and irregular.  HEENT: Is normal. No carotid bruits. No thyromegaly. No lymphadenopathy.  No bruits.  LUNGS:  Are clear with good diaphragmatic motion. No wheezing.  She has an S1, S2 with an aortic sclerosis murmur and an MR murmur. PMI  is increased, but not laterally displaced.  ABDOMEN: Is benign. There is no hepatosplenomegaly. No hepatojugular  reflux. No AAA. Bowel sounds are positive.  Femorals are +2 bilaterally without bruit. PT's are +3 bilaterally with  no lower extremity edema. Minor varicosities.  NEURO: Is nonfocal. There is no muscular weakness.  She has a healing shingles rash just above the right buttocks area.   MEDICATIONS:  1. Zocor 40 a day.  2. Vitamins.  3. Actonel 35 once a week.  4. Coumadin as directed.  5. Lopressor 50 b.i.d.  6. Synthroid 50 mcg a day.   NKDA   IMPRESSION:  1. I had a long discussion with Erin Rivers regarding her diagnosis of      mitral valve prolapse, MR and atrial fibrillation. She primarily      needed education about this. She in regards to her MR, she is      asymptomatic. Her left ventricular function is good. There is no      indication to proceed  with surgery at this time particularly given      her advanced age.  2. Atrial fibrillation. Longterm reasonable rate control on current      dose of Lopressor. Good anticoagulation. No change in therapy.  3. Hypercholesterolemia. Continue Zocor. Followup lipid and liver      profile in six months.  4. Recent shingles rash. To continue medication given by Dr.  Kriste Basque.      Check INR and pro time today to make sure that there was no      interaction.  5. Hypothyroidism. Continue 50 mcg of Synthroid. Followup TSH and T4      in six months.   Erin Rivers will call us if she were to have any significant episodes of  shortness of breath or congestive heart failure. Otherwise, she can  followup with Dr.  Kriste Basque and it was a pleasure  meeting this patient today as she is truly charming. Her EKG shows  basically atrial fibrillation with no other abnormalities.     Noralyn Pick. Eden Emms, MD, [facility]  Electronically Signed    PCN/MedQ  DD: 03/02/2007  DT: 03/02/2007  Job #: [id number]   cc:   Lonzo Cloud. Kriste Basque, MD

## 2011-01-31 ENCOUNTER — Other Ambulatory Visit: Payer: Self-pay | Admitting: Pulmonary Disease

## 2011-02-15 ENCOUNTER — Ambulatory Visit (HOSPITAL_COMMUNITY)
Admission: RE | Admit: 2011-02-15 | Discharge: 2011-02-15 | Disposition: A | Discharge status: Home / Self Care | Payer: Medicare Other | Source: Ambulatory Visit | Attending: Pulmonary Disease | Admitting: Pulmonary Disease

## 2011-02-15 DIAGNOSIS — Z1231 Encounter for screening mammogram for malignant neoplasm of breast: Secondary | ICD-10-CM | POA: Insufficient documentation

## 2011-02-17 ENCOUNTER — Other Ambulatory Visit: Payer: Self-pay | Admitting: Pulmonary Disease

## 2011-02-17 ENCOUNTER — Telehealth: Payer: Self-pay | Admitting: Pulmonary Disease

## 2011-02-17 NOTE — Telephone Encounter (Signed)
lmomtcb  

## 2011-02-18 ENCOUNTER — Telehealth: Payer: Self-pay | Admitting: *Deleted

## 2011-02-18 NOTE — Telephone Encounter (Signed)
ERROR

## 2011-02-18 NOTE — Telephone Encounter (Signed)
Spoke with Erin Rivers-states he had questions about refill for medications-was told to have pharmacy send refill request when needed.Erin Rivers  Understands

## 2011-02-19 ENCOUNTER — Ambulatory Visit (INDEPENDENT_AMBULATORY_CARE_PROVIDER_SITE_OTHER): Payer: Medicare Other | Admitting: *Deleted

## 2011-02-19 DIAGNOSIS — I4891 Unspecified atrial fibrillation: Secondary | ICD-10-CM

## 2011-02-19 MED ORDER — WARFARIN SODIUM 2.5 MG PO TABS: 2.5000 mg | ORAL_TABLET | ORAL | Status: DC

## 2011-02-22 ENCOUNTER — Other Ambulatory Visit: Payer: Self-pay | Admitting: Pharmacist

## 2011-02-22 MED ORDER — WARFARIN SODIUM 2.5 MG PO TABS: 2.5000 mg | ORAL_TABLET | ORAL | Status: DC

## 2011-02-24 ENCOUNTER — Other Ambulatory Visit: Payer: Self-pay | Admitting: Pulmonary Disease

## 2011-02-25 ENCOUNTER — Other Ambulatory Visit: Payer: Self-pay | Admitting: *Deleted

## 2011-02-25 MED ORDER — IBANDRONATE SODIUM 150 MG PO TABS: 150.0000 mg | ORAL_TABLET | ORAL | Status: DC

## 2011-03-05 ENCOUNTER — Ambulatory Visit (INDEPENDENT_AMBULATORY_CARE_PROVIDER_SITE_OTHER): Payer: Medicare Other | Admitting: *Deleted

## 2011-03-05 DIAGNOSIS — I4891 Unspecified atrial fibrillation: Secondary | ICD-10-CM

## 2011-03-05 LAB — POCT INR: INR: 2.2

## 2011-03-08 ENCOUNTER — Encounter: Payer: Self-pay | Admitting: Pulmonary Disease

## 2011-03-10 ENCOUNTER — Other Ambulatory Visit (INDEPENDENT_AMBULATORY_CARE_PROVIDER_SITE_OTHER): Payer: Medicare Other

## 2011-03-10 ENCOUNTER — Ambulatory Visit (INDEPENDENT_AMBULATORY_CARE_PROVIDER_SITE_OTHER): Payer: Medicare Other | Admitting: Pulmonary Disease

## 2011-03-10 DIAGNOSIS — I4891 Unspecified atrial fibrillation: Secondary | ICD-10-CM

## 2011-03-10 DIAGNOSIS — R0609 Other forms of dyspnea: Secondary | ICD-10-CM

## 2011-03-10 DIAGNOSIS — H9190 Unspecified hearing loss, unspecified ear: Secondary | ICD-10-CM

## 2011-03-10 DIAGNOSIS — I5033 Acute on chronic diastolic (congestive) heart failure: Secondary | ICD-10-CM

## 2011-03-10 DIAGNOSIS — I080 Rheumatic disorders of both mitral and aortic valves: Secondary | ICD-10-CM

## 2011-03-10 DIAGNOSIS — E039 Hypothyroidism, unspecified: Secondary | ICD-10-CM

## 2011-03-10 DIAGNOSIS — D649 Anemia, unspecified: Secondary | ICD-10-CM

## 2011-03-10 DIAGNOSIS — M810 Age-related osteoporosis without current pathological fracture: Secondary | ICD-10-CM

## 2011-03-10 DIAGNOSIS — R0989 Other specified symptoms and signs involving the circulatory and respiratory systems: Secondary | ICD-10-CM

## 2011-03-10 DIAGNOSIS — M545 Low back pain: Secondary | ICD-10-CM

## 2011-03-10 DIAGNOSIS — E7800 Pure hypercholesterolemia, unspecified: Secondary | ICD-10-CM

## 2011-03-10 LAB — CBC WITH DIFFERENTIAL/PLATELET
Basophils Relative: 0.7 % (ref 0.0–3.0)
Eosinophils Relative: 1.9 % (ref 0.0–5.0)
Hemoglobin: 12 g/dL (ref 12.0–15.0)
Lymphocytes Relative: 24.2 % (ref 12.0–46.0)
MCV: 96 fl (ref 78.0–100.0)
Monocytes Absolute: 0.8 10*3/uL (ref 0.1–1.0)
Neutro Abs: 3.9 10*3/uL (ref 1.4–7.7)
Neutrophils Relative %: 60 % (ref 43.0–77.0)
RBC: 3.75 Mil/uL — ABNORMAL LOW (ref 3.87–5.11)
WBC: 6.4 10*3/uL (ref 4.5–10.5)

## 2011-03-10 LAB — BASIC METABOLIC PANEL
Chloride: 102 mEq/L (ref 96–112)
Creatinine, Ser: 0.7 mg/dL (ref 0.4–1.2)
Sodium: 139 mEq/L (ref 135–145)

## 2011-03-10 NOTE — Patient Instructions (Signed)
Today we updated your med list in EPIC>    Continue your current meds the same for now...  Today we did your follow up blood work...    Please call the PHONE TREE in a few days for your results...    Dial [id number] & when prompted enter your patient number followed by the # symbol...    Your patient number is:  [id number]#  Stay as active as possible, and be safe...  Call for any questions...  Let's plan another follow up visit in about 3-4 months, sooner if needed for problems.Marland KitchenMarland Kitchen

## 2011-03-10 NOTE — Progress Notes (Signed)
Subjective:    Patient ID: Erin Rivers, female    DOB: 04-23-1921, 77 y.o.   MRN: [id number]  HPI 77 y/o WF here for a follow up visit... she has multiple medical problems including:  CHF w/ severe MR and chr AFib, Hyperlipidemia, Hypothyroid, Osteopenia, etc... she is followed for Cards by Walker Kehr.  ~  July 29, 2010:  post hosp check- improved after diuresis > eval revealed severe MR & pulmHTN (NOTE: CXR w/ cardiomeg & basilar changes, Labs w/ Hg=10.9, BNP=1089 > admitted for diuresis & 2DEcho showed severe MR, mild LVH, EF 55-60%, & pulm HTN w/ PAsys=55); sent home on Dig (level= 1.6 on 0.125mg /d) & Lasix 40mg /d w/ f/u BNP today = 822 & Creat= 0.9, so we will incr her Lasix to 60mg /d... they also incr her Synthroid from 50 to 80mcg/d due to TSH in the 5-7 range... heart rate today= 74/min w/ her current meds... plan f/u 12mo.  ~  August 31, 2010:  we incr the Lasix to 60mg  Qam but weight is up 2# to 118#, no swelling, breathing is good, no CP etc... she had episode epistaxis w/ eval ENT- DrBates, & he cauterized it... may need further incr in Lasix but she is feeling well now & BP= 112/64, therefore continue same meds for now & plan f/u w/ Cards 1/12 & w/ me 3/12...  ~  December 04, 2010:  27mo ROV- she is improved, walking daily & planning trip to [place] at the end of May...    Cards>  She has chr AFib, Diastolic HF, severe MVP/ MR, & pulmHTN; on Metoprolol, Digoxin, Lasix/ KCl, Coumadin, ASA; she saw [facility] 1/12 & he wanted her to discuss advanced directive- she has living will, she is not ready to think about NCB/ hospice care/ etc...  Labs today w/ Chems= normal, BNP= 531; Rec- continue same meds.    Chol>  On Simva40 + low fat diet & FLP today shows TChol 150, TG 118, HDL 43, LDL 84; rec- continue same...    Thyroid>  Hx hypothyroid on Synthroid 55mcg/d;  TSH today= 0.75, tol well w/o palpit etc (continue same)...    Osteopenia>  On Boniva monthly + calcium, MVI, Vit D 100u daily;  Last  BMD w/ TScore -1.8 in Spine, due for f/u but she wants to wait.  ~  March 10, 2011:  27mo ROV & she is doing well, had a wonderful trip to [place] w/o any difficulty reported;  Breathing well & denies cough, sputum, ch in DOE etc;  Denies CP, palpit, syncope, etc;  Stable on her current meds for AFib DD, MR & PulmHTN...    LABS today showed BMet=wnl, CBC=ok, & BNP= 430 on Lasix 40mg - taking 1.5 tabs daily> no changes made today.   Problem List:    HEARING LOSS (ICD-389.9) - she wears digital hearing aides...  ALLERGIC RHINITIS (ICD-477.9) - uses OTC meds Prn...  Hx of POSITIVE PPD (ICD-795.5) - no known Tb exposure... asymptomatic without cough, sputum, hemoptysis, etc... 11/11 c/o mild dyspnea but more trouble getting air "in"... ~  CXR 11/09 mild cardiomeg, atherosclerotic Ao, clear lungs, mild scoliosis. ~  CXR 11/10 showed similar- cardiomeg, sl incr markings, NAD. ~  CXR 11/11 showed similar cardiomeg but R>L basilar airsp dis vs atx vs effusion (improved w/ diuresis).  MITRAL VALVE PROLAPSE (ICD-424.0) - MITRAL REGURGITATION (ICD-396.3) - ATRIAL FIBRILLATION, CHRONIC (ICD-427.31) - she was followed by Walker Kehr for Cardiology- his notes are reviewed... AFib dx in 2002 treated w/ rate  control on METOPROLOL 50mg Bid, ASA 81mg /d and COUMADIN- followed in the coumadin clinic... l2DEcho 6/08 showed bileaflet MVP w/ mod to severe MR...  ~  5/11:  she remains asymptomatic without CP, palpit, dizziness, syncope, dyspnea, or edema... no symptoms of TIA/ stroke... she remains very active and doing well- walks >48mi per day... ~  11/11:  presents w/ some incr SOB- w/ activ & at rest, can't get a DB, sighs help, min cough, no sputum, no f/c/s, feels "tight" but no wheezing... **NOTE> CXR w/ cardiomeg & basilar edema, Labs w/ Hg=10.9, BNP>1000... Adm for diuresis- 2DEcho showed mild LVH, EF=55-60%, no regional wall motion abn, severe MR, pulmHTN w/ PAsys=55... disch on DIGOXIN 0.125mg /d & LASIX 40mg /d... ~   Post hosp check:  improved overall, Dig= 1.6, BNP= 822, Creat= 0.9> rec incr Lasix to 60mg /d & K20 to 1.5 tabs/d as well. ~  6/12:  Follow up improved, had great trip to [place] w/o complic & stable on Lasix 60mg /d...  HYPERLIPIDEMIA (ICD-272.4) - on SIMVASTATIN 40mg /d... ~  FLP 5/09 showed TChol 181, TG 95, HDL 54, LDL 108... same med, & low fat diet. ~  FLP 5/10 showed TChol 168, TG 86, HDL 57, LDL 94 ~  FLP 5/11 showed TChol 170, TG 94, HDL 57, LDL 94  HYPOTHYROIDISM (ICD-244.9) - now on SYNTHROID 13mcg/d... clinically euthyroid & asymptomatic... ~  labs 5/09 showed TSH= 3.71... continue same med. ~  labs 5/10 showed TSH= 3.50 ~  labs 5/11 showed TSH= 5.36 ~  labs 11/11 showed TSH= 5.43 ~  labs in hosp showed TSH= 7.77 but FreeT3 & FreeT4 were WNL, they incr Synthroid to 51mcg/d anyway & we will watch HR & recheck.  LOW BACK PAIN, CHRONIC (ICD-724.2) - she has seen a chiropractor in the past, and used Vicodin, Tramadol... currently doing satis w/ her exercise program and using OTC meds only as needed.  OSTEOPOROSIS (ICD-733.00) - prev on Actonel 35mg /wk, but changed to BONIVA 150mg /mo per new insurance co ([employer]); plus Calcium/ Vits/ etc... ~  BMD 11/07 showed TScores -1.5 sp, and -1.3 hips... sl improved from 2004 on rx. ~  BMD 12/09 showed TScores -1.8 Spine, & -1.0 left FemNeck... continue Boniva.  ANEMIA (ICD-285.9) ~  labs 5/10 showed Hg= 12.5, MCV= 102 ~  labs 5/11 showed Hg= 12.5, MCV= 99 ~  11/11:  presented w/ dyspnea & Hg=10.9 ?etiology, noanemia w/u done in hosp as f/u labs showed Hg=12-13.  SHINGLES, HX OF (ICD-V13.8) - we briefly discussed the shingles vaccine...  Health Maint:  she has never had a colonoscopy (now in her 69's & on Coumadin); no hx of GI problems & Hg= 13.0 (5/09) & 12.5 (5/10)... she gets the seasonal Flu vaccine yearly.   Past Surgical History  Procedure Date  . Myringotomies     Outpatient Encounter Prescriptions as of 03/10/2011    Medication Sig Dispense Refill  . Ascorbic Acid (VITAMIN C) 500 MG tablet Take 500 mg by mouth daily.        Marland Kitchen aspirin 81 MG tablet Take 81 mg by mouth daily.        . Calcium Carbonate-Vitamin D (CALTRATE 600+D) 600-400 MG-UNIT per tablet Take 1 tablet by mouth daily.        . Cholecalciferol (VITAMIN D) 1000 UNITS capsule Take 1,000 Units by mouth daily.        . digoxin (LANOXIN) 0.125 MG tablet Take 125 mcg by mouth daily.        . furosemide (LASIX) 40  MG tablet Take 1 1/2 tablets daily in am  135 tablet  3  . ibandronate (BONIVA) 150 MG tablet Take 1 tablet (150 mg total) by mouth every 30 (thirty) days.  3 tablet  0  . levothyroxine (SYNTHROID, LEVOTHROID) 75 MCG tablet Take 75 mcg by mouth daily.        . metoprolol (LOPRESSOR) 50 MG tablet TAKE 1 TABLET TWICE A DAY  180 tablet  49  . Multiple Vitamin (MULTIVITAMIN) capsule Take 1 capsule by mouth daily.        . potassium chloride (KLOR-CON) 20 MEQ packet Take 1 tablet in am and 1/2 in pm while taking lasix  135 tablet  3  . Probiotic Product (PRO-FLORA CONCENTRATE) CAPS Take 1 capsule by mouth daily.        . simvastatin (ZOCOR) 40 MG tablet TAKE 1 TABLET ONCE A DAY  90 tablet  49  . vitamin E 400 UNIT capsule Take 400 Units by mouth daily.        Marland Kitchen warfarin (COUMADIN) 2.5 MG tablet Take 1 tablet (2.5 mg total) by mouth as directed.  14 tablet  0    Allergies  Allergen Reactions  . Codeine     REACTION: Nausea    Review of Systems        See HPI       The patient complains of dyspnea on exertion.  The patient denies anorexia, fever, weight loss, weight gain, vision loss, decreased hearing, hoarseness, chest pain, syncope, peripheral edema, prolonged cough, headaches, hemoptysis, abdominal pain, melena, hematochezia, severe indigestion/heartburn, hematuria, incontinence, muscle weakness, suspicious skin lesions, transient blindness, difficulty walking, depression, unusual weight change, abnormal bleeding, enlarged lymph nodes,  and angioedema.     Objective:   Physical Exam     WD, WN, 77 y/o WF in NAD... GENERAL:  Alert & oriented; pleasant & cooperative... HEENT:  [place]/AT, EOM-wnl, PERRLA, EACs-clear, TMs-wnl, NOSE-clear, THROAT-clear & wnl. NECK:  Supple w/ fairROM; no JVD; normal carotid impulses w/o bruits; no thyromegaly or nodules palpated; no lymphadenopathy. CHEST:  Decr BS bilat w/ few basilar rales & scat rhonchi, not coughing, no wheezing, no consolidation. HEART:  irreg AFib w/ control VR; gr 3/6 sys murmur at apex, no rubs or gallops... ABDOMEN:  Soft & nontender; normal bowel sounds; no organomegaly or masses detected. EXT: without deformities, mild arthritic changes; no varicose veins/ venous insuffic/ or edema. NEURO:  CN's intact; motor testing normal; sensory testing normal; gait normal & balance OK. DERM:  No lesions noted; no rash etc...   Assessment & Plan:   DYSPNEA, multifactorial>  This is improved, diuresis has helped, rec to continue exercise...  CHF, Diastolic dysfunction>  Continue Lasix 40mg - taking 1.5 tabs daily (same for KCl)...  AFib>  Followed by Walker Kehr, on coumadin in the CC, continue same meds & incr exercise...  HYPERLIPID>  Stable on Simva40 + diet etc...  HYPOTHYROID>  She remains stable on the Synthroid 4mcg/d...  DJD, LBP, Osteopenia>  Stable on her exerc program + Boniva etc...  Anemia>  Hg is stable ~12 range, continue vit supplements & watch for any bleeding.Marland KitchenMarland Kitchen

## 2011-03-23 ENCOUNTER — Encounter: Payer: Self-pay | Admitting: Pulmonary Disease

## 2011-04-02 ENCOUNTER — Encounter: Payer: Medicare Other | Admitting: *Deleted

## 2011-04-05 ENCOUNTER — Ambulatory Visit (INDEPENDENT_AMBULATORY_CARE_PROVIDER_SITE_OTHER): Payer: Medicare Other | Admitting: *Deleted

## 2011-04-05 DIAGNOSIS — I4891 Unspecified atrial fibrillation: Secondary | ICD-10-CM

## 2011-04-05 LAB — POCT INR: INR: 2.5

## 2011-04-30 ENCOUNTER — Ambulatory Visit (INDEPENDENT_AMBULATORY_CARE_PROVIDER_SITE_OTHER): Payer: Medicare Other | Admitting: *Deleted

## 2011-04-30 DIAGNOSIS — I4891 Unspecified atrial fibrillation: Secondary | ICD-10-CM

## 2011-04-30 LAB — POCT INR: INR: 2.2

## 2011-05-03 ENCOUNTER — Encounter: Payer: Self-pay | Admitting: Cardiovascular Disease

## 2011-05-04 ENCOUNTER — Other Ambulatory Visit: Payer: Self-pay | Admitting: Pulmonary Disease

## 2011-05-24 ENCOUNTER — Ambulatory Visit (INDEPENDENT_AMBULATORY_CARE_PROVIDER_SITE_OTHER): Payer: Medicare Other | Admitting: Cardiovascular Disease

## 2011-05-24 ENCOUNTER — Ambulatory Visit (INDEPENDENT_AMBULATORY_CARE_PROVIDER_SITE_OTHER): Payer: Medicare Other | Admitting: *Deleted

## 2011-05-24 ENCOUNTER — Encounter: Payer: Self-pay | Admitting: Cardiovascular Disease

## 2011-05-24 DIAGNOSIS — I4891 Unspecified atrial fibrillation: Secondary | ICD-10-CM

## 2011-05-24 DIAGNOSIS — E785 Hyperlipidemia, unspecified: Secondary | ICD-10-CM

## 2011-05-24 DIAGNOSIS — I080 Rheumatic disorders of both mitral and aortic valves: Secondary | ICD-10-CM

## 2011-05-24 NOTE — Assessment & Plan Note (Signed)
Good rate control and anticoagulation.  No bleeding diathesis.  F/U coumadin clinic

## 2011-05-24 NOTE — Patient Instructions (Addendum)
Your physician recommends that you schedule a follow-up appointment in: 6 MONTHS WITH DR NISHAN  Your physician recommends that you continue on your current medications as directed. Please refer to the Current Medication list given to you today. 

## 2011-05-24 NOTE — Progress Notes (Signed)
Erin Rivers is a long time patient of Dr Kriste Basque. She has seen Dr Juanda Chance and Tenny Craw in cardiology. She was hospitalized in 11/11 for pneumonia and ? diastolic failure. She has chronic afib since 2002 that Dr Juanda Chance elected to Rx with rate control and anticoagulation. Last echo 11/11 showed severe biatrial enlargement with severe MR bileaflet prolapse and normal LV function and EF. Discussed advanced directives which she really hadn't thought about. Encouraged her to discuss this further with Dr Kriste Basque..  Had a great visit to [place] to see her grand daughter  ROS: Denies fever, malais, weight loss, blurry vision, decreased visual acuity, cough, sputum, SOB, hemoptysis, pleuritic pain, palpitaitons, heartburn, abdominal pain, melena, lower extremity edema, claudication, or rash.  All other systems reviewed and negative  General: Affect appropriate Healthy:  appears stated age HEENT: normal Neck supple with no adenopathy JVP normal no bruits no thyromegaly Lungs clear with no wheezing and good diaphragmatic motion Heart:  S1/S2 MR murmur no ,rub, gallop or click PMI normal Abdomen: benighn, BS positve, no tenderness, no AAA no bruit.  No HSM or HJR Distal pulses intact with no bruits No edema Neuro non-focal Skin warm and dry No muscular weakness   Current Outpatient Prescriptions  Medication Sig Dispense Refill  . Ascorbic Acid (VITAMIN C) 500 MG tablet Take 500 mg by mouth daily.        Marland Kitchen aspirin 81 MG tablet Take 81 mg by mouth daily.        . Calcium Carbonate-Vitamin D (CALTRATE 600+D) 600-400 MG-UNIT per tablet Take 1 tablet by mouth daily.        . Cholecalciferol (VITAMIN D) 1000 UNITS capsule Take 1,000 Units by mouth daily.        . digoxin (LANOXIN) 0.125 MG tablet Take 125 mcg by mouth daily.        . furosemide (LASIX) 40 MG tablet Take 1 1/2 tablets daily in am  135 tablet  3  . ibandronate (BONIVA) 150 MG tablet TAKE 1 TABLET EVERY 30 DAYS  3 tablet  3  . levothyroxine (SYNTHROID,  LEVOTHROID) 75 MCG tablet Take 75 mcg by mouth daily.        . metoprolol (LOPRESSOR) 50 MG tablet TAKE 1 TABLET TWICE A DAY  180 tablet  49  . Multiple Vitamin (MULTIVITAMIN) capsule Take 1 capsule by mouth daily.        . potassium chloride (KLOR-CON) 20 MEQ packet Take 1 tablet in am and 1/2 in pm while taking lasix  135 tablet  3  . Probiotic Product (PRO-FLORA CONCENTRATE) CAPS Take 1 capsule by mouth daily.        . simvastatin (ZOCOR) 40 MG tablet TAKE 1 TABLET ONCE A DAY  90 tablet  49  . vitamin E 400 UNIT capsule Take 400 Units by mouth daily.        Marland Kitchen warfarin (COUMADIN) 2.5 MG tablet Take 1 tablet (2.5 mg total) by mouth as directed.  14 tablet  0    Allergies  Codeine  Electrocardiogram:  Assessment and Plan

## 2011-05-24 NOTE — Assessment & Plan Note (Signed)
No change in murmur.  Not an operative candidate.  No need for F/U echo at her age

## 2011-05-24 NOTE — Assessment & Plan Note (Signed)
Cholesterol is at goal.  Continue current dose of statin and diet Rx.  No myalgias or side effects.  F/U  LFT's in 6 months. Lab Results  Component Value Date   LDLCALC 84 11/25/2010

## 2011-05-26 ENCOUNTER — Ambulatory Visit: Payer: Medicare Other | Admitting: Cardiovascular Disease

## 2011-05-28 ENCOUNTER — Encounter: Payer: Medicare Other | Admitting: *Deleted

## 2011-06-09 ENCOUNTER — Encounter: Payer: Self-pay | Admitting: Pulmonary Disease

## 2011-06-09 ENCOUNTER — Ambulatory Visit (INDEPENDENT_AMBULATORY_CARE_PROVIDER_SITE_OTHER): Payer: Medicare Other | Admitting: Pulmonary Disease

## 2011-06-09 DIAGNOSIS — I4891 Unspecified atrial fibrillation: Secondary | ICD-10-CM

## 2011-06-09 DIAGNOSIS — I5033 Acute on chronic diastolic (congestive) heart failure: Secondary | ICD-10-CM

## 2011-06-09 DIAGNOSIS — J309 Allergic rhinitis, unspecified: Secondary | ICD-10-CM

## 2011-06-09 DIAGNOSIS — E7800 Pure hypercholesterolemia, unspecified: Secondary | ICD-10-CM

## 2011-06-09 DIAGNOSIS — D649 Anemia, unspecified: Secondary | ICD-10-CM

## 2011-06-09 DIAGNOSIS — M810 Age-related osteoporosis without current pathological fracture: Secondary | ICD-10-CM

## 2011-06-09 DIAGNOSIS — Z23 Encounter for immunization: Secondary | ICD-10-CM

## 2011-06-09 DIAGNOSIS — E039 Hypothyroidism, unspecified: Secondary | ICD-10-CM

## 2011-06-09 DIAGNOSIS — E785 Hyperlipidemia, unspecified: Secondary | ICD-10-CM

## 2011-06-09 DIAGNOSIS — M545 Low back pain, unspecified: Secondary | ICD-10-CM

## 2011-06-09 DIAGNOSIS — I10 Essential (primary) hypertension: Secondary | ICD-10-CM

## 2011-06-09 DIAGNOSIS — I059 Rheumatic mitral valve disease, unspecified: Secondary | ICD-10-CM

## 2011-06-09 NOTE — Patient Instructions (Signed)
Today we updated your med list in EPIC...    Continue your current meds the same...  Add MIRALAX OTC> one capful of the powder mixed in a glass of water or juice daily...  We gave you the 2012 Flu vaccine today...  Call for any problems...  Let's plan a follow up visit in 4-6 months time.Marland KitchenMarland Kitchen

## 2011-06-18 ENCOUNTER — Ambulatory Visit (INDEPENDENT_AMBULATORY_CARE_PROVIDER_SITE_OTHER): Payer: Medicare Other | Admitting: *Deleted

## 2011-06-18 DIAGNOSIS — I4891 Unspecified atrial fibrillation: Secondary | ICD-10-CM

## 2011-06-18 LAB — POCT INR: INR: 2.6

## 2011-07-04 ENCOUNTER — Encounter: Payer: Self-pay | Admitting: Pulmonary Disease

## 2011-07-04 NOTE — Progress Notes (Signed)
Subjective:    Patient ID: Erin Rivers, female    DOB: September 24, 1920, 77 y.o.   MRN: [id number]  HPI 77 y/o WF here for a follow up visit... she has multiple medical problems including:  CHF w/ severe MR and chr AFib, Hyperlipidemia, Hypothyroid, Osteopenia, etc... she is followed for Cards by Walker Kehr.  ~  July 29, 2010:  post hosp check- improved after diuresis > eval revealed severe MR & pulmHTN (NOTE: CXR w/ cardiomeg & basilar changes, Labs w/ Hg=10.9, BNP=1089 > admitted for diuresis & 2DEcho showed severe MR, mild LVH, EF 55-60%, & pulm HTN w/ PAsys=55);  sent home on Dig (level= 1.6 on 0.125mg /d) & Lasix 40mg /d w/ f/u BNP today = 822 & Creat= 0.9, so we will incr her Lasix to 60mg /d... they also incr her Synthroid from 50 to 11mcg/d due to TSH in the 5-7 range... heart rate today= 74/min w/ her current meds... plan f/u 75mo.  ~  August 31, 2010:  we incr the Lasix to 60mg  Qam but weight is up 2# to 118#, no swelling, breathing is good, no CP etc... she had episode epistaxis w/ eval ENT- DrBates, & he cauterized it... may need further incr in Lasix but she is feeling well now & BP= 112/64, therefore continue same meds for now & plan f/u w/ Cards 1/12 & w/ me 3/12...  ~  December 04, 2010:  62mo ROV- she is improved, walking daily & planning trip to [place] at the end of May...    Cards>  She has chr AFib, Diastolic HF, severe MVP/ MR, & pulmHTN; on Metoprolol, Digoxin, Lasix/ KCl, Coumadin, ASA; she saw [facility] 1/12 & he wanted her to discuss advanced directive- she has living will, she is not ready to think about NCB/ hospice care/ etc...  Labs today w/ Chems= normal, BNP= 531; Rec- continue same meds.    Chol>  On Simva40 + low fat diet & FLP today shows TChol 150, TG 118, HDL 43, LDL 84; rec- continue same...    Thyroid>  Hx hypothyroid on Synthroid 63mcg/d;  TSH today= 0.75, tol well w/o palpit etc (continue same)...    Osteopenia>  On Boniva monthly + calcium, MVI, Vit D 100u daily;  Last  BMD w/ TScore -1.8 in Spine, due for f/u but she wants to wait.  ~  March 10, 2011:  62mo ROV & she is doing well, had a wonderful trip to [place] w/o any difficulty reported;  Breathing well & denies cough, sputum, ch in DOE etc;  Denies CP, palpit, syncope, etc;  Stable on her current meds for AFib DD, MR & PulmHTN...    LABS today showed BMet=wnl, CBC=ok, & BNP= 430 on Lasix 40mg - taking 1.5 tabs daily> no changes made today.  ~  June 09, 2011:  62mo ROV & her CC today is contipation & we discussed Miralax, Senakot, etc;  She saw Walker Kehr 9/12- remains stable, no changes meds, she understands that she is not an operative candidate; he again asked that we discuss advanced directive and as noted in March- she has a living will & is not yet ready to discuss NCB, DNR, hospice & the like...  She notes that he breathing is good; denies CP, palpit, ch in DOE/SOB, edema; she has chr AFib w/ rate control & Coumadin; Chol ok on Simva40, & Thyroid ok on Synthroid75...  She requests Flu vaccine today.          Problem List:    HEARING LOSS (  ICD-389.9) - she wears digital hearing aides...  ALLERGIC RHINITIS (ICD-477.9) - uses OTC meds Prn...  Hx of POSITIVE PPD (ICD-795.5) - no known Tb exposure... asymptomatic without cough, sputum, hemoptysis, etc... 11/11 c/o mild dyspnea but more trouble getting air "in"... ~  CXR 11/09 mild cardiomeg, atherosclerotic Ao, clear lungs, mild scoliosis. ~  CXR 11/10 showed similar- cardiomeg, sl incr markings, NAD. ~  CXR 11/11 showed similar cardiomeg but R>L basilar airsp dis vs atx vs effusion (improved w/ diuresis).  MITRAL VALVE PROLAPSE (ICD-424.0) -  MITRAL REGURGITATION (ICD-396.3) -  DIASTOLIC CHF -  ATRIAL FIBRILLATION, CHRONIC (ICD-427.31) - she was followed by Walker Kehr for Cardiology- his notes are reviewed... AFib dx in 2002 treated w/ rate control on METOPROLOL 50mg Bid, ASA 81mg /d and COUMADIN- followed in the coumadin clinic... l2DEcho 6/08 showed  bileaflet MVP w/ mod to severe MR...  ~  5/11:  she remains asymptomatic without CP, palpit, dizziness, syncope, dyspnea, or edema... no symptoms of TIA/ stroke... she remains very active and doing well- walks >10mi per day... ~  11/11:  presents w/ some incr SOB- w/ activ & at rest, can't get a DB, sighs help, min cough, no sputum, no f/c/s, feels "tight" but no wheezing... **NOTE> CXR w/ cardiomeg & basilar edema, Labs w/ Hg=10.9, BNP>1000... Adm for diuresis- 2DEcho showed mild LVH, EF=55-60%, no regional wall motion abn, severe MR, pulmHTN w/ PAsys=55... disch on DIGOXIN 0.125mg /d & LASIX 40mg /d... ~  Post hosp check 11/11:  improved overall, Dig= 1.6, BNP= 822, Creat= 0.9> rec incr Lasix to 60mg /d & K20 to 1.5 tabs/d as well. ~  Labs 3/12 showed BNP= 532 ~  6/12:  Follow up improved, had great trip to [place] w/o complic & stable on Lasix 60mg /d...  HYPERLIPIDEMIA (ICD-272.4) - on SIMVASTATIN 40mg /d... ~  FLP 5/09 showed TChol 181, TG 95, HDL 54, LDL 108... same med, & low fat diet. ~  FLP 5/10 showed TChol 168, TG 86, HDL 57, LDL 94 ~  FLP 5/11 showed TChol 170, TG 94, HDL 57, LDL 94 ~  FLP 3/12 showed TChol 150, Tg 118, HDL 43, LDL 84  HYPOTHYROIDISM (ICD-244.9) - now on SYNTHROID 60mcg/d... clinically euthyroid & asymptomatic... ~  labs 5/09 showed TSH= 3.71... continue same med. ~  labs 5/10 showed TSH= 3.50 ~  labs 5/11 showed TSH= 5.36 ~  labs in hosp 11/11 showed TSH= 7.77 but FreeT3 & FreeT4 were WNL, they incr Synthroid to 45mcg/d anyway & we will watch HR & recheck. ~  Labs 3/12 on Synthroid75 showed TSH= 0.75  LOW BACK PAIN, CHRONIC (ICD-724.2) - she has seen a chiropractor in the past, and used Vicodin, Tramadol... currently doing satis w/ her exercise program and using OTC meds only as needed.  OSTEOPOROSIS (ICD-733.00) - prev on Actonel 35mg /wk, but changed to BONIVA 150mg /mo per new insurance co ([employer]); plus Calcium/ Vits/ etc... ~  BMD 11/07 showed TScores -1.5  sp, and -1.3 hips... sl improved from 2004 on rx. ~  BMD 12/09 showed TScores -1.8 Spine, & -1.0 left FemNeck... continue Boniva.  ANEMIA (ICD-285.9) ~  labs 5/10 showed Hg= 12.5, MCV= 102 ~  labs 5/11 showed Hg= 12.5, MCV= 99 ~  11/11:  presented w/ dyspnea & Hg=10.9 ?etiology, noanemia w/u done in hosp as f/u labs showed Hg=12-13. ~  Labs 3/12 showed Hg= 12.1  SHINGLES, HX OF (ICD-V13.8) - we briefly discussed the shingles vaccine...  Health Maint:  she has never had a colonoscopy (now in her 42's &  on Coumadin); no hx of GI problems & Hg= 13.0 (5/09) & 12.5 (5/10)... she gets the seasonal Flu vaccine yearly.   Past Surgical History  Procedure Date  . Myringotomies     Outpatient Encounter Prescriptions as of 06/09/2011  Medication Sig Dispense Refill  . Ascorbic Acid (VITAMIN C) 500 MG tablet Take 500 mg by mouth daily.        Marland Kitchen aspirin 81 MG tablet Take 81 mg by mouth daily.        . Calcium Carbonate-Vitamin D (CALTRATE 600+D) 600-400 MG-UNIT per tablet Take 1 tablet by mouth daily.        . Cholecalciferol (VITAMIN D) 1000 UNITS capsule Take 1,000 Units by mouth daily.        . digoxin (LANOXIN) 0.125 MG tablet Take 125 mcg by mouth daily.        . furosemide (LASIX) 40 MG tablet Take 1 1/2 tablets daily in am  135 tablet  3  . ibandronate (BONIVA) 150 MG tablet TAKE 1 TABLET EVERY 30 DAYS  3 tablet  3  . levothyroxine (SYNTHROID, LEVOTHROID) 75 MCG tablet Take 75 mcg by mouth daily.        . metoprolol (LOPRESSOR) 50 MG tablet TAKE 1 TABLET TWICE A DAY  180 tablet  49  . Multiple Vitamin (MULTIVITAMIN) capsule Take 1 capsule by mouth daily.        . polyethylene glycol (MIRALAX / GLYCOLAX) packet Take 17 g by mouth daily.        . potassium chloride (KLOR-CON) 20 MEQ packet Take 1 tablet in am and 1/2 in pm while taking lasix  135 tablet  3  . Probiotic Product (PRO-FLORA CONCENTRATE) CAPS Take 1 capsule by mouth daily.        . simvastatin (ZOCOR) 40 MG tablet TAKE 1 TABLET  ONCE A DAY  90 tablet  49  . vitamin E 400 UNIT capsule Take 400 Units by mouth daily.        Marland Kitchen warfarin (COUMADIN) 2.5 MG tablet Take 1 tablet (2.5 mg total) by mouth as directed.  14 tablet  0    Allergies  Allergen Reactions  . Codeine     REACTION: Nausea    Review of Systems        See HPI - all other systems neg except as noted...  The patient complains of dyspnea on exertion.  The patient denies anorexia, fever, weight loss, weight gain, vision loss, decreased hearing, hoarseness, chest pain, syncope, peripheral edema, prolonged cough, headaches, hemoptysis, abdominal pain, melena, hematochezia, severe indigestion/heartburn, hematuria, incontinence, muscle weakness, suspicious skin lesions, transient blindness, difficulty walking, depression, unusual weight change, abnormal bleeding, enlarged lymph nodes, and angioedema.     Objective:   Physical Exam     WD, WN, 77 y/o WF in NAD... GENERAL:  Alert & oriented; pleasant & cooperative... HEENT:  [place]/AT, EOM-wnl, PERRLA, EACs-clear, TMs-wnl, NOSE-clear, THROAT-clear & wnl. NECK:  Supple w/ fairROM; no JVD; normal carotid impulses w/o bruits; no thyromegaly or nodules palpated; no lymphadenopathy. CHEST:  Decr BS bilat w/ few basilar rales & scat rhonchi, not coughing, no wheezing, no consolidation. HEART:  irreg AFib w/ control VR; gr 3/6 sys murmur at apex, no rubs or gallops... ABDOMEN:  Soft & nontender; normal bowel sounds; no organomegaly or masses detected. EXT: without deformities, mild arthritic changes; no varicose veins/ venous insuffic/ or edema. NEURO:  CN's intact; motor testing normal; sensory testing normal; gait normal & balance OK. DERM:  No lesions noted; no rash etc...   Assessment & Plan:   DYSPNEA, multifactorial>  This is improved, diuresis has helped, rec to continue exercise...  CHF, Diastolic dysfunction>  Continue Lasix 40mg - taking 1.5 tabs daily (same for KCl)...  AFib>  Followed by Walker Kehr, on  coumadin in the CC, continue same meds & incr exercise...  HYPERLIPID>  Stable on Simva40 + diet etc...  HYPOTHYROID>  She remains stable on the Synthroid 96mcg/d...  DJD, LBP, Osteopenia>  Stable on her exerc program + Boniva etc...  Anemia>  Hg is stable ~12 range, continue vit supplements & watch for any bleeding.Marland KitchenMarland Kitchen

## 2011-07-16 ENCOUNTER — Ambulatory Visit (INDEPENDENT_AMBULATORY_CARE_PROVIDER_SITE_OTHER): Payer: Medicare Other | Admitting: *Deleted

## 2011-07-16 DIAGNOSIS — I4891 Unspecified atrial fibrillation: Secondary | ICD-10-CM

## 2011-07-16 DIAGNOSIS — Z7901 Long term (current) use of anticoagulants: Secondary | ICD-10-CM

## 2011-07-16 LAB — POCT INR: INR: 2.1

## 2011-08-27 ENCOUNTER — Ambulatory Visit (INDEPENDENT_AMBULATORY_CARE_PROVIDER_SITE_OTHER): Payer: Medicare Other | Admitting: *Deleted

## 2011-08-27 DIAGNOSIS — I4891 Unspecified atrial fibrillation: Secondary | ICD-10-CM

## 2011-08-27 DIAGNOSIS — Z7901 Long term (current) use of anticoagulants: Secondary | ICD-10-CM

## 2011-09-29 ENCOUNTER — Other Ambulatory Visit: Payer: Self-pay | Admitting: Pulmonary Disease

## 2011-10-07 IMAGING — CR DG CHEST 1V PORT
1 series · 1 of 1 positions shown · non-contrast
Comparison: 07/16/2010

CLINICAL DATA: Shortness of breath, palpitations

Bibasilar atelectasis with hypoaeration.
PORTABLE CHEST - 1 VIEW

[view not recorded]
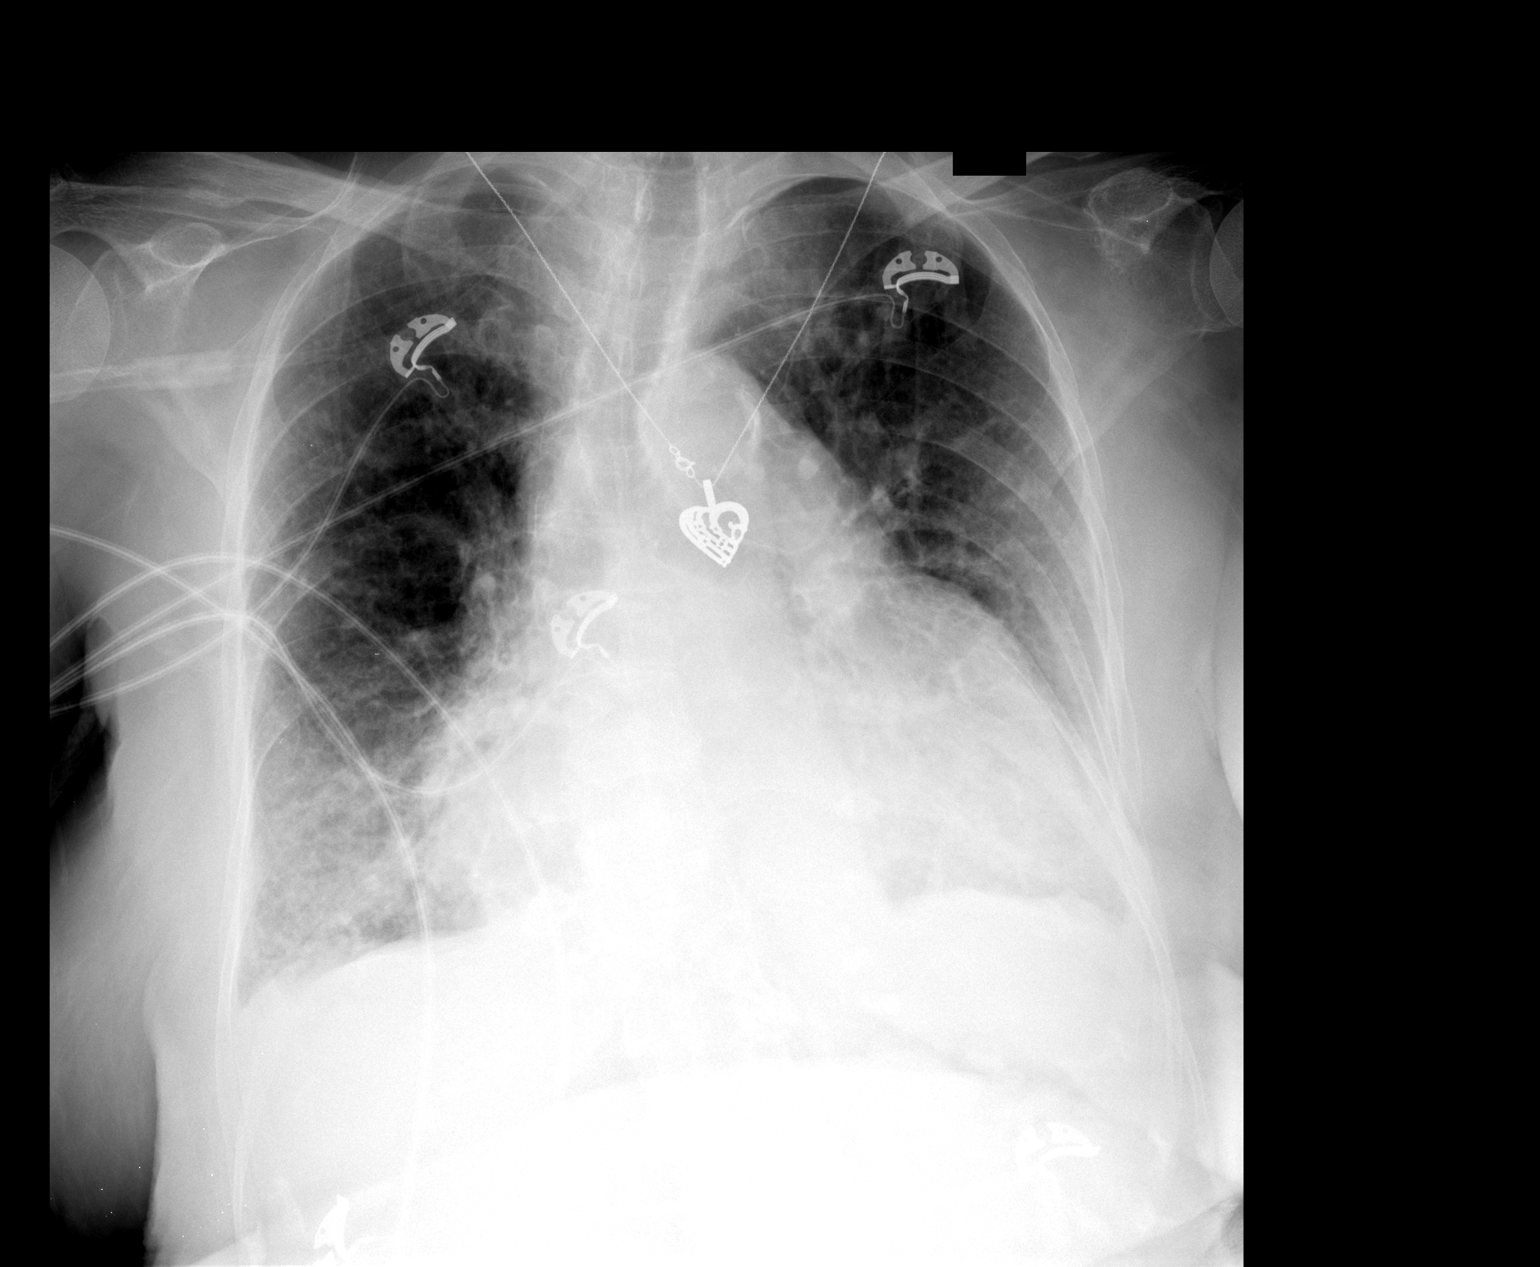

[1 of 1 positions shown; findings below may reference images not displayed]

FINDINGS: The patient's jewelry and cardiac leads obscure detail.
Marked enlargement of the cardiomediastinal silhouette again noted.
Central vascular congestion is present with minimal if any stable
interstitial edema and trace effusions.  Superimposed bibasilar
airspace disease could be due to atelectasis, related to lower lung
volumes on today's exam.
IMPRESSION: Cardiomegaly with mild apparent interstitial pulmonary edema.

## 2011-10-08 ENCOUNTER — Encounter: Payer: Medicare Other | Admitting: *Deleted

## 2011-10-09 IMAGING — CR DG CHEST 2V
2 series · 2 of 2 positions shown · non-contrast
Comparison: 07/19/2010

CLINICAL DATA: Shortness of breath

CHEST - 2 VIEW

[w chest pa]
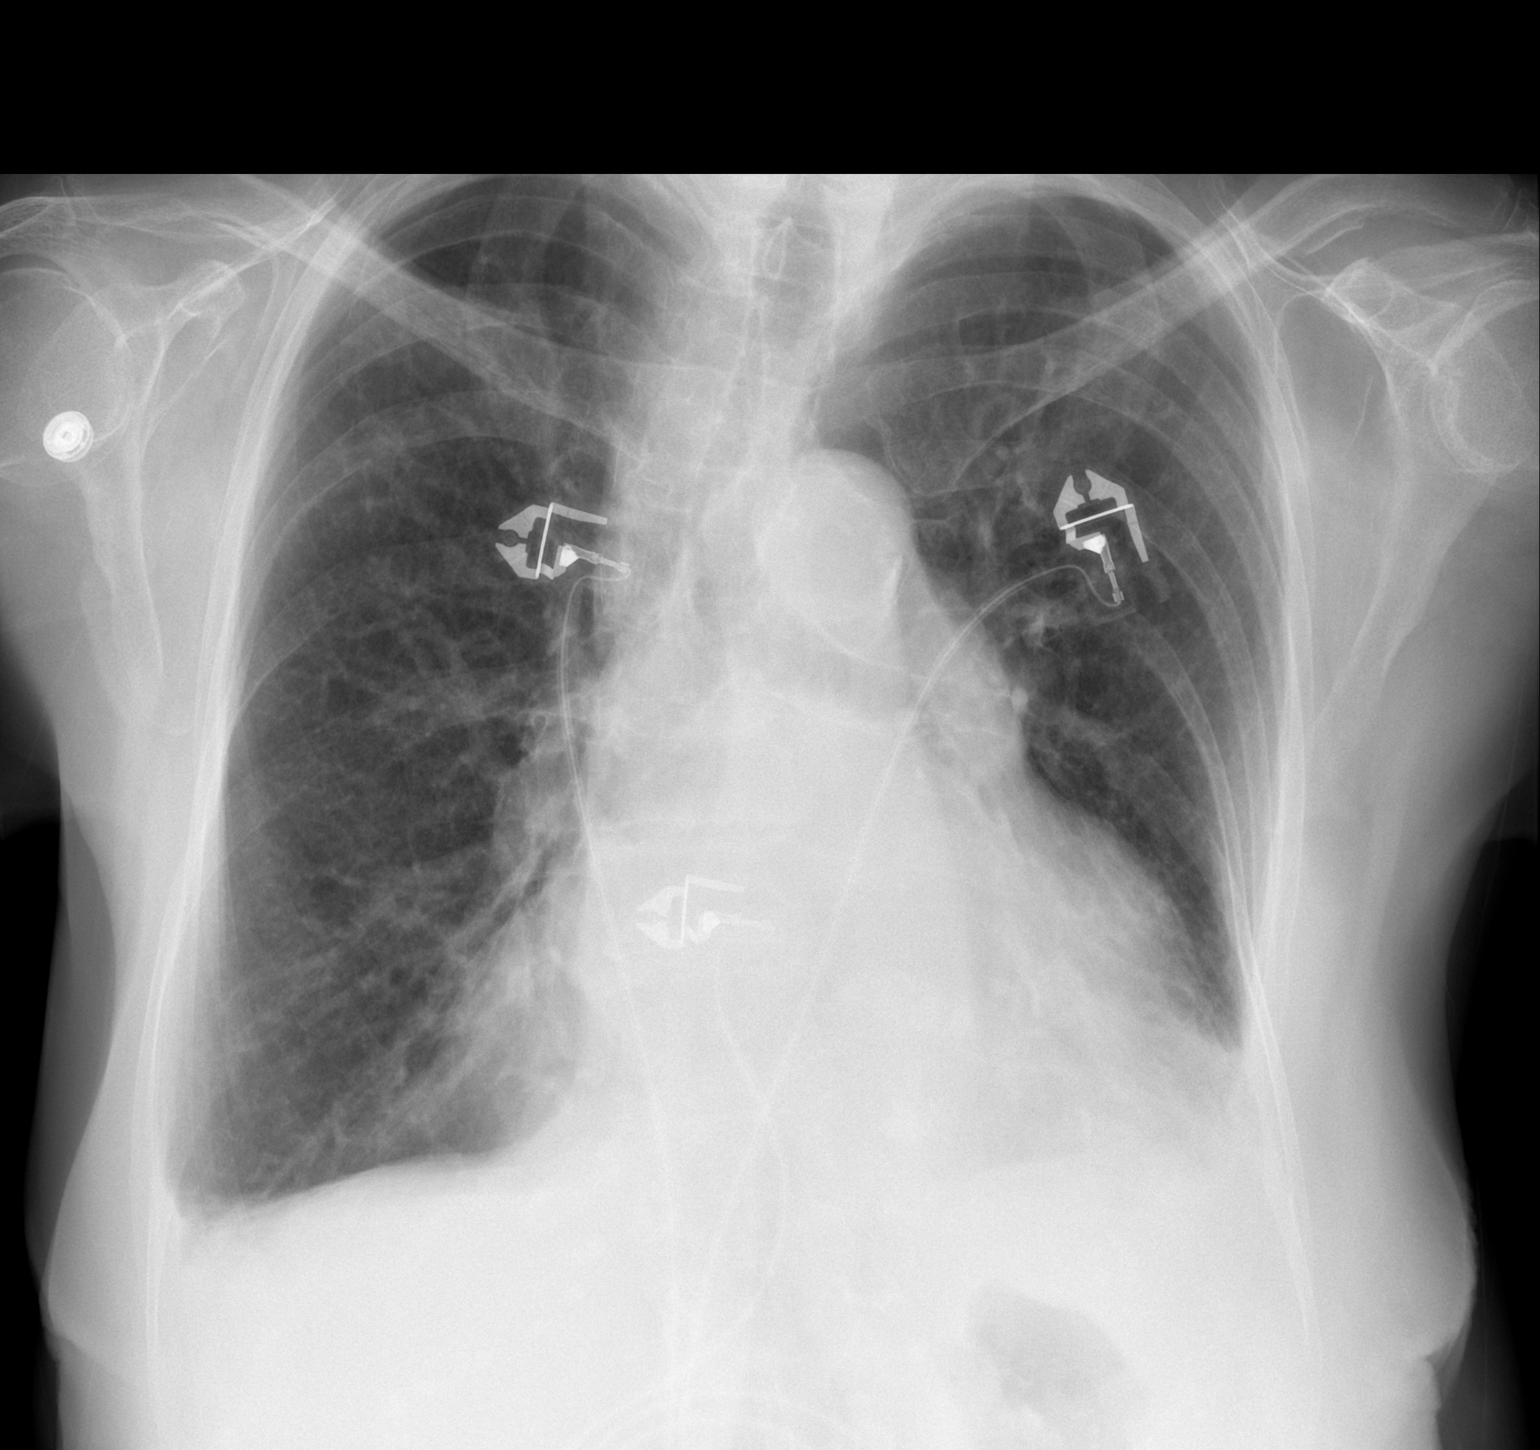

[w chest lat]
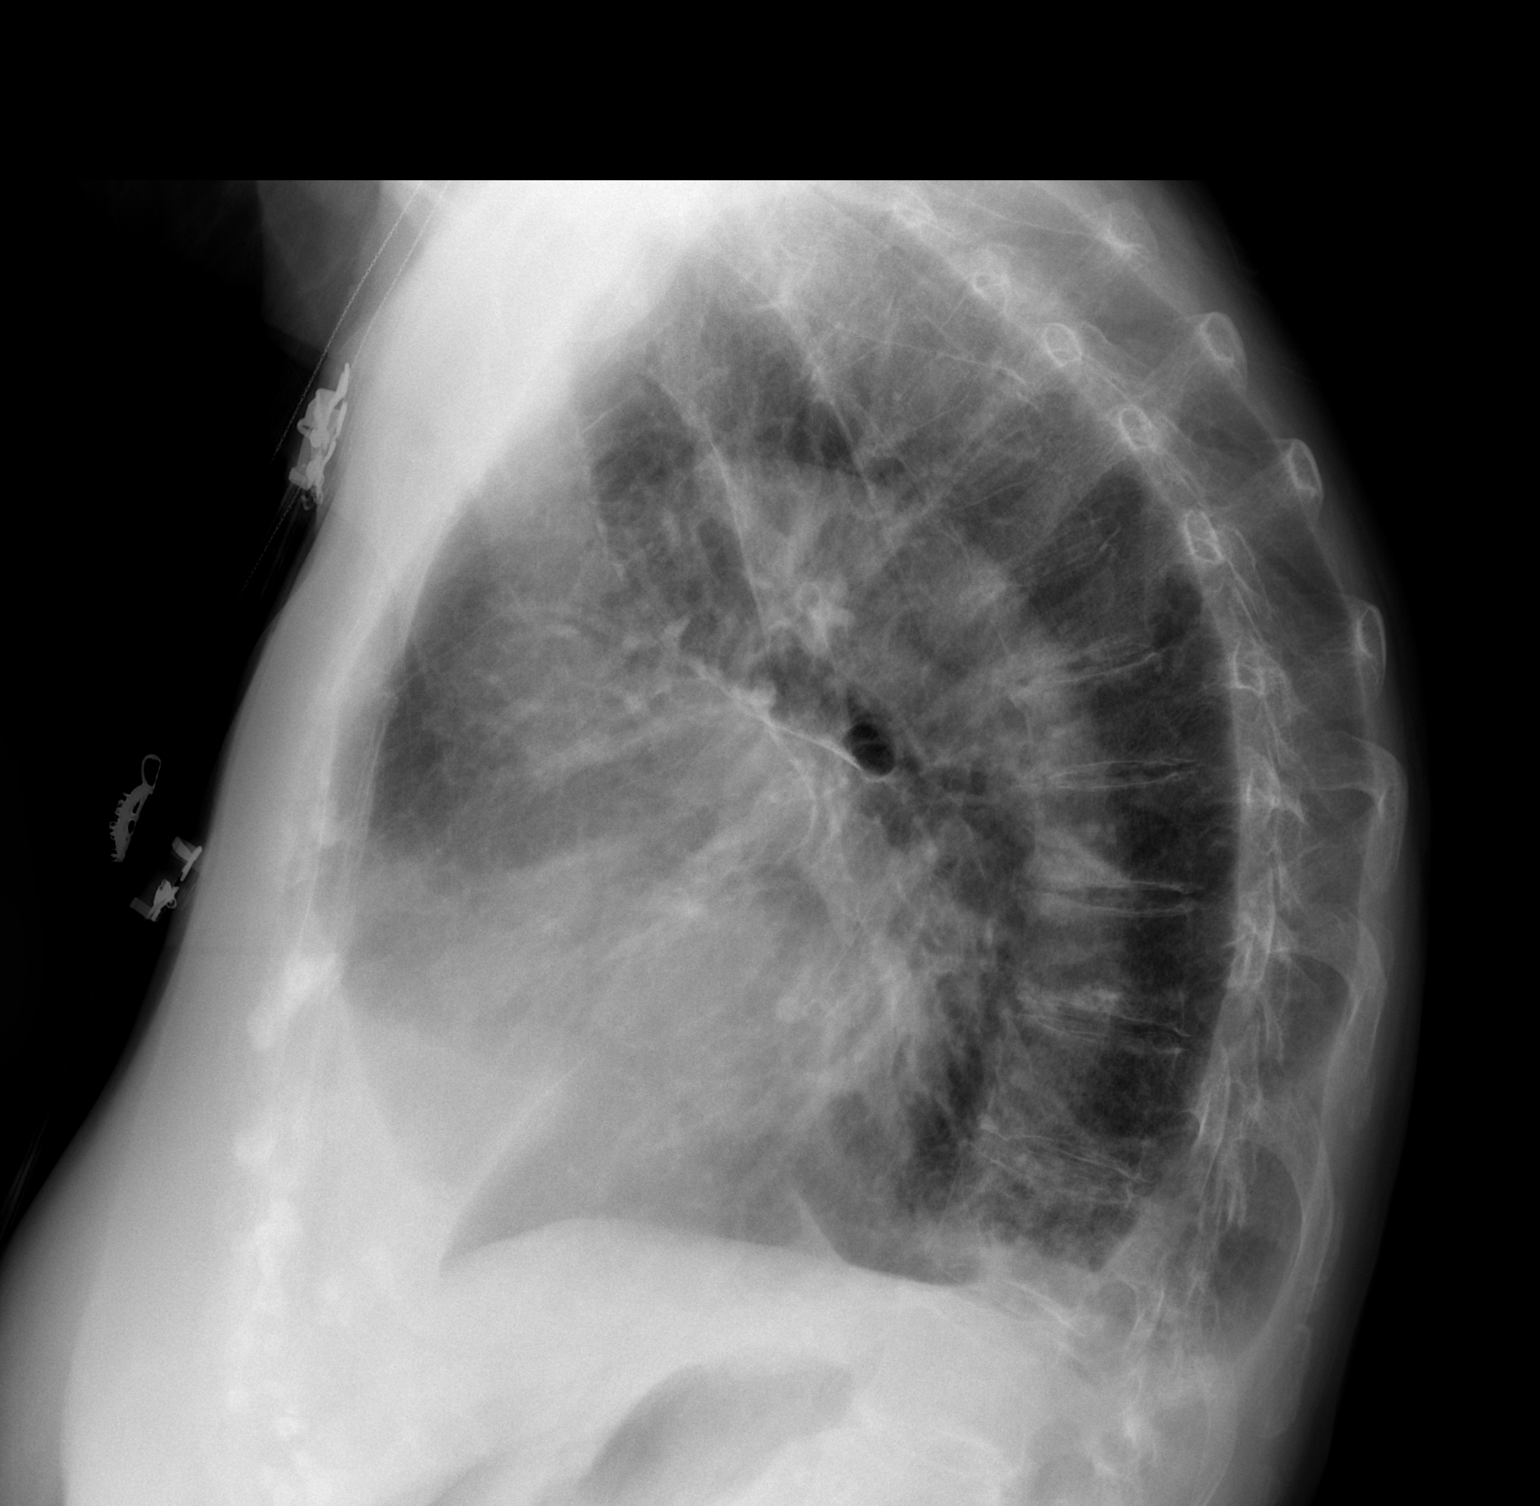

[2 of 2 positions shown; findings below may reference images not displayed]

FINDINGS: Aeration is improved, with apparent resolution of
previously seen interstitial edema.  Bilateral trace pleural
effusions and associated compressive atelectasis persist.
Flattening of hemidiaphragms may suggest COPD.  Moderate
enlargement cardiomediastinal silhouette is stable.  Mild central
endplate depression of a few lower thoracic vertebral bodies noted.
IMPRESSION: Improvement in apparent interstitial edema with changes of COPD and
residual trace effusions.

## 2011-10-13 ENCOUNTER — Ambulatory Visit (INDEPENDENT_AMBULATORY_CARE_PROVIDER_SITE_OTHER): Payer: Medicare Other | Admitting: Pulmonary Disease

## 2011-10-13 ENCOUNTER — Encounter: Payer: Self-pay | Admitting: Pulmonary Disease

## 2011-10-13 ENCOUNTER — Other Ambulatory Visit (INDEPENDENT_AMBULATORY_CARE_PROVIDER_SITE_OTHER): Payer: Medicare Other

## 2011-10-13 DIAGNOSIS — R0600 Dyspnea, unspecified: Secondary | ICD-10-CM

## 2011-10-13 DIAGNOSIS — E7800 Pure hypercholesterolemia, unspecified: Secondary | ICD-10-CM

## 2011-10-13 DIAGNOSIS — E039 Hypothyroidism, unspecified: Secondary | ICD-10-CM

## 2011-10-13 DIAGNOSIS — M810 Age-related osteoporosis without current pathological fracture: Secondary | ICD-10-CM

## 2011-10-13 DIAGNOSIS — D649 Anemia, unspecified: Secondary | ICD-10-CM

## 2011-10-13 DIAGNOSIS — M545 Low back pain, unspecified: Secondary | ICD-10-CM

## 2011-10-13 DIAGNOSIS — I080 Rheumatic disorders of both mitral and aortic valves: Secondary | ICD-10-CM

## 2011-10-13 DIAGNOSIS — R0609 Other forms of dyspnea: Secondary | ICD-10-CM

## 2011-10-13 DIAGNOSIS — I509 Heart failure, unspecified: Secondary | ICD-10-CM

## 2011-10-13 DIAGNOSIS — I059 Rheumatic mitral valve disease, unspecified: Secondary | ICD-10-CM

## 2011-10-13 LAB — CBC WITH DIFFERENTIAL/PLATELET
Basophils Relative: 0.8 % (ref 0.0–3.0)
Eosinophils Absolute: 0.1 10*3/uL (ref 0.0–0.7)
Eosinophils Relative: 1.8 % (ref 0.0–5.0)
Lymphocytes Relative: 25 % (ref 12.0–46.0)
MCV: 94.3 fl (ref 78.0–100.0)
Monocytes Absolute: 0.9 10*3/uL (ref 0.1–1.0)
Neutrophils Relative %: 59 % (ref 43.0–77.0)
Platelets: 196 10*3/uL (ref 150.0–400.0)
RBC: 3.73 Mil/uL — ABNORMAL LOW (ref 3.87–5.11)
WBC: 6.7 10*3/uL (ref 4.5–10.5)

## 2011-10-13 LAB — TSH: TSH: 2.05 u[IU]/mL (ref 0.35–5.50)

## 2011-10-13 LAB — BRAIN NATRIURETIC PEPTIDE: Pro B Natriuretic peptide (BNP): 636 pg/mL — ABNORMAL HIGH (ref 0.0–100.0)

## 2011-10-13 LAB — BASIC METABOLIC PANEL
BUN: 18 mg/dL (ref 6–23)
CO2: 31 mEq/L (ref 19–32)
Chloride: 100 mEq/L (ref 96–112)
Creatinine, Ser: 0.9 mg/dL (ref 0.4–1.2)
Glucose, Bld: 93 mg/dL (ref 70–99)
Potassium: 5.5 mEq/L — ABNORMAL HIGH (ref 3.5–5.1)

## 2011-10-13 NOTE — Progress Notes (Signed)
Subjective:    Patient ID: Erin Rivers, female    DOB: 06/01/1921, 78 y.o.   MRN: [id number]  HPI 78 y/o WF here for a follow up visit... she has multiple medical problems including:  CHF w/ severe MR and chr AFib, Hyperlipidemia, Hypothyroid, Osteopenia, etc... she is followed for Cards by Walker Kehr.  ~  December 04, 2010:  16mo ROV- she is improved, walking daily & planning trip to [place] at the end of May...    Cards>  She has chr AFib, Diastolic HF, severe MVP/ MR, & pulmHTN; on Metoprolol, Digoxin, Lasix/ KCl, Coumadin, ASA; she saw [facility] 1/12 & he wanted her to discuss advanced directive- she has living will, she is not ready to think about NCB/ hospice care/ etc...  Labs today w/ Chems= normal, BNP= 531; Rec- continue same meds.    Chol>  On Simva40 + low fat diet & FLP today shows TChol 150, TG 118, HDL 43, LDL 84; rec- continue same...    Thyroid>  Hx hypothyroid on Synthroid 41mcg/d;  TSH today= 0.75, tol well w/o palpit etc (continue same)...    Osteopenia>  On Boniva monthly + calcium, MVI, Vit D 100u daily;  Last BMD w/ TScore -1.8 in Spine, due for f/u but she wants to wait.  ~  March 10, 2011:  16mo ROV & she is doing well, had a wonderful trip to [place] w/o any difficulty reported;  Breathing well & denies cough, sputum, ch in DOE etc;  Denies CP, palpit, syncope, etc;  Stable on her current meds for AFib DD, MR & PulmHTN...    LABS today showed BMet=wnl, CBC=ok, & BNP= 430 on Lasix 40mg - taking 1.5 tabs daily> no changes made today.  ~  June 09, 2011:  16mo ROV & her CC today is contipation & we discussed Miralax, Senakot, etc;  She saw Walker Kehr 9/12- remains stable, no change in meds, she understands that she is not an operative candidate; he again asked that we discuss advanced directive and as noted in March- she has a living will & is not yet ready to discuss NCB, DNR, hospice & the like...  She notes that her breathing is good; denies CP, palpit, ch in DOE/SOB, edema; she  has chr AFib w/ rate control & Coumadin; Chol ok on Simva40, & Thyroid ok on Synthroid75...  She requests Flu vaccine today.  ~  October 13, 2011:  21mo ROV & she is generally stable w/ her chr AFib,, diastolic CHF, severe MR, & PulmHTN- on Lasix 40mg - taking 1.5 tabs daily along w/ K20- 1.5 daily as well (labs showed BNP= 636 & K=5.5 & we decr the K supplement to just one Qam);  She had a recent trip to [place] [employer] & did satis- she denies SOB (+DOE w/o change), no CP/ palpit, no edema;  Continue same & coumadin clinic... See prob list below>>          Problem List:    HEARING LOSS (ICD-389.9) - she wears digital hearing aides...  ALLERGIC RHINITIS (ICD-477.9) - uses OTC meds Prn...  Hx of POSITIVE PPD (ICD-795.5) - no known Tb exposure... asymptomatic without cough, sputum, hemoptysis, etc... 11/11 c/o mild dyspnea but more trouble getting air "in"... ~  CXR 11/09 mild cardiomeg, atherosclerotic Ao, clear lungs, mild scoliosis. ~  CXR 11/10 showed similar- cardiomeg, sl incr markings, NAD. ~  CXR 11/11 showed similar cardiomeg but R>L basilar airsp dis vs atx vs effusion (improved w/ diuresis).  MITRAL VALVE PROLAPSE (ICD-424.0) -  MITRAL REGURGITATION (ICD-396.3) -  DIASTOLIC CHF -  ATRIAL FIBRILLATION, CHRONIC (ICD-427.31) - she was followed by Walker Kehr for Cardiology- his notes are reviewed... AFib dx in 2002 treated w/ rate control on METOPROLOL 50mg Bid, ASA 81mg /d and COUMADIN- followed in the coumadin clinic... l2DEcho 6/08 showed bileaflet MVP w/ mod to severe MR...  ~  5/11:  she remains asymptomatic without CP, palpit, dizziness, syncope, dyspnea, or edema... no symptoms of TIA/ stroke... she remains very active and doing well- walks >81mi per day... ~  11/11:  presents w/ incr SOB- w/ activ & at rest, can't get a DB, sighs help, min cough, no sputum, no f/c/s, feels "tight" but no wheezing... **NOTE> CXR w/ cardiomeg & basilar edema, Labs w/ Hg=10.9, BNP>1000... Adm for diuresis- 2DEcho  showed mild LVH, EF=55-60%, no regional wall motion abn, severe MR, pulmHTN w/ PAsys=55... disch on DIGOXIN 0.125mg /d & LASIX 40mg /d... ~  Post hosp check 11/11:  improved overall, Dig= 1.6, BNP= 822, Creat= 0.9> rec incr Lasix to 60mg /d & K20 to 1.5 tabs/d as well. ~  Labs 3/12 showed BNP= 532 ~  6/12:  Follow up improved, had great trip to [place] w/o complic & stable on Lasix 60mg /d w/ BNP= 430... ~  9/12:  DrNishan reiterated that she is not an operative candidate (severe MR) but she is compensated & not yet ready to talk about NCB, DNR, Hospice, etc... ~  1/13:  She reports trip to [facility] & did satis; stable overall & BNP= 636 on her Lasix 60mg /d...  HYPERLIPIDEMIA (ICD-272.4) - on SIMVASTATIN 40mg /d... ~  FLP 5/09 showed TChol 181, TG 95, HDL 54, LDL 108... same med, & low fat diet. ~  FLP 5/10 showed TChol 168, TG 86, HDL 57, LDL 94 ~  FLP 5/11 showed TChol 170, TG 94, HDL 57, LDL 94 ~  FLP 3/12 showed TChol 150, Tg 118, HDL 43, LDL 84  HYPOTHYROIDISM (ICD-244.9) - now on SYNTHROID 10mcg/d... clinically euthyroid & asymptomatic... ~  labs 5/09 showed TSH= 3.71... continue same med. ~  labs 5/10 showed TSH= 3.50 ~  labs 5/11 showed TSH= 5.36 ~  labs in hosp 11/11 showed TSH= 7.77 but FreeT3 & FreeT4 were WNL, they incr Synthroid to 63mcg/d anyway & we will watch HR & recheck. ~  Labs 3/12 on Synthroid75 showed TSH= 0.75 ~  Labs 1/13 showed TSH= 2.05  LOW BACK PAIN, CHRONIC (ICD-724.2) - she has seen a chiropractor in the past, and used Vicodin, Tramadol... currently doing satis w/ her exercise program and using OTC meds only as needed.  OSTEOPOROSIS (ICD-733.00) - prev on Actonel 35mg /wk, but changed to BONIVA 150mg /mo per new insurance co ([employer]); plus Calcium/ Vits/ etc... ~  BMD 11/07 showed TScores -1.5 sp, and -1.3 hips... sl improved from 2004 on rx. ~  BMD 12/09 showed TScores -1.8 Spine, & -1.0 left FemNeck... continue Boniva. ~  Labs 1/13 showed Vit D level = 58...  rec to continue VitD 1000u daily.  ANEMIA (ICD-285.9) ~  labs 5/10 showed Hg= 12.5, MCV= 102 ~  labs 5/11 showed Hg= 12.5, MCV= 99 ~  11/11:  presented w/ dyspnea & Hg=10.9 ?etiology, noanemia w/u done in hosp as f/u labs showed Hg=12-13. ~  Labs 3/12 showed Hg= 12.1 ~  Labs 1/13 showed Hg= 11.6  SHINGLES, HX OF (ICD-V13.8) - we briefly discussed the shingles vaccine...  Health Maint:  she has never had a colonoscopy (now in her 65's & on Coumadin); no hx of GI problems &  Hg= 13.0 (5/09) & 12.5 (5/10)... she gets the seasonal Flu vaccine yearly.   Past Surgical History  Procedure Date  . Myringotomies     Outpatient Encounter Prescriptions as of 10/13/2011  Medication Sig Dispense Refill  . Ascorbic Acid (VITAMIN C) 500 MG tablet Take 500 mg by mouth daily.        Marland Kitchen aspirin 81 MG tablet Take 81 mg by mouth daily.        . Calcium Carbonate-Vitamin D (CALTRATE 600+D) 600-400 MG-UNIT per tablet Take 1 tablet by mouth daily.        . Cholecalciferol (VITAMIN D) 1000 UNITS capsule Take 1,000 Units by mouth daily.        . digoxin (LANOXIN) 0.125 MG tablet TAKE 1 TABLET ONCE DAILY  90 tablet  3  . furosemide (LASIX) 40 MG tablet Take 1 1/2 tablets daily in am  135 tablet  3  . ibandronate (BONIVA) 150 MG tablet TAKE 1 TABLET EVERY 30 DAYS  3 tablet  3  . levothyroxine (SYNTHROID, LEVOTHROID) 75 MCG tablet TAKE 1 TABLET ONCE DAILY  90 tablet  3  . metoprolol (LOPRESSOR) 50 MG tablet TAKE 1 TABLET TWICE A DAY  180 tablet  49  . Multiple Vitamin (MULTIVITAMIN) capsule Take 1 capsule by mouth daily.        . potassium chloride (KLOR-CON) 20 MEQ packet Take 1 tablet in am and 1/2 in pm while taking lasix  135 tablet  3  . Probiotic Product (PRO-FLORA CONCENTRATE) CAPS Take 1 capsule by mouth daily.        . simvastatin (ZOCOR) 40 MG tablet TAKE 1 TABLET ONCE A DAY  90 tablet  49  . vitamin E 400 UNIT capsule Take 400 Units by mouth daily.        Marland Kitchen warfarin (COUMADIN) 2.5 MG tablet Take 1  tablet (2.5 mg total) by mouth as directed.  14 tablet  0  . DISCONTD: polyethylene glycol (MIRALAX / GLYCOLAX) packet Take 17 g by mouth daily.          Allergies  Allergen Reactions  . Codeine     REACTION: Nausea    Review of Systems        See HPI - all other systems neg except as noted...  The patient complains of dyspnea on exertion.  The patient denies anorexia, fever, weight loss, weight gain, vision loss, decreased hearing, hoarseness, chest pain, syncope, peripheral edema, prolonged cough, headaches, hemoptysis, abdominal pain, melena, hematochezia, severe indigestion/heartburn, hematuria, incontinence, muscle weakness, suspicious skin lesions, transient blindness, difficulty walking, depression, unusual weight change, abnormal bleeding, enlarged lymph nodes, and angioedema.     Objective:   Physical Exam     WD, WN, 78 y/o WF in NAD... GENERAL:  Alert & oriented; pleasant & cooperative... HEENT:  [place]/AT, EOM-wnl, PERRLA, EACs-clear, TMs-wnl, NOSE-clear, THROAT-clear & wnl. NECK:  Supple w/ fairROM; no JVD; normal carotid impulses w/o bruits; no thyromegaly or nodules palpated; no lymphadenopathy. CHEST:  Decr BS bilat w/ few basilar rales & scat rhonchi, not coughing, no wheezing, no consolidation. HEART:  irreg AFib w/ control VR; gr 3/6 sys murmur at apex, no rubs or gallops... ABDOMEN:  Soft & nontender; normal bowel sounds; no organomegaly or masses detected. EXT: without deformities, mild arthritic changes; no varicose veins/ venous insuffic/ or edema. NEURO:  CN's intact; motor testing normal; sensory testing normal; gait normal & balance OK. DERM:  No lesions noted; no rash etc...  RADIOLOGY DATA:  Reviewed  in the [facility] EMR & discussed w/ the patient...    >>CXR 11/11 showed cardiomeg, improved aeration w/ resolution of interstitial edema, trace pleural effusions & atx...  LABORATORY DATA:  Reviewed in the EPIC EMR & discussed w/ the patient...    Assessment &  Plan:   DYSPNEA, multifactorial>  This is improved, diuresis has helped, rec to continue exercise...  CHF, Diastolic dysfunction>  Continue Lasix 40mg - taking 1.5 tabs daily (& KCl- one daily)...  AFib>  Followed by Walker Kehr, on coumadin in the CC, continue same meds & incr exercise...  HYPERLIPID>  Stable on Simva40 + diet etc...  HYPOTHYROID>  She remains stable on the Synthroid 53mcg/d...  DJD, LBP, Osteopenia>  Stable on her exerc program + Boniva etc...  Anemia>  Hg is stable ~11-12 range, continue vit supplements & watch for any bleeding...   Patient's Medications  New Prescriptions   No medications on file  Previous Medications   ASCORBIC ACID (VITAMIN C) 500 MG TABLET    Take 500 mg by mouth daily.     ASPIRIN 81 MG TABLET    Take 81 mg by mouth daily.     CALCIUM CARBONATE-VITAMIN D (CALTRATE 600+D) 600-400 MG-UNIT PER TABLET    Take 1 tablet by mouth daily.     CHOLECALCIFEROL (VITAMIN D) 1000 UNITS CAPSULE    Take 1,000 Units by mouth daily.     DIGOXIN (LANOXIN) 0.125 MG TABLET    TAKE 1 TABLET ONCE DAILY   FUROSEMIDE (LASIX) 40 MG TABLET    Take 1 1/2 tablets daily in am   IBANDRONATE (BONIVA) 150 MG TABLET    TAKE 1 TABLET EVERY 30 DAYS   LEVOTHYROXINE (SYNTHROID, LEVOTHROID) 75 MCG TABLET    TAKE 1 TABLET ONCE DAILY   METOPROLOL (LOPRESSOR) 50 MG TABLET    TAKE 1 TABLET TWICE A DAY   MULTIPLE VITAMIN (MULTIVITAMIN) CAPSULE    Take 1 capsule by mouth daily.     PROBIOTIC PRODUCT (PRO-FLORA CONCENTRATE) CAPS    Take 1 capsule by mouth daily.     SIMVASTATIN (ZOCOR) 40 MG TABLET    TAKE 1 TABLET ONCE A DAY   VITAMIN E 400 UNIT CAPSULE    Take 400 Units by mouth daily.     WARFARIN (COUMADIN) 2.5 MG TABLET    Take 1 tablet (2.5 mg total) by mouth as directed.  Modified Medications   Modified Medication Previous Medication   POTASSIUM CHLORIDE (KLOR-CON) 20 MEQ PACKET potassium chloride (KLOR-CON) 20 MEQ packet      Take 1 tablet in am    Take 1 tablet in am and 1/2 in  pm while taking lasix  Discontinued Medications   POLYETHYLENE GLYCOL (MIRALAX / GLYCOLAX) PACKET    Take 17 g by mouth daily.

## 2011-10-13 NOTE — Patient Instructions (Signed)
Today we updated your med list in our EPIC system...    Continue your current medications the same...    OK to take Tylenol as needed (even the Arthritis strength)...  For your Constipation:    Try the OTC MIRALAX 1/2 to 1 capful in water daily...  Today we did your follow up blood work...    Please call the PHONE TREE in a few days for your results...    Dial [id number] & when prompted enter your patient number followed by the # symbol...    Your patient number is:  [id number]  Call for any questions...  Let's plan a follow up appt in 4-6 months w/ Fasting blood woek at that time.Marland KitchenMarland Kitchen

## 2011-10-14 LAB — VITAMIN D 25 HYDROXY (VIT D DEFICIENCY, FRACTURES): Vit D, 25-Hydroxy: 58 ng/mL (ref 30–89)

## 2011-10-14 LAB — HEPATIC FUNCTION PANEL
ALT: 18 U/L (ref 0–35)
AST: 28 U/L (ref 0–37)
Total Protein: 7.2 g/dL (ref 6.0–8.3)

## 2011-10-15 ENCOUNTER — Ambulatory Visit (INDEPENDENT_AMBULATORY_CARE_PROVIDER_SITE_OTHER): Payer: Medicare Other | Admitting: Pharmacist

## 2011-10-15 DIAGNOSIS — Z7901 Long term (current) use of anticoagulants: Secondary | ICD-10-CM

## 2011-10-15 DIAGNOSIS — I4891 Unspecified atrial fibrillation: Secondary | ICD-10-CM

## 2011-10-18 ENCOUNTER — Other Ambulatory Visit: Payer: Self-pay | Admitting: Pulmonary Disease

## 2011-10-18 MED ORDER — POTASSIUM CHLORIDE 20 MEQ PO PACK: PACK | ORAL | Status: DC

## 2011-10-19 ENCOUNTER — Telehealth: Payer: Self-pay | Admitting: Pulmonary Disease

## 2011-10-19 NOTE — Telephone Encounter (Signed)
I spoke with spouse and advised him the corect rx for potassium was sent in yesterday. Spouse voiced his understanding and needed nothing further

## 2011-11-09 ENCOUNTER — Encounter: Payer: Self-pay | Admitting: Pulmonary Disease

## 2011-11-22 ENCOUNTER — Ambulatory Visit: Payer: Medicare Other | Admitting: Cardiovascular Disease

## 2011-11-26 ENCOUNTER — Ambulatory Visit (INDEPENDENT_AMBULATORY_CARE_PROVIDER_SITE_OTHER): Payer: Medicare Other | Admitting: *Deleted

## 2011-11-26 DIAGNOSIS — I4891 Unspecified atrial fibrillation: Secondary | ICD-10-CM

## 2011-11-26 DIAGNOSIS — Z7901 Long term (current) use of anticoagulants: Secondary | ICD-10-CM

## 2011-11-26 LAB — POCT INR: INR: 2.1

## 2011-12-06 ENCOUNTER — Ambulatory Visit (INDEPENDENT_AMBULATORY_CARE_PROVIDER_SITE_OTHER): Payer: Medicare Other | Admitting: Cardiovascular Disease

## 2011-12-06 ENCOUNTER — Encounter: Payer: Self-pay | Admitting: Cardiovascular Disease

## 2011-12-06 VITALS — BP 122/80 | HR 71 | Wt 108.8 lb

## 2011-12-06 DIAGNOSIS — I509 Heart failure, unspecified: Secondary | ICD-10-CM

## 2011-12-06 DIAGNOSIS — I4891 Unspecified atrial fibrillation: Secondary | ICD-10-CM

## 2011-12-06 DIAGNOSIS — I080 Rheumatic disorders of both mitral and aortic valves: Secondary | ICD-10-CM

## 2011-12-06 NOTE — Assessment & Plan Note (Signed)
Euvolemic with good activity level Continue current medical RX

## 2011-12-06 NOTE — Assessment & Plan Note (Signed)
No change in murmur. Not a surgical candidate due to advanced age

## 2011-12-06 NOTE — Progress Notes (Signed)
Erin Rivers is a long time patient of Dr Kriste Basque. She has seen Dr Juanda Chance and Tenny Craw in cardiology. She was hospitalized in 11/11 for pneumonia and ? diastolic failure. She has chronic afib since 2002 that Dr Juanda Chance elected to Rx with rate control and anticoagulation. Last echo 11/11 showed severe biatrial enlargement with severe MR bileaflet prolapse and normal LV function and EF. Discussed advanced directives which she really hadn't thought about. Encouraged her to discuss this further with Dr Kriste Basque.. Had a great visit to [place] to see her grand daughter  ROS: Denies fever, malais, weight loss, blurry vision, decreased visual acuity, cough, sputum, SOB, hemoptysis, pleuritic pain, palpitaitons, heartburn, abdominal pain, melena, lower extremity edema, claudication, or rash.  All other systems reviewed and negative  General: Affect appropriate Healthy:  appears stated age HEENT: normal Neck supple with no adenopathy JVP normal no bruits no thyromegaly Lungs clear with no wheezing and good diaphragmatic motion Heart:  S1/S2 MR murmur, no rub, gallop or click PMI normal Abdomen: benighn, BS positve, no tenderness, no AAA no bruit.  No HSM or HJR Distal pulses intact with no bruits No edema Neuro non-focal Skin warm and dry No muscular weakness   Current Outpatient Prescriptions  Medication Sig Dispense Refill  . Ascorbic Acid (VITAMIN C) 500 MG tablet Take 500 mg by mouth daily.        Marland Kitchen aspirin 81 MG tablet Take 81 mg by mouth daily.        . Calcium Carbonate-Vitamin D (CALTRATE 600+D) 600-400 MG-UNIT per tablet Take 1 tablet by mouth daily.        . Cholecalciferol (VITAMIN D) 1000 UNITS capsule Take 1,000 Units by mouth daily.        . digoxin (LANOXIN) 0.125 MG tablet TAKE 1 TABLET ONCE DAILY  90 tablet  3  . furosemide (LASIX) 40 MG tablet Take 1 1/2 tablets daily in am  135 tablet  3  . ibandronate (BONIVA) 150 MG tablet TAKE 1 TABLET EVERY 30 DAYS  3 tablet  3  . levothyroxine  (SYNTHROID, LEVOTHROID) 75 MCG tablet TAKE 1 TABLET ONCE DAILY  90 tablet  3  . metoprolol (LOPRESSOR) 50 MG tablet TAKE 1 TABLET TWICE A DAY  180 tablet  49  . potassium chloride (KLOR-CON) 20 MEQ packet Take 1 tablet in am  135 tablet  3  . Probiotic Product (PRO-FLORA CONCENTRATE) CAPS Take 1 capsule by mouth daily.        . simvastatin (ZOCOR) 40 MG tablet TAKE 1 TABLET ONCE A DAY  90 tablet  49  . vitamin E 400 UNIT capsule Take 400 Units by mouth daily.        Marland Kitchen warfarin (COUMADIN) 2.5 MG tablet Take 1 tablet (2.5 mg total) by mouth as directed.  14 tablet  0    Allergies  Codeine  Electrocardiogram:  afib rate 71  LVH  And digoxen effect.  Assessment and Plan

## 2011-12-06 NOTE — Patient Instructions (Signed)
Your physician wants you to follow-up in:  6 MONTHS WITH DR NISHAN  You will receive a reminder letter in the mail two months in advance. If you don't receive a letter, please call our office to schedule the follow-up appointment. Your physician recommends that you continue on your current medications as directed. Please refer to the Current Medication list given to you today. 

## 2011-12-06 NOTE — Assessment & Plan Note (Signed)
Good rate control and anticoagulation.  Despite age she is still a candidate for anticoagulation

## 2011-12-14 ENCOUNTER — Telehealth: Payer: Self-pay | Admitting: Pulmonary Disease

## 2011-12-14 MED ORDER — IBANDRONATE SODIUM 150 MG PO TABS: ORAL_TABLET | ORAL | Status: DC

## 2011-12-14 NOTE — Telephone Encounter (Signed)
lmomtcb x1--rx has been sent for boniva

## 2011-12-15 NOTE — Telephone Encounter (Signed)
Pt 's husband informed that rx for Boniva was sent to [facility].

## 2011-12-17 ENCOUNTER — Other Ambulatory Visit: Payer: Self-pay | Admitting: *Deleted

## 2011-12-17 MED ORDER — IBANDRONATE SODIUM 150 MG PO TABS: ORAL_TABLET | ORAL | Status: DC

## 2011-12-20 ENCOUNTER — Telehealth: Payer: Self-pay | Admitting: Pulmonary Disease

## 2011-12-20 NOTE — Telephone Encounter (Signed)
I spoke with pharmacists and they wanted to clarify directions for Boniva. They state it should be lay flat for 60 minutes, not 30 mins. I advised that was correct. rx corrected.  Nothing further needed. Carron Curie, CMA

## 2011-12-22 ENCOUNTER — Other Ambulatory Visit: Payer: Self-pay | Admitting: Pulmonary Disease

## 2012-01-07 ENCOUNTER — Telehealth: Payer: Self-pay | Admitting: Pulmonary Disease

## 2012-01-07 ENCOUNTER — Ambulatory Visit (INDEPENDENT_AMBULATORY_CARE_PROVIDER_SITE_OTHER): Payer: Medicare Other

## 2012-01-07 DIAGNOSIS — I4891 Unspecified atrial fibrillation: Secondary | ICD-10-CM

## 2012-01-07 DIAGNOSIS — Z7901 Long term (current) use of anticoagulants: Secondary | ICD-10-CM

## 2012-01-07 MED ORDER — POTASSIUM CHLORIDE CRYS ER 20 MEQ PO TBCR: 20.0000 meq | EXTENDED_RELEASE_TABLET | Freq: Every day | ORAL | Status: DC

## 2012-01-07 NOTE — Telephone Encounter (Signed)
Rx was sent to pharmacy. LMOM for pt's spouse to be aware that this was done.

## 2012-01-13 ENCOUNTER — Telehealth: Payer: Self-pay | Admitting: Pulmonary Disease

## 2012-01-13 NOTE — Telephone Encounter (Signed)
Called and spoke with pt.  Pt states she received letter in the mail that she is due for mammogram.  Pt states she had one done last year.  Pt denies hx of breast ca and she states mammogram last year was ok.  Pt wanted to know if she needed to go ahead and have one done again, is this necessary? Given her age?   SN, please advise.  Thanks!

## 2012-01-14 NOTE — Telephone Encounter (Signed)
I spoke with pt and made her aware of SN response. She states she does not do monthly self breast exams bc she finds to many lumps. Pt states she is going to just get a mammogram done this year. Nothing further was needed

## 2012-01-14 NOTE — Telephone Encounter (Signed)
Per SN---If she is doing monthly self exams and doesn't feel any lumps or changes then she can skin a year and do it next year.

## 2012-01-16 ENCOUNTER — Other Ambulatory Visit: Payer: Self-pay | Admitting: Cardiovascular Disease

## 2012-01-17 ENCOUNTER — Other Ambulatory Visit: Payer: Self-pay | Admitting: Pulmonary Disease

## 2012-01-17 DIAGNOSIS — Z1231 Encounter for screening mammogram for malignant neoplasm of breast: Secondary | ICD-10-CM

## 2012-01-31 ENCOUNTER — Telehealth: Payer: Self-pay | Admitting: Pulmonary Disease

## 2012-01-31 NOTE — Telephone Encounter (Signed)
Called, spoke with pt who is requesting I inform her husband of SN's recs.   Spoke with pt's husband, Mr. Rambeau.  Advised SN does want pt to keep OV with TP on Wednesday, May 22 at 9:45, cont alternating hot/cold packs, and use an arm wrap.  Advised if symptoms worsen prior to OV to pls call back or seek emergency care if needed.  He verbalized understanding of these recs.

## 2012-01-31 NOTE — Telephone Encounter (Signed)
Per SN: Keep this apt w/ TP, Continue alternating hot/cold packs, use arm wrap

## 2012-01-31 NOTE — Telephone Encounter (Signed)
Called, spoke with pt who states she has a bruise on her left arm above the elbow x 3 wks.  It is slightly bigger than a nickel.  Pt states her left arm is hurting and feels numb from the bruise.  She is unsure how she got this bruise -- is on coumadin.  Denies it being swollen or hot to touch.  She has tried alternating hot and cold packs with only relief for a short period of time.  She is requesting OV -- scheduled with TP on Wednesday, May 22 at 9:45 am -- pt aware but would like Dr. Jodelle Green recs until then.  Pls advise.  Thank you.    [facility]  Allergies verified  Allergies  Allergen Reactions  . Codeine     REACTION: Nausea

## 2012-02-02 ENCOUNTER — Encounter: Payer: Self-pay | Admitting: Adult Health

## 2012-02-02 ENCOUNTER — Ambulatory Visit (INDEPENDENT_AMBULATORY_CARE_PROVIDER_SITE_OTHER): Payer: Medicare Other | Admitting: Adult Health

## 2012-02-02 VITALS — BP 108/68 | HR 63 | Temp 97.0°F | Ht 62.0 in | Wt 106.6 lb

## 2012-02-02 DIAGNOSIS — M7520 Bicipital tendinitis, unspecified shoulder: Secondary | ICD-10-CM

## 2012-02-02 MED ORDER — PREDNISONE 20 MG PO TABS: ORAL_TABLET | ORAL | Status: DC

## 2012-02-02 NOTE — Patient Instructions (Signed)
Prednisone 40mg  daily with food for 3 days  ICE to arm/shoulder Twice daily  As needed  X 20 min  Tylenol XS As needed  Pain  Please contact office for sooner follow up if symptoms do not improve or worsen or seek emergency care

## 2012-02-02 NOTE — Progress Notes (Signed)
Subjective:    Patient ID: Erin Rivers, female    DOB: 01-Mar-1921, 78 y.o.   MRN: [id number]  HPI 78 y/o WFwith known hx of  CHF w/ severe MR and chr AFib, Hyperlipidemia, Hypothyroid, Osteopenia, etc... she is followed for Cards by Walker Kehr.  ~  December 04, 2010:  80mo ROV- she is improved, walking daily & planning trip to [place] at the end of May...    Cards>  She has chr AFib, Diastolic HF, severe MVP/ MR, & pulmHTN; on Metoprolol, Digoxin, Lasix/ KCl, Coumadin, ASA; she saw [facility] 1/12 & he wanted her to discuss advanced directive- she has living will, she is not ready to think about NCB/ hospice care/ etc...  Labs today w/ Chems= normal, BNP= 531; Rec- continue same meds.    Chol>  On Simva40 + low fat diet & FLP today shows TChol 150, TG 118, HDL 43, LDL 84; rec- continue same...    Thyroid>  Hx hypothyroid on Synthroid 41mcg/d;  TSH today= 0.75, tol well w/o palpit etc (continue same)...    Osteopenia>  On Boniva monthly + calcium, MVI, Vit D 100u daily;  Last BMD w/ TScore -1.8 in Spine, due for f/u but she wants to wait.  ~  March 10, 2011:  80mo ROV & she is doing well, had a wonderful trip to [place] w/o any difficulty reported;  Breathing well & denies cough, sputum, ch in DOE etc;  Denies CP, palpit, syncope, etc;  Stable on her current meds for AFib DD, MR & PulmHTN...    LABS today showed BMet=wnl, CBC=ok, & BNP= 430 on Lasix 40mg - taking 1.5 tabs daily> no changes made today.  ~  June 09, 2011:  80mo ROV & her CC today is contipation & we discussed Miralax, Senakot, etc;  She saw Walker Kehr 9/12- remains stable, no change in meds, she understands that she is not an operative candidate; he again asked that we discuss advanced directive and as noted in March- she has a living will & is not yet ready to discuss NCB, DNR, hospice & the like...  She notes that her breathing is good; denies CP, palpit, ch in DOE/SOB, edema; she has chr AFib w/ rate control & Coumadin; Chol ok on Simva40,  & Thyroid ok on Synthroid75...  She requests Flu vaccine today.  ~  October 13, 2011:  28mo ROV & she is generally stable w/ her chr AFib,, diastolic CHF, severe MR, & PulmHTN- on Lasix 40mg - taking 1.5 tabs daily along w/ K20- 1.5 daily as well (labs showed BNP= 636 & K=5.5 & we decr the K supplement to just one Qam);  She had a recent trip to [place] & did satis- she denies SOB (+DOE w/o change), no CP/ palpit, no edema;  Continue same & coumadin clinic... See prob list below>>  02/02/2012 Acute OV  Complains of some bruising and aching in the left arm x3 weeks.  Very sore along upper arm and shoulder on left  No known injury.  Pain on touch and try to lift arm up over head.  Leaving for annual trip to [place] next week.  No fever, arm weaknes, neck pain , speech/visual changes.  No otc used .           Problem List:    HEARING LOSS (ICD-389.9) - she wears digital hearing aides...  ALLERGIC RHINITIS (ICD-477.9) - uses OTC meds Prn...  Hx of POSITIVE PPD (ICD-795.5) - no known Tb exposure... asymptomatic without cough, sputum,  hemoptysis, etc... 11/11 c/o mild dyspnea but more trouble getting air "in"... ~  CXR 11/09 mild cardiomeg, atherosclerotic Ao, clear lungs, mild scoliosis. ~  CXR 11/10 showed similar- cardiomeg, sl incr markings, NAD. ~  CXR 11/11 showed similar cardiomeg but R>L basilar airsp dis vs atx vs effusion (improved w/ diuresis).  MITRAL VALVE PROLAPSE (ICD-424.0) -  MITRAL REGURGITATION (ICD-396.3) -  DIASTOLIC CHF -  ATRIAL FIBRILLATION, CHRONIC (ICD-427.31) - she was followed by Walker Kehr for Cardiology- his notes are reviewed... AFib dx in 2002 treated w/ rate control on METOPROLOL 50mg Bid, ASA 81mg /d and COUMADIN- followed in the coumadin clinic... l2DEcho 6/08 showed bileaflet MVP w/ mod to severe MR...  ~  5/11:  she remains asymptomatic without CP, palpit, dizziness, syncope, dyspnea, or edema... no symptoms of TIA/ stroke... she remains very active and doing  well- walks >63mi per day... ~  11/11:  presents w/ incr SOB- w/ activ & at rest, can't get a DB, sighs help, min cough, no sputum, no f/c/s, feels "tight" but no wheezing... **NOTE> CXR w/ cardiomeg & basilar edema, Labs w/ Hg=10.9, BNP>1000... Adm for diuresis- 2DEcho showed mild LVH, EF=55-60%, no regional wall motion abn, severe MR, pulmHTN w/ PAsys=55... disch on DIGOXIN 0.125mg /d & LASIX 40mg /d... ~  Post hosp check 11/11:  improved overall, Dig= 1.6, BNP= 822, Creat= 0.9> rec incr Lasix to 60mg /d & K20 to 1.5 tabs/d as well. ~  Labs 3/12 showed BNP= 532 ~  6/12:  Follow up improved, had great trip to [place] w/o complic & stable on Lasix 60mg /d w/ BNP= 430... ~  9/12:  DrNishan reiterated that she is not an operative candidate (severe MR) but she is compensated & not yet ready to talk about NCB, DNR, Hospice, etc... ~  1/13:  She reports trip to [facility] & did satis; stable overall & BNP= 636 on her Lasix 60mg /d...  HYPERLIPIDEMIA (ICD-272.4) - on SIMVASTATIN 40mg /d... ~  FLP 5/09 showed TChol 181, TG 95, HDL 54, LDL 108... same med, & low fat diet. ~  FLP 5/10 showed TChol 168, TG 86, HDL 57, LDL 94 ~  FLP 5/11 showed TChol 170, TG 94, HDL 57, LDL 94 ~  FLP 3/12 showed TChol 150, Tg 118, HDL 43, LDL 84  HYPOTHYROIDISM (ICD-244.9) - now on SYNTHROID 69mcg/d... clinically euthyroid & asymptomatic... ~  labs 5/09 showed TSH= 3.71... continue same med. ~  labs 5/10 showed TSH= 3.50 ~  labs 5/11 showed TSH= 5.36 ~  labs in hosp 11/11 showed TSH= 7.77 but FreeT3 & FreeT4 were WNL, they incr Synthroid to 64mcg/d anyway & we will watch HR & recheck. ~  Labs 3/12 on Synthroid75 showed TSH= 0.75 ~  Labs 1/13 showed TSH= 2.05  LOW BACK PAIN, CHRONIC (ICD-724.2) - she has seen a chiropractor in the past, and used Vicodin, Tramadol... currently doing satis w/ her exercise program and using OTC meds only as needed.  OSTEOPOROSIS (ICD-733.00) - prev on Actonel 35mg /wk, but changed to BONIVA 150mg /mo  per new insurance co ([employer]); plus Calcium/ Vits/ etc... ~  BMD 11/07 showed TScores -1.5 sp, and -1.3 hips... sl improved from 2004 on rx. ~  BMD 12/09 showed TScores -1.8 Spine, & -1.0 left FemNeck... continue Boniva. ~  Labs 1/13 showed Vit D level = 58... rec to continue VitD 1000u daily.  ANEMIA (ICD-285.9) ~  labs 5/10 showed Hg= 12.5, MCV= 102 ~  labs 5/11 showed Hg= 12.5, MCV= 99 ~  11/11:  presented w/ dyspnea &  Hg=10.9 ?etiology, noanemia w/u done in hosp as f/u labs showed Hg=12-13. ~  Labs 3/12 showed Hg= 12.1 ~  Labs 1/13 showed Hg= 11.6  SHINGLES, HX OF (ICD-V13.8) - we briefly discussed the shingles vaccine...  Health Maint:  she has never had a colonoscopy (now in her 70's & on Coumadin); no hx of GI problems & Hg= 13.0 (5/09) & 12.5 (5/10)... she gets the seasonal Flu vaccine yearly.   Past Surgical History  Procedure Date  . Myringotomies     Outpatient Encounter Prescriptions as of 02/02/2012  Medication Sig Dispense Refill  . Ascorbic Acid (VITAMIN C) 500 MG tablet Take 500 mg by mouth daily.        Marland Kitchen aspirin 81 MG tablet Take 81 mg by mouth daily.        . Calcium Carbonate-Vitamin D (CALTRATE 600+D) 600-400 MG-UNIT per tablet Take 1 tablet by mouth daily.        . Cholecalciferol (VITAMIN D) 1000 UNITS capsule Take 1,000 Units by mouth daily.        . digoxin (LANOXIN) 0.125 MG tablet TAKE 1 TABLET ONCE DAILY  90 tablet  3  . furosemide (LASIX) 40 MG tablet TAKE ONE AND ONE-HALF TABLETS DAILY IN THE MORNING  135 tablet  1  . ibandronate (BONIVA) 150 MG tablet Take 1 tablet every month in the morning with a full glass of water, on an empty stomach, and do not take anything else by mouth or lie down for the next 60 min.      Marland Kitchen levothyroxine (SYNTHROID, LEVOTHROID) 75 MCG tablet TAKE 1 TABLET ONCE DAILY  90 tablet  3  . metoprolol (LOPRESSOR) 50 MG tablet TAKE 1 TABLET TWICE A DAY  180 tablet  49  . potassium chloride SA (KLOR-CON M20) 20 MEQ tablet Take 1  tablet (20 mEq total) by mouth daily.  90 tablet  3  . Probiotic Product (PRO-FLORA CONCENTRATE) CAPS Take 1 capsule by mouth daily.        . simvastatin (ZOCOR) 40 MG tablet TAKE 1 TABLET ONCE A DAY  90 tablet  49  . vitamin E 400 UNIT capsule Take 400 Units by mouth daily.        Marland Kitchen warfarin (COUMADIN) 2.5 MG tablet TAKE 1 TABLET AS DIRECTED  90 tablet  1  . DISCONTD: potassium chloride (KLOR-CON) 20 MEQ packet Take 1 tablet in am  135 tablet  3    Allergies  Allergen Reactions  . Codeine     REACTION: Nausea    Review of Systems        Constitutional:   No  weight loss, night sweats,  Fevers, chills, fatigue, or  lassitude.  HEENT:   No headaches,  Difficulty swallowing,  Tooth/dental problems, or  Sore throat,                No sneezing, itching, ear ache, nasal congestion, post nasal drip,   CV:  No chest pain,  Orthopnea, PND, swelling in lower extremities, anasarca, dizziness, palpitations, syncope.   GI  No heartburn, indigestion, abdominal pain, nausea, vomiting, diarrhea, change in bowel habits, loss of appetite, bloody stools.   Resp: No shortness of breath with exertion or at rest.  No excess mucus, no productive cough,  No non-productive cough,  No coughing up of blood.  No change in color of mucus.  No wheezing.  No chest wall deformity  Skin: no rash or lesions.  GU: no dysuria, change in color  of urine, no urgency or frequency.  No flank pain, no hematuria   MS:     No back pain.  Psych:  No change in mood or affect. No depression or anxiety.  No memory loss.       Objective:   Physical Exam     WD, WN, 78 y/o WF in NAD... GENERAL:  Alert & oriented; pleasant & cooperative... HEENT:  [place]/AT, EOM-wnl, PERRLA, EACs-clear, TMs-wnl, NOSE-clear, THROAT-clear & wnl. NECK:  Supple w/ fairROM; no JVD; normal carotid impulses w/o bruits; no thyromegaly or nodules palpated; no lymphadenopathy. CHEST:  Decr BS bilat w/ few basilar rales & scat rhonchi, not coughing,  no wheezing, no consolidation. HEART:  irreg AFib w/ control VR; gr 3/6 sys murmur at apex, no rubs or gallops... ABDOMEN:  Soft & nontender; normal bowel sounds; no organomegaly or masses detected. EXT: without deformities, mild arthritic changes; no varicose veins/ venous insuffic/ or edema. Point Tenderness  along upper left arm/shoulder ROM nml but painful . Normal grips bilaterally NEURO:  CN's intact; motor testing normal; sensory testing normal; gait normal & balance OK. DERM:  No lesions noted; no rash etc..Marland Kitchen

## 2012-02-02 NOTE — Assessment & Plan Note (Signed)
Avoid Nsaids as she is on coumadin /asa  Use pred briefly   Plan:  Prednisone 40mg  daily with food for 3 days  ICE to arm/shoulder Twice daily  As needed  X 20 min  Tylenol XS As needed  Pain  Please contact office for sooner follow up if symptoms do not improve or worsen or seek emergency care

## 2012-02-21 ENCOUNTER — Ambulatory Visit (INDEPENDENT_AMBULATORY_CARE_PROVIDER_SITE_OTHER): Payer: Medicare Other | Admitting: *Deleted

## 2012-02-21 DIAGNOSIS — Z7901 Long term (current) use of anticoagulants: Secondary | ICD-10-CM

## 2012-02-21 DIAGNOSIS — I4891 Unspecified atrial fibrillation: Secondary | ICD-10-CM

## 2012-02-29 ENCOUNTER — Ambulatory Visit (HOSPITAL_COMMUNITY)
Admission: RE | Admit: 2012-02-29 | Discharge: 2012-02-29 | Disposition: A | Discharge status: Home / Self Care | Payer: Medicare Other | Source: Ambulatory Visit | Attending: Pulmonary Disease | Admitting: Pulmonary Disease

## 2012-02-29 DIAGNOSIS — Z1231 Encounter for screening mammogram for malignant neoplasm of breast: Secondary | ICD-10-CM | POA: Insufficient documentation

## 2012-03-08 ENCOUNTER — Telehealth: Payer: Self-pay | Admitting: Pulmonary Disease

## 2012-03-08 ENCOUNTER — Other Ambulatory Visit: Payer: Self-pay | Admitting: Pulmonary Disease

## 2012-03-08 DIAGNOSIS — R0600 Dyspnea, unspecified: Secondary | ICD-10-CM

## 2012-03-08 DIAGNOSIS — I509 Heart failure, unspecified: Secondary | ICD-10-CM

## 2012-03-08 DIAGNOSIS — I10 Essential (primary) hypertension: Secondary | ICD-10-CM

## 2012-03-08 DIAGNOSIS — D649 Anemia, unspecified: Secondary | ICD-10-CM

## 2012-03-08 DIAGNOSIS — E7800 Pure hypercholesterolemia, unspecified: Secondary | ICD-10-CM

## 2012-03-08 DIAGNOSIS — E039 Hypothyroidism, unspecified: Secondary | ICD-10-CM

## 2012-03-08 NOTE — Telephone Encounter (Signed)
Spoke with patient-aware that labs are in [facility] and she will come by on Thursday or Friday to have labs done.

## 2012-03-08 NOTE — Telephone Encounter (Signed)
Per SN---ok for pt to come in for her fasting labs.  These are already in the computer.  thanks

## 2012-03-08 NOTE — Telephone Encounter (Signed)
Please advise SN thanks 

## 2012-03-10 ENCOUNTER — Other Ambulatory Visit (INDEPENDENT_AMBULATORY_CARE_PROVIDER_SITE_OTHER): Payer: Medicare Other

## 2012-03-10 DIAGNOSIS — I509 Heart failure, unspecified: Secondary | ICD-10-CM

## 2012-03-10 DIAGNOSIS — D649 Anemia, unspecified: Secondary | ICD-10-CM

## 2012-03-10 DIAGNOSIS — R0609 Other forms of dyspnea: Secondary | ICD-10-CM

## 2012-03-10 DIAGNOSIS — E7800 Pure hypercholesterolemia, unspecified: Secondary | ICD-10-CM

## 2012-03-10 DIAGNOSIS — R0600 Dyspnea, unspecified: Secondary | ICD-10-CM

## 2012-03-10 LAB — CBC WITH DIFFERENTIAL/PLATELET
Eosinophils Relative: 1.2 % (ref 0.0–5.0)
HCT: 36 % (ref 36.0–46.0)
Hemoglobin: 11.5 g/dL — ABNORMAL LOW (ref 12.0–15.0)
Lymphs Abs: 1.1 10*3/uL (ref 0.7–4.0)
MCV: 93.9 fl (ref 78.0–100.0)
Monocytes Absolute: 0.7 10*3/uL (ref 0.1–1.0)
Monocytes Relative: 9.9 % (ref 3.0–12.0)
Neutro Abs: 5 10*3/uL (ref 1.4–7.7)
Platelets: 183 10*3/uL (ref 150.0–400.0)
RDW: 17 % — ABNORMAL HIGH (ref 11.5–14.6)
WBC: 6.9 10*3/uL (ref 4.5–10.5)

## 2012-03-10 LAB — BASIC METABOLIC PANEL
BUN: 15 mg/dL (ref 6–23)
Chloride: 101 mEq/L (ref 96–112)
Glucose, Bld: 90 mg/dL (ref 70–99)
Potassium: 4.6 mEq/L (ref 3.5–5.1)
Sodium: 137 mEq/L (ref 135–145)

## 2012-03-10 LAB — LIPID PANEL
HDL: 46.8 mg/dL (ref >39.00)
LDL Cholesterol: 89 mg/dL (ref 0–99)
Total CHOL/HDL Ratio: 3
VLDL: 25 mg/dL (ref 0.0–40.0)

## 2012-03-10 LAB — BRAIN NATRIURETIC PEPTIDE: Pro B Natriuretic peptide (BNP): 584 pg/mL — ABNORMAL HIGH (ref 0.0–100.0)

## 2012-03-13 ENCOUNTER — Ambulatory Visit (INDEPENDENT_AMBULATORY_CARE_PROVIDER_SITE_OTHER): Payer: Medicare Other | Admitting: Pulmonary Disease

## 2012-03-13 ENCOUNTER — Encounter: Payer: Self-pay | Admitting: Pulmonary Disease

## 2012-03-13 VITALS — BP 108/60 | HR 55 | Temp 96.9°F | Ht 62.0 in | Wt 107.8 lb

## 2012-03-13 DIAGNOSIS — E039 Hypothyroidism, unspecified: Secondary | ICD-10-CM

## 2012-03-13 DIAGNOSIS — E7800 Pure hypercholesterolemia, unspecified: Secondary | ICD-10-CM

## 2012-03-13 DIAGNOSIS — M810 Age-related osteoporosis without current pathological fracture: Secondary | ICD-10-CM

## 2012-03-13 DIAGNOSIS — I4891 Unspecified atrial fibrillation: Secondary | ICD-10-CM

## 2012-03-13 DIAGNOSIS — I059 Rheumatic mitral valve disease, unspecified: Secondary | ICD-10-CM

## 2012-03-13 DIAGNOSIS — D649 Anemia, unspecified: Secondary | ICD-10-CM

## 2012-03-13 DIAGNOSIS — I5033 Acute on chronic diastolic (congestive) heart failure: Secondary | ICD-10-CM

## 2012-03-13 DIAGNOSIS — M545 Low back pain: Secondary | ICD-10-CM

## 2012-03-13 DIAGNOSIS — I080 Rheumatic disorders of both mitral and aortic valves: Secondary | ICD-10-CM

## 2012-03-13 MED ORDER — TRAMADOL HCL 50 MG PO TABS: 50.0000 mg | ORAL_TABLET | Freq: Three times a day (TID) | ORAL | Status: AC | PRN

## 2012-03-13 NOTE — Progress Notes (Signed)
Subjective:    Patient ID: Erin Rivers, female    DOB: 03/05/1921, 78 y.o.   MRN: [id number]  HPI 78 y/o WF here for a follow up visit... she has multiple medical problems including:  CHF w/ severe MR and chr AFib, Hyperlipidemia, Hypothyroid, Osteopenia, etc... she is followed for Cards by Walker Kehr.  ~  December 04, 2010:  72mo ROV- she is improved, walking daily & planning trip to [place] at the end of May...    Cards>  She has chr AFib, Diastolic HF, severe MVP/ MR, & pulmHTN; on Metoprolol, Digoxin, Lasix/ KCl, Coumadin, ASA; she saw [facility] 1/12 & he wanted her to discuss advanced directive- she has living will, she is not ready to think about NCB/ hospice care/ etc...  Labs today w/ Chems= normal, BNP= 531; Rec- continue same meds.    Chol>  On Simva40 + low fat diet & FLP today shows TChol 150, TG 118, HDL 43, LDL 84; rec- continue same...    Thyroid>  Hx hypothyroid on Synthroid 54mcg/d;  TSH today= 0.75, tol well w/o palpit etc (continue same)...    Osteopenia>  On Boniva monthly + calcium, MVI, Vit D 100u daily;  Last BMD w/ TScore -1.8 in Spine, due for f/u but she wants to wait.  ~  March 10, 2011:  72mo ROV & she is doing well, had a wonderful trip to [place] w/o any difficulty reported;  Breathing well & denies cough, sputum, ch in DOE etc;  Denies CP, palpit, syncope, etc;  Stable on her current meds for AFib DD, MR & PulmHTN...    LABS today showed BMet=wnl, CBC=ok, & BNP= 430 on Lasix 40mg - taking 1.5 tabs daily> no changes made today.  ~  June 09, 2011:  72mo ROV & her CC today is contipation & we discussed Miralax, Senakot, etc;  She saw Walker Kehr 9/12- remains stable, no change in meds, she understands that she is not an operative candidate; he again asked that we discuss advanced directive and as noted in March- she has a living will & is not yet ready to discuss NCB, DNR, hospice & the like...  She notes that her breathing is good; denies CP, palpit, ch in DOE/SOB, edema; she  has chr AFib w/ rate control & Coumadin; Chol ok on Simva40, & Thyroid ok on Synthroid75...  She requests Flu vaccine today.  ~  October 13, 2011:  31mo ROV & she is generally stable w/ her chr AFib,, diastolic CHF, severe MR, & PulmHTN- on Lasix 40mg - taking 1.5 tabs daily along w/ K20- 1.5 daily as well (labs showed BNP= 636 & K=5.5 & we decr the K supplement to just one Qam);  She had a recent trip to [place] [employer] & did satis- she denies SOB (+DOE w/o change), no CP/ palpit, no edema;  Continue same & coumadin clinic... See prob list below>>  ~  March 13, 2012:  70mo ROV & Erin Rivers's CC is some left arm pain just above the elbow> I offered Ortho eval & Rx but she would prefer to try rest, heat & Tramadol first... She & her husb had another great trip to [place] in [place] several months ago & they did quite well, also went to [facility] on the return leg of the voyage...    We reviewed prob list, meds, xrays and labs> see below>> LABS 7/13:  FLP- at goals on Simva40;  Chems- wnl;  CBC- Hg=11.5 MCV=94;  BNP=584  Problem List:    HEARING LOSS (ICD-389.9) - she wears digital hearing aides...  ALLERGIC RHINITIS (ICD-477.9) - uses OTC meds Prn...  Hx of POSITIVE PPD (ICD-795.5) - no known Tb exposure... asymptomatic without cough, sputum, hemoptysis, etc... 11/11 c/o mild dyspnea but more trouble getting air "in"... ~  CXR 11/09 mild cardiomeg, atherosclerotic Ao, clear lungs, mild scoliosis. ~  CXR 11/10 showed similar- cardiomeg, sl incr markings, NAD. ~  CXR 11/11 showed similar cardiomeg but R>L basilar airsp dis vs atx vs effusion (improved w/ diuresis).  MITRAL VALVE PROLAPSE (ICD-424.0) -  MITRAL REGURGITATION (ICD-396.3) -  DIASTOLIC CHF -  ATRIAL FIBRILLATION, CHRONIC (ICD-427.31) - she was followed by Walker Kehr for Cardiology- his notes are reviewed... AFib dx in 2002 treated w/ rate control on METOPROLOL 50mg Bid, ASA 81mg /d and COUMADIN- followed in the coumadin clinic... l2DEcho 6/08  showed bileaflet MVP w/ mod to severe MR...  ~  5/11:  she remains asymptomatic without CP, palpit, dizziness, syncope, dyspnea, or edema... no symptoms of TIA/ stroke... she remains very active and doing well- walks >68mi per day... ~  11/11:  presents w/ incr SOB- w/ activ & at rest, can't get a DB, sighs help, min cough, no sputum, no f/c/s, feels "tight" but no wheezing... **NOTE> CXR w/ cardiomeg & basilar edema, Labs w/ Hg=10.9, BNP>1000... Adm for diuresis- 2DEcho showed mild LVH, EF=55-60%, no regional wall motion abn, severe MR, pulmHTN w/ PAsys=55... disch on DIGOXIN 0.125mg /d & LASIX 40mg /d... ~  Post hosp check 11/11:  improved overall, Dig= 1.6, BNP= 822, Creat= 0.9> rec incr Lasix to 60mg /d & K20 to 1.5 tabs/d as well. ~  Labs 3/12 showed BNP= 532 ~  6/12:  Follow up improved, had great trip to [place] w/o complic & stable on Lasix 60mg /d w/ BNP= 430... ~  9/12:  DrNishan reiterated that she is not an operative candidate (severe MR) but she is compensated & not yet ready to talk about NCB, DNR, Hospice, etc... ~  1/13:  She reports trip to [facility] & did satis; stable overall & BNP= 636 on her Lasix 60mg /d... ~  7/13:  She had great trip to [place] [employer]; BNP= 584 on Lasix60...  HYPERLIPIDEMIA (ICD-272.4) - on SIMVASTATIN 40mg /d... ~  FLP 5/09 showed TChol 181, TG 95, HDL 54, LDL 108... same med, & low fat diet. ~  FLP 5/10 showed TChol 168, TG 86, HDL 57, LDL 94 ~  FLP 5/11 showed TChol 170, TG 94, HDL 57, LDL 94 ~  FLP 3/12 showed TChol 150, Tg 118, HDL 43, LDL 84 ~  FLP 6/13 on Simva40 showed TChol 161, TG 125, HDL 47, LDL 89  HYPOTHYROIDISM (ICD-244.9) - now on SYNTHROID 65mcg/d... clinically euthyroid & asymptomatic... ~  labs 5/09 showed TSH= 3.71... continue same med. ~  labs 5/10 showed TSH= 3.50 ~  labs 5/11 showed TSH= 5.36 ~  labs in hosp 11/11 showed TSH= 7.77 but FreeT3 & FreeT4 were WNL, they incr Synthroid to 37mcg/d anyway & we will watch HR & recheck. ~  Labs  3/12 on Synthroid75 showed TSH= 0.75 ~  Labs 1/13 showed TSH= 2.05  LOW BACK PAIN, CHRONIC (ICD-724.2) - she has seen a chiropractor in the past, and used Vicodin, Tramadol... currently doing satis w/ her exercise program and using OTC meds only as needed.  OSTEOPOROSIS (ICD-733.00) - prev on Actonel 35mg /wk, but changed to BONIVA 150mg /mo per new insurance co ([employer]); plus Calcium/ Vits/ etc... ~  BMD 11/07 showed TScores -1.5  sp, and -1.3 hips... sl improved from 2004 on rx. ~  BMD 12/09 showed TScores -1.8 Spine, & -1.0 left FemNeck... continue Boniva. ~  Labs 1/13 showed Vit D level = 58... rec to continue VitD 1000u daily.  ANEMIA (ICD-285.9) ~  labs 5/10 showed Hg= 12.5, MCV= 102 ~  labs 5/11 showed Hg= 12.5, MCV= 99 ~  11/11:  presented w/ dyspnea & Hg=10.9 ?etiology, noanemia w/u done in hosp as f/u labs showed Hg=12-13. ~  Labs 3/12 showed Hg= 12.1 ~  Labs 1/13 showed Hg= 11.6 ~  Labs 7/13 showed Hg= 11.5  SHINGLES, HX OF (ICD-V13.8) - we briefly discussed the shingles vaccine...  Health Maint:  she has never had a colonoscopy (now in her 61's & on Coumadin); no hx of GI problems & Hg= 13.0 (5/09) & 12.5 (5/10)... she gets the seasonal Flu vaccine yearly.   Past Surgical History  Procedure Date  . Myringotomies     Outpatient Encounter Prescriptions as of 03/13/2012  Medication Sig Dispense Refill  . Ascorbic Acid (VITAMIN C) 500 MG tablet Take 500 mg by mouth daily.        Marland Kitchen aspirin 81 MG tablet Take 81 mg by mouth daily.        . Calcium Carbonate-Vitamin D (CALTRATE 600+D) 600-400 MG-UNIT per tablet Take 1 tablet by mouth daily.        . Cholecalciferol (VITAMIN D) 1000 UNITS capsule Take 1,000 Units by mouth daily.        . digoxin (LANOXIN) 0.125 MG tablet TAKE 1 TABLET ONCE DAILY  90 tablet  3  . furosemide (LASIX) 40 MG tablet TAKE ONE AND ONE-HALF TABLETS DAILY IN THE MORNING  135 tablet  1  . ibandronate (BONIVA) 150 MG tablet Take 1 tablet every month in  the morning with a full glass of water, on an empty stomach, and do not take anything else by mouth or lie down for the next 60 min.      Marland Kitchen levothyroxine (SYNTHROID, LEVOTHROID) 75 MCG tablet TAKE 1 TABLET ONCE DAILY  90 tablet  3  . metoprolol (LOPRESSOR) 50 MG tablet TAKE 1 TABLET TWICE A DAY  180 tablet  49  . potassium chloride SA (KLOR-CON M20) 20 MEQ tablet Take 1 tablet (20 mEq total) by mouth daily.  90 tablet  3  . Probiotic Product (PRO-FLORA CONCENTRATE) CAPS Take 1 capsule by mouth daily.        . simvastatin (ZOCOR) 40 MG tablet TAKE 1 TABLET ONCE A DAY  90 tablet  49  . vitamin E 400 UNIT capsule Take 400 Units by mouth daily.        Marland Kitchen warfarin (COUMADIN) 2.5 MG tablet TAKE 1 TABLET AS DIRECTED  90 tablet  1  . DISCONTD: predniSONE (DELTASONE) 20 MG tablet 2 tabs daily x 3 days -take with food  6 tablet  0    Allergies  Allergen Reactions  . Codeine     REACTION: Nausea    Review of Systems        See HPI - all other systems neg except as noted...  The patient complains of dyspnea on exertion.  The patient denies anorexia, fever, weight loss, weight gain, vision loss, decreased hearing, hoarseness, chest pain, syncope, peripheral edema, prolonged cough, headaches, hemoptysis, abdominal pain, melena, hematochezia, severe indigestion/heartburn, hematuria, incontinence, muscle weakness, suspicious skin lesions, transient blindness, difficulty walking, depression, unusual weight change, abnormal bleeding, enlarged lymph nodes, and angioedema.  Objective:   Physical Exam     WD, WN, 78 y/o WF in NAD... GENERAL:  Alert & oriented; pleasant & cooperative... HEENT:  [place]/AT, EOM-wnl, PERRLA, EACs-clear, TMs-wnl, NOSE-clear, THROAT-clear & wnl. NECK:  Supple w/ fairROM; no JVD; normal carotid impulses w/o bruits; no thyromegaly or nodules palpated; no lymphadenopathy. CHEST:  Decr BS bilat w/ few basilar rales & scat rhonchi, not coughing, no wheezing, no consolidation. HEART:   irreg AFib w/ control VR; gr 3/6 sys murmur at apex, no rubs or gallops... ABDOMEN:  Soft & nontender; normal bowel sounds; no organomegaly or masses detected. EXT: without deformities, mild arthritic changes; no varicose veins/ venous insuffic/ or edema. NEURO:  CN's intact; motor testing normal; sensory testing normal; gait normal & balance OK. DERM:  No lesions noted; no rash etc...  RADIOLOGY DATA:  Reviewed in the EPIC EMR & discussed w/ the patient...    >>CXR 11/11 showed cardiomeg, improved aeration w/ resolution of interstitial edema, trace pleural effusions & atx...  LABORATORY DATA:  Reviewed in the EPIC EMR & discussed w/ the patient...    Assessment & Plan:   DYSPNEA, multifactorial>  This is improved, diuresis has helped, rec to continue exercise...  CHF, Diastolic dysfunction>  Continue Lasix 40mg - taking 1.5 tabs daily (& KCl- one daily)...  AFib>  Followed by Walker Kehr, on coumadin in the CC, continue same meds & incr exercise...  HYPERLIPID>  Stable on Simva40 + diet etc...  HYPOTHYROID>  She remains stable on the Synthroid 59mcg/d...  DJD, LBP, Osteopenia>  Stable on her exerc program + Boniva etc...  Anemia>  Hg is stable ~11-12 range, continue vit supplements & watch for any bleeding...   Patient's Medications  New Prescriptions   TRAMADOL (ULTRAM) 50 MG TABLET    Take 1 tablet (50 mg total) by mouth 3 (three) times daily as needed for pain.  Previous Medications   ASCORBIC ACID (VITAMIN C) 500 MG TABLET    Take 500 mg by mouth daily.     ASPIRIN 81 MG TABLET    Take 81 mg by mouth daily.     CALCIUM CARBONATE-VITAMIN D (CALTRATE 600+D) 600-400 MG-UNIT PER TABLET    Take 1 tablet by mouth daily.     CHOLECALCIFEROL (VITAMIN D) 1000 UNITS CAPSULE    Take 1,000 Units by mouth daily.     DIGOXIN (LANOXIN) 0.125 MG TABLET    TAKE 1 TABLET ONCE DAILY   FUROSEMIDE (LASIX) 40 MG TABLET    TAKE ONE AND ONE-HALF TABLETS DAILY IN THE MORNING   IBANDRONATE (BONIVA) 150  MG TABLET    Take 1 tablet every month in the morning with a full glass of water, on an empty stomach, and do not take anything else by mouth or lie down for the next 60 min.   LEVOTHYROXINE (SYNTHROID, LEVOTHROID) 75 MCG TABLET    TAKE 1 TABLET ONCE DAILY   METOPROLOL (LOPRESSOR) 50 MG TABLET    TAKE 1 TABLET TWICE A DAY   POTASSIUM CHLORIDE SA (KLOR-CON M20) 20 MEQ TABLET    Take 1 tablet (20 mEq total) by mouth daily.   PROBIOTIC PRODUCT (PRO-FLORA CONCENTRATE) CAPS    Take 1 capsule by mouth daily.     SIMVASTATIN (ZOCOR) 40 MG TABLET    TAKE 1 TABLET ONCE A DAY   VITAMIN E 400 UNIT CAPSULE    Take 400 Units by mouth daily.     WARFARIN (COUMADIN) 2.5 MG TABLET    TAKE 1 TABLET  AS DIRECTED  Modified Medications   No medications on file  Discontinued Medications   PREDNISONE (DELTASONE) 20 MG TABLET    2 tabs daily x 3 days -take with food

## 2012-03-13 NOTE — Patient Instructions (Addendum)
Today we updated your med list in our EPIC system...    Continue your current medications the same...  We reviewed your recent blood work together> continue your same meds and supplements..  Try a heating pad for the left arm pain as needed...  We also wrote a new prescription for TRAMADOL 50mg  to try one tab up to 3 times daily as needed for the pain...  Call for any problems...  Let's plan another f/u visit in 6 months.Marland KitchenMarland Kitchen

## 2012-03-17 ENCOUNTER — Ambulatory Visit (INDEPENDENT_AMBULATORY_CARE_PROVIDER_SITE_OTHER): Payer: Medicare Other | Admitting: *Deleted

## 2012-03-17 DIAGNOSIS — I4891 Unspecified atrial fibrillation: Secondary | ICD-10-CM

## 2012-03-17 DIAGNOSIS — Z7901 Long term (current) use of anticoagulants: Secondary | ICD-10-CM

## 2012-03-17 LAB — POCT INR: INR: 2.2

## 2012-03-22 ENCOUNTER — Other Ambulatory Visit: Payer: Self-pay | Admitting: Pulmonary Disease

## 2012-04-14 ENCOUNTER — Ambulatory Visit (INDEPENDENT_AMBULATORY_CARE_PROVIDER_SITE_OTHER): Payer: Medicare Other

## 2012-04-14 DIAGNOSIS — I4891 Unspecified atrial fibrillation: Secondary | ICD-10-CM

## 2012-04-14 DIAGNOSIS — Z7901 Long term (current) use of anticoagulants: Secondary | ICD-10-CM

## 2012-04-14 LAB — POCT INR: INR: 2.7

## 2012-05-11 ENCOUNTER — Emergency Department (HOSPITAL_COMMUNITY)
Admission: EM | Admit: 2012-05-11 | Discharge: 2012-05-11 | Disposition: A | Discharge status: Home / Self Care | Payer: Medicare Other | Attending: Emergency Medicine | Admitting: Emergency Medicine

## 2012-05-11 ENCOUNTER — Encounter (HOSPITAL_COMMUNITY): Payer: Self-pay | Admitting: Adult Health

## 2012-05-11 ENCOUNTER — Emergency Department (HOSPITAL_COMMUNITY): Payer: Medicare Other

## 2012-05-11 DIAGNOSIS — E039 Hypothyroidism, unspecified: Secondary | ICD-10-CM | POA: Insufficient documentation

## 2012-05-11 DIAGNOSIS — Z87891 Personal history of nicotine dependence: Secondary | ICD-10-CM | POA: Insufficient documentation

## 2012-05-11 DIAGNOSIS — I509 Heart failure, unspecified: Secondary | ICD-10-CM | POA: Insufficient documentation

## 2012-05-11 DIAGNOSIS — Z7901 Long term (current) use of anticoagulants: Secondary | ICD-10-CM | POA: Insufficient documentation

## 2012-05-11 DIAGNOSIS — I4891 Unspecified atrial fibrillation: Secondary | ICD-10-CM | POA: Insufficient documentation

## 2012-05-11 DIAGNOSIS — Z886 Allergy status to analgesic agent status: Secondary | ICD-10-CM | POA: Insufficient documentation

## 2012-05-11 DIAGNOSIS — I059 Rheumatic mitral valve disease, unspecified: Secondary | ICD-10-CM | POA: Insufficient documentation

## 2012-05-11 DIAGNOSIS — R079 Chest pain, unspecified: Secondary | ICD-10-CM

## 2012-05-11 DIAGNOSIS — E7800 Pure hypercholesterolemia, unspecified: Secondary | ICD-10-CM | POA: Insufficient documentation

## 2012-05-11 DIAGNOSIS — M810 Age-related osteoporosis without current pathological fracture: Secondary | ICD-10-CM | POA: Insufficient documentation

## 2012-05-11 DIAGNOSIS — I10 Essential (primary) hypertension: Secondary | ICD-10-CM | POA: Insufficient documentation

## 2012-05-11 DIAGNOSIS — E785 Hyperlipidemia, unspecified: Secondary | ICD-10-CM | POA: Insufficient documentation

## 2012-05-11 DIAGNOSIS — R0789 Other chest pain: Secondary | ICD-10-CM | POA: Insufficient documentation

## 2012-05-11 LAB — CBC
MCH: 30 pg (ref 26.0–34.0)
MCHC: 32.2 g/dL (ref 30.0–36.0)
RDW: 15.7 % — ABNORMAL HIGH (ref 11.5–15.5)

## 2012-05-11 LAB — BASIC METABOLIC PANEL
Calcium: 10.4 mg/dL (ref 8.4–10.5)
GFR calc Af Amer: 69 mL/min — ABNORMAL LOW (ref >90)
GFR calc non Af Amer: 60 mL/min — ABNORMAL LOW (ref >90)
Potassium: 4.4 mEq/L (ref 3.5–5.1)
Sodium: 136 mEq/L (ref 135–145)

## 2012-05-11 LAB — POCT I-STAT TROPONIN I: Troponin i, poc: 0.01 ng/mL (ref 0.00–0.08)

## 2012-05-11 NOTE — ED Provider Notes (Signed)
History     CSN: [id number]  Arrival date & time 05/11/12  1938   None     Chief Complaint  Patient presents with  . Chest Pain    (Consider location/radiation/quality/duration/timing/severity/associated sxs/prior treatment) Patient is a 78 y.o. female presenting with chest pain. The history is provided by the patient.  Chest Pain The chest pain began 3 - 5 hours ago. Duration of episode(s) is 1 second. Chest pain occurs intermittently. The chest pain is resolved. Associated with: unknown. At its most intense, the pain is at 5/10. The pain is currently at 0/10. The severity of the pain is mild. The quality of the pain is described as sharp. The pain does not radiate. Exacerbated by: nothing. Pertinent negatives for primary symptoms include no fever, no fatigue, no shortness of breath, no cough, no abdominal pain, no nausea, no vomiting and no dizziness. She tried nothing for the symptoms.     Past Medical History  Diagnosis Date  . MVP (mitral valve prolapse)   . Hyperlipidemia   . CHF (congestive heart failure)   . Edema   . Epistaxis   . Dyspnea   . Hypertension   . Osteoporosis   . Anemia   . Hypothyroid   . Atrial fibrillation   . Mitral regurgitation   . Hypercholesteremia   . Pneumonia   . Hearing loss   . Allergic rhinitis   . Chronic low back pain   . Shingles     Past Surgical History  Procedure Date  . Myringotomies     Family History  Problem Relation Age of Onset  . Stroke Mother   . Cancer Father   . Heart disease Brother     s/p MI    History  Substance Use Topics  . Smoking status: Former Smoker -- 10 years    Types: Cigarettes    Quit date: 09/13/1968  . Smokeless tobacco: Not on file   Comment: only smoked 1 cig per day  . Alcohol Use: Yes     1 glass of wine with dinner    OB History    Grav Para Term Preterm Abortions TAB SAB Ect Mult Living                  Review of Systems  Constitutional: Negative for fever and fatigue.    HENT: Negative for congestion, drooling and neck pain.   Eyes: Negative for pain.  Respiratory: Negative for cough and shortness of breath.   Cardiovascular: Positive for chest pain.  Gastrointestinal: Negative for nausea, vomiting, abdominal pain and diarrhea.  Genitourinary: Negative for dysuria and hematuria.  Musculoskeletal: Negative for back pain and gait problem.  Skin: Negative for color change.  Neurological: Negative for dizziness and headaches.  Hematological: Negative for adenopathy.  Psychiatric/Behavioral: Negative for behavioral problems.  All other systems reviewed and are negative.    Allergies  Codeine  Home Medications   Current Outpatient Rx  Name Route Sig Dispense Refill  . VITAMIN C 500 MG PO TABS Oral Take 500 mg by mouth daily.      . ASPIRIN 81 MG PO TABS Oral Take 81 mg by mouth daily.      Marland Kitchen CALCIUM CARBONATE-VITAMIN D 600-400 MG-UNIT PO TABS Oral Take 1 tablet by mouth daily.      Marland Kitchen VITAMIN D 1000 UNITS PO CAPS Oral Take 1,000 Units by mouth daily.      Marland Kitchen DIGOXIN 0.125 MG PO TABS Oral Take 0.125 mg by  mouth daily.    . FUROSEMIDE 40 MG PO TABS Oral Take 60 mg by mouth every morning.    . IBANDRONATE SODIUM 150 MG PO TABS  Take 1 tablet every month in the morning with a full glass of water, on an empty stomach, and do not take anything else by mouth or lie down for the next 60 min.    Marland Kitchen LEVOTHYROXINE SODIUM 75 MCG PO TABS Oral Take 75 mcg by mouth daily.    Marland Kitchen METOPROLOL TARTRATE 50 MG PO TABS Oral Take 50 mg by mouth 2 (two) times daily.    Marland Kitchen POTASSIUM CHLORIDE 20 MEQ PO PACK Oral Take 10-20 mEq by mouth 2 (two) times daily. 1 tab in a.m. And 1/2 tab in p.m.    . PRO-FLORA CONCENTRATE PO CAPS Oral Take 1 capsule by mouth daily.      Marland Kitchen SIMVASTATIN 40 MG PO TABS Oral Take 40 mg by mouth every evening.    Marland Kitchen VITAMIN E 400 UNITS PO CAPS Oral Take 400 Units by mouth daily.      . WARFARIN SODIUM 2.5 MG PO TABS Oral Take 1.25-2.5 mg by mouth every evening. Pt  takes 2.5 mg on tue and thu and then 1.25 mg all other days      BP 141/72  Pulse 62  Temp 98.3 F (36.8 C) (Oral)  Resp 16  SpO2 97%  Physical Exam  Nursing note and vitals reviewed. Constitutional: She is oriented to person, place, and time. She appears well-developed and well-nourished.  HENT:  Head: Normocephalic.  Mouth/Throat: Oropharynx is clear and moist. No oropharyngeal exudate.  Eyes: Conjunctivae and EOM are normal. Pupils are equal, round, and reactive to light.  Neck: Normal range of motion. Neck supple.  Cardiovascular: Normal rate, regular rhythm, normal heart sounds and intact distal pulses.  Exam reveals no gallop and no friction rub.   No murmur heard. Pulmonary/Chest: Effort normal and breath sounds normal. No respiratory distress. She has no wheezes.  Abdominal: Soft. Bowel sounds are normal. There is no tenderness. There is no rebound and no guarding.  Musculoskeletal: Normal range of motion. She exhibits no edema and no tenderness.  Neurological: She is alert and oriented to person, place, and time.  Skin: Skin is warm and dry.       No obvious cutaneous changes over left lateral ribs.   Psychiatric: She has a normal mood and affect. Her behavior is normal.    ED Course  Procedures (including critical care time)  Labs Reviewed  CBC - Abnormal; Notable for the following:    Hemoglobin 11.8 (*)     RDW 15.7 (*)     All other components within normal limits  BASIC METABOLIC PANEL - Abnormal; Notable for the following:    Glucose, Bld 108 (*)     GFR calc non Af Amer 60 (*)     GFR calc Af Amer 69 (*)     All other components within normal limits  DIGOXIN LEVEL - Abnormal; Notable for the following:    Digoxin Level 0.7 (*)     All other components within normal limits  POCT I-STAT TROPONIN I   Dg Chest 2 View  05/11/2012  *RADIOLOGY REPORT*  Clinical Data: Pain.  CHEST - 2 VIEW  Comparison: PA and lateral chest 07/21/2010.  Findings: There is mild  cardiomegaly.  No edema.  No consolidative process, pneumothorax or effusion.  IMPRESSION: Cardiomegaly without acute disease.   Original Report Authenticated By:  THOMAS L. D'ALESSIO, M.D.      1. Chest pain      Date: 05/11/2012  Rate: 65  Rhythm: atrial fibrillation  QRS Axis: normal  Intervals: PR indeterminate, QTc  ST/T Wave abnormalities: nonspecific ST changes  Conduction Disutrbances:none  Narrative Interpretation: ST depression slightly more prominent in V4-V5 when compared to previous ecg  Old EKG Reviewed: changes noted    MDM  12:01 AM 78 y.o. female w hx of CHF, afib on coumadin, Mitral regurg pw sudden intermittent sharp pains in her left lateral ribs during dinner at approx 5pm this evening. Pt notes her sx lasted approx 1 hr, she is asx here. Pt AFVSS, appears well on exam. She notes that her pain is similar to when she had shingles previously. No cutaneous manifestations noted on exam. Screening labs/imaging non-contrib. Low suspicion for cardiac cause as pain is very atypical, delta trop unnecessary.   Pt discharged by Dr. Fonnie Jarvis. Return precautions given by him.   Clinical Impression 1. Chest pain            Purvis Sheffield, MD 05/12/12 0001

## 2012-05-11 NOTE — ED Notes (Signed)
Reports sudden onset while eating dinner of left sided chest  Associated with feeling like heart was beating fast. pain  feels like "I am being stabbed with an ice pick" denies SOB, denies nausea., denies chest pain at this time

## 2012-05-11 NOTE — ED Provider Notes (Signed)
I saw and evaluated the patient, reviewed the resident's note and I agree with the findings including ECG and plan.  Sudden, fleeting, sharp stabbing left sided CPs last only few seconds at a time without radiation or associated Sxs.  Hurman Horn, MD 05/13/12 [phone]

## 2012-05-12 ENCOUNTER — Ambulatory Visit (INDEPENDENT_AMBULATORY_CARE_PROVIDER_SITE_OTHER): Payer: Medicare Other | Admitting: Pharmacist

## 2012-05-12 DIAGNOSIS — Z7901 Long term (current) use of anticoagulants: Secondary | ICD-10-CM

## 2012-05-12 DIAGNOSIS — I4891 Unspecified atrial fibrillation: Secondary | ICD-10-CM

## 2012-05-16 ENCOUNTER — Telehealth: Payer: Self-pay | Admitting: Pulmonary Disease

## 2012-05-16 NOTE — Telephone Encounter (Signed)
I spoke with pt and she stated she went to ED last Thursday night for "left sided pain" that happened while she was eating. She went to the ED and everything was normal. She is fine now and is no longer having this pain. She was just calling to make SN aware of this. Will forward to him as an FYI.

## 2012-06-20 ENCOUNTER — Telehealth: Payer: Self-pay | Admitting: Pulmonary Disease

## 2012-06-21 NOTE — Telephone Encounter (Signed)
Forms have been completed and faxed back.  Called and spoke with pt and she is aware.  Nothing further is needed.

## 2012-06-23 ENCOUNTER — Ambulatory Visit (INDEPENDENT_AMBULATORY_CARE_PROVIDER_SITE_OTHER): Payer: Medicare Other

## 2012-06-23 ENCOUNTER — Other Ambulatory Visit: Payer: Self-pay | Admitting: Pulmonary Disease

## 2012-06-23 DIAGNOSIS — Z7901 Long term (current) use of anticoagulants: Secondary | ICD-10-CM

## 2012-06-23 DIAGNOSIS — I4891 Unspecified atrial fibrillation: Secondary | ICD-10-CM

## 2012-06-23 DIAGNOSIS — Z23 Encounter for immunization: Secondary | ICD-10-CM

## 2012-06-26 DIAGNOSIS — Z23 Encounter for immunization: Secondary | ICD-10-CM

## 2012-07-12 ENCOUNTER — Other Ambulatory Visit: Payer: Self-pay | Admitting: Pulmonary Disease

## 2012-07-12 ENCOUNTER — Telehealth: Payer: Self-pay | Admitting: Internal Medicine

## 2012-07-12 MED ORDER — SIMVASTATIN 40 MG PO TABS: 40.0000 mg | ORAL_TABLET | Freq: Every day | ORAL | Status: DC

## 2012-07-12 NOTE — Telephone Encounter (Signed)
Pt aware that rx has been sent to [facility] for her.

## 2012-08-04 ENCOUNTER — Ambulatory Visit (INDEPENDENT_AMBULATORY_CARE_PROVIDER_SITE_OTHER): Payer: Medicare Other

## 2012-08-04 DIAGNOSIS — I4891 Unspecified atrial fibrillation: Secondary | ICD-10-CM

## 2012-08-04 DIAGNOSIS — Z7901 Long term (current) use of anticoagulants: Secondary | ICD-10-CM

## 2012-08-09 ENCOUNTER — Other Ambulatory Visit: Payer: Self-pay | Admitting: Pulmonary Disease

## 2012-09-15 ENCOUNTER — Ambulatory Visit (INDEPENDENT_AMBULATORY_CARE_PROVIDER_SITE_OTHER): Payer: Medicare Other

## 2012-09-15 DIAGNOSIS — Z7901 Long term (current) use of anticoagulants: Secondary | ICD-10-CM

## 2012-09-15 DIAGNOSIS — I4891 Unspecified atrial fibrillation: Secondary | ICD-10-CM

## 2012-09-15 LAB — POCT INR: INR: 2.1

## 2012-09-20 ENCOUNTER — Ambulatory Visit (INDEPENDENT_AMBULATORY_CARE_PROVIDER_SITE_OTHER): Payer: Medicare Other | Admitting: Pulmonary Disease

## 2012-09-20 ENCOUNTER — Encounter: Payer: Self-pay | Admitting: Pulmonary Disease

## 2012-09-20 VITALS — BP 106/64 | HR 73 | Temp 97.3°F | Ht 62.0 in | Wt 108.4 lb

## 2012-09-20 DIAGNOSIS — E039 Hypothyroidism, unspecified: Secondary | ICD-10-CM

## 2012-09-20 DIAGNOSIS — M810 Age-related osteoporosis without current pathological fracture: Secondary | ICD-10-CM

## 2012-09-20 DIAGNOSIS — M545 Low back pain: Secondary | ICD-10-CM

## 2012-09-20 DIAGNOSIS — D649 Anemia, unspecified: Secondary | ICD-10-CM

## 2012-09-20 DIAGNOSIS — I059 Rheumatic mitral valve disease, unspecified: Secondary | ICD-10-CM

## 2012-09-20 DIAGNOSIS — I5033 Acute on chronic diastolic (congestive) heart failure: Secondary | ICD-10-CM

## 2012-09-20 DIAGNOSIS — E7800 Pure hypercholesterolemia, unspecified: Secondary | ICD-10-CM

## 2012-09-20 DIAGNOSIS — I080 Rheumatic disorders of both mitral and aortic valves: Secondary | ICD-10-CM

## 2012-09-20 DIAGNOSIS — I4891 Unspecified atrial fibrillation: Secondary | ICD-10-CM

## 2012-09-20 NOTE — Patient Instructions (Addendum)
Today we updated your med list in our EPIC system...    Continue your current medications the same...  Keep up the good work & stay as active as poss...  Call for any problems or if we can be of service in any way...  Let's plan a follow up visit in 6 months w/ FASTING blood work at that time.Marland KitchenMarland Kitchen

## 2012-09-20 NOTE — Progress Notes (Signed)
Subjective:    Patient ID: Erin Rivers, female    DOB: Apr 05, 1921, 79 y.o.   MRN: [id number]  HPI 79 y/o WF here for a follow up visit... she has multiple medical problems including:  CHF w/ severe MR and chr AFib, Hyperlipidemia, Hypothyroid, Osteopenia, etc... she is followed for Cards by Walker Kehr.  ~  December 04, 2010:  52mo ROV- she is improved, walking daily & planning trip to [place] at the end of May...    Cards>  She has chr AFib, Diastolic HF, severe MVP/ MR, & pulmHTN; on Metoprolol, Digoxin, Lasix/ KCl, Coumadin, ASA; she saw [facility] 1/12 & he wanted her to discuss advanced directive- she has living will, she is not ready to think about NCB/ hospice care/ etc...  Labs today w/ Chems= normal, BNP= 531; Rec- continue same meds.    Chol>  On Simva40 + low fat diet & FLP today shows TChol 150, TG 118, HDL 43, LDL 84; rec- continue same...    Thyroid>  Hx hypothyroid on Synthroid 29mcg/d;  TSH today= 0.75, tol well w/o palpit etc (continue same)...    Osteopenia>  On Boniva monthly + calcium, MVI, Vit D 100u daily;  Last BMD w/ TScore -1.8 in Spine, due for f/u but she wants to wait.  ~  March 10, 2011:  52mo ROV & she is doing well, had a wonderful trip to [place] w/o any difficulty reported;  Breathing well & denies cough, sputum, ch in DOE etc;  Denies CP, palpit, syncope, etc;  Stable on her current meds for AFib DD, MR & PulmHTN...    LABS today showed BMet=wnl, CBC=ok, & BNP= 430 on Lasix 40mg - taking 1.5 tabs daily> no changes made today.  ~  June 09, 2011:  52mo ROV & her CC today is contipation & we discussed Miralax, Senakot, etc;  She saw Walker Kehr 9/12- remains stable, no change in meds, she understands that she is not an operative candidate; he again asked that we discuss advanced directive and as noted in March- she has a living will & is not yet ready to discuss NCB, DNR, hospice & the like...  She notes that her breathing is good; denies CP, palpit, ch in DOE/SOB, edema; she  has chr AFib w/ rate control & Coumadin; Chol ok on Simva40, & Thyroid ok on Synthroid75...  She requests Flu vaccine today.  ~  October 13, 2011:  57mo ROV & she is generally stable w/ her chr AFib,, diastolic CHF, severe MR, & PulmHTN- on Lasix 40mg - taking 1.5 tabs daily along w/ K20- 1.5 daily as well (labs showed BNP= 636 & K=5.5 & we decr the K supplement to just one Qam);  She had a recent trip to [place] & did satis- she denies SOB (+DOE w/o change), no CP/ palpit, no edema;  Continue same & coumadin clinic... See prob list below>>  ~  March 13, 2012:  44mo ROV & Erin Rivers's CC is some left arm pain just above the elbow> I offered Ortho eval & Rx but she would prefer to try rest, heat & Tramadol first... She & her husb had another great trip to [place] in [place] several months ago & they did quite well, also went to [facility] on the return leg of the voyage...    We reviewed prob list, meds, xrays and labs> see below>> LABS 7/13:  FLP- at goals on Simva40;  Chems- wnl;  CBC- Hg=11.5 MCV=94;  BNP=584  ~  September 20, 2012:  45mo ROV & she ha had a busy interval- moved to [place], grandson got married, Thanksgiving, Christmas, etc...  We reviewed the following medical problems during today's office visit>>     HOH> she has hearing aides bilat, canals are clear but she notes incr difficulty hearing & advised to f/u w/ Audiology...    Hx +PPD> no known Tb exposure; she denies cough, sputum, SOB, hemoptysis, etc; last CXR 8/13 remained clear, NAD...    MVP, severeMR, DiastolicCHF> on ASA81, Metop50Bid, Lasix40-1.5tabs/d, K20/d; went to ER 8/13 for sharp fleeting atypCP- neg ER eval & was reassured...    ChrAFib> on Coumadin in CC & Lanoxin0.125; followed by Walker Kehr on Coumadin & rate control- stable...    Hyperlipid> on Simva40; last FLP 6/13 revealed TChol 161, TG 125, HDL 47, LDL 89    Hypothy> on Levothy75; last TSH 1/13 was 2.05    LBP, Osteopenia> on Boniva150/mo, MVI, VitD; doing satis w/ exercise  program; last BMD 2009 w/ TScore -1.8 in spine 7 we discussed f/u BMD...    Anemia> she takes vitamins & a probiotic daily; Hg is stable in the 11-12 range... We reviewed prob list, meds, xrays and labs> see below for updates >>           Problem List:    HEARING LOSS (ICD-389.9) - she wears digital hearing aides...  ALLERGIC RHINITIS (ICD-477.9) - uses OTC meds Prn...  Hx of POSITIVE PPD (ICD-795.5) - no known Tb exposure... asymptomatic without cough, sputum, hemoptysis, etc... 11/11 c/o mild dyspnea but more trouble getting air "in"... ~  CXR 11/09 mild cardiomeg, atherosclerotic Ao, clear lungs, mild scoliosis. ~  CXR 11/10 showed similar- cardiomeg, sl incr markings, NAD. ~  CXR 11/11 showed similar cardiomeg but R>L basilar airsp dis vs atx vs effusion (improved w/ diuresis). ~  CXR 8/13 showed cardiomegaly, no edema, clear lungs, NAD...  MITRAL VALVE PROLAPSE (ICD-424.0) -  MITRAL REGURGITATION (ICD-396.3) -  DIASTOLIC CHF -  ATRIAL FIBRILLATION, CHRONIC (ICD-427.31) - she was followed by Walker Kehr for Cardiology- his notes are reviewed... AFib dx in 2002 treated w/ rate control on METOPROLOL 50mg Bid, ASA 81mg /d and COUMADIN- followed in the coumadin clinic... l2DEcho 6/08 showed bileaflet MVP w/ mod to severe MR...  ~  5/11:  she remains asymptomatic without CP, palpit, dizziness, syncope, dyspnea, or edema... no symptoms of TIA/ stroke... she remains very active and doing well- walks >92mi per day... ~  11/11:  presents w/ incr SOB- w/ activ & at rest, can't get a DB, sighs help, min cough, no sputum, no f/c/s, feels "tight" but no wheezing... **NOTE> CXR w/ cardiomeg & basilar edema, Labs w/ Hg=10.9, BNP>1000... Adm for diuresis- 2DEcho showed mild LVH, EF=55-60%, no regional wall motion abn, severe MR, pulmHTN w/ PAsys=55... disch on DIGOXIN 0.125mg /d & LASIX 40mg /d... ~  Post hosp check 11/11:  improved overall, Dig= 1.6, BNP= 822, Creat= 0.9> rec incr Lasix to 60mg /d & K20 to 1.5  tabs/d as well. ~  Labs 3/12 showed BNP= 532 ~  6/12:  Follow up improved, had great trip to [place] w/o complic & stable on Lasix 60mg /d w/ BNP= 430... ~  9/12:  DrNishan reiterated that she is not an operative candidate (severe MR) but she is compensated & not yet ready to talk about NCB, DNR, Hospice, etc... ~  1/13:  She reports trip to [facility] & did satis; stable overall & BNP= 636 on her Lasix 60mg /d... ~  7/13:  She had great trip to [place] &  [place]; BNP= 584 on Lasix60... ~  8/13:  EKG showed AFib, rate65, NSSTTWA; presented to ER w/ fleeting sharp AtypCP, neg eval  & was reassured...  HYPERLIPIDEMIA (ICD-272.4) - on SIMVASTATIN 40mg /d... ~  FLP 5/09 showed TChol 181, TG 95, HDL 54, LDL 108... same med, & low fat diet. ~  FLP 5/10 showed TChol 168, TG 86, HDL 57, LDL 94 ~  FLP 5/11 showed TChol 170, TG 94, HDL 57, LDL 94 ~  FLP 3/12 showed TChol 150, Tg 118, HDL 43, LDL 84 ~  FLP 6/13 on Simva40 showed TChol 161, TG 125, HDL 47, LDL 89  HYPOTHYROIDISM (ICD-244.9) - now on SYNTHROID 24mcg/d... clinically euthyroid & asymptomatic... ~  labs 5/09 showed TSH= 3.71... continue same med. ~  labs 5/10 showed TSH= 3.50 ~  labs 5/11 showed TSH= 5.36 ~  labs in hosp 11/11 showed TSH= 7.77 but FreeT3 & FreeT4 were WNL, they incr Synthroid to 36mcg/d anyway & we will watch HR & recheck. ~  Labs 3/12 on Synthroid75 showed TSH= 0.75 ~  Labs 1/13 showed TSH= 2.05  LOW BACK PAIN, CHRONIC (ICD-724.2) - she has seen a chiropractor in the past, and used Vicodin, Tramadol... currently doing satis w/ her exercise program and using OTC meds only as needed.  OSTEOPOROSIS (ICD-733.00) - prev on Actonel 35mg /wk, but changed to BONIVA 150mg /mo per new insurance co ([employer]); plus Calcium/ Vits/ etc... ~  BMD 11/07 showed TScores -1.5 sp, and -1.3 hips... sl improved from 2004 on rx. ~  BMD 12/09 showed TScores -1.8 Spine, & -1.0 left FemNeck... continue Boniva. ~  Labs 1/13 showed Vit D level =  58... rec to continue VitD 1000u daily.  ANEMIA (ICD-285.9) ~  labs 5/10 showed Hg= 12.5, MCV= 102 ~  labs 5/11 showed Hg= 12.5, MCV= 99 ~  11/11:  presented w/ dyspnea & Hg=10.9 ?etiology, noanemia w/u done in hosp as f/u labs showed Hg=12-13. ~  Labs 3/12 showed Hg= 12.1 ~  Labs 1/13 showed Hg= 11.6 ~  Labs 7/13 showed Hg= 11.5  SHINGLES, HX OF (ICD-V13.8) - we briefly discussed the shingles vaccine...  Health Maint:  she has never had a colonoscopy (now in her 73's & on Coumadin); no hx of GI problems & Hg= 13.0 (5/09) & 12.5 (5/10)... she gets the seasonal Flu vaccine yearly.   Past Surgical History  Procedure Date  . Myringotomies     Outpatient Encounter Prescriptions as of 09/20/2012  Medication Sig Dispense Refill  . Ascorbic Acid (VITAMIN C) 500 MG tablet Take 500 mg by mouth daily.        Marland Kitchen aspirin 81 MG tablet Take 81 mg by mouth daily.        . Calcium Carbonate-Vitamin D (CALTRATE 600+D) 600-400 MG-UNIT per tablet Take 1 tablet by mouth daily.        . Cholecalciferol (VITAMIN D) 1000 UNITS capsule Take 1,000 Units by mouth daily.        . digoxin (LANOXIN) 0.125 MG tablet TAKE 1 TABLET ONCE DAILY  90 tablet  0  . furosemide (LASIX) 40 MG tablet TAKE ONE AND ONE-HALF TABLETS DAILY IN THE MORNING  135 tablet  6  . ibandronate (BONIVA) 150 MG tablet Take 1 tablet every month in the morning with a full glass of water, on an empty stomach, and do not take anything else by mouth or lie down for the next 60 min.      Marland Kitchen levothyroxine (SYNTHROID, LEVOTHROID) 75 MCG tablet  TAKE 1 TABLET ONCE DAILY  90 tablet  0  . metoprolol (LOPRESSOR) 50 MG tablet Take 50 mg by mouth 2 (two) times daily.      . potassium chloride (KLOR-CON) 20 MEQ packet Take 10-20 mEq by mouth 2 (two) times daily. 1 tab in a.m. And 1/2 tab in p.m.      Marland Kitchen Probiotic Product (PRO-FLORA CONCENTRATE) CAPS Take 1 capsule by mouth daily.        . simvastatin (ZOCOR) 40 MG tablet Take 1 tablet (40 mg total) by mouth at  bedtime.  30 tablet  0  . vitamin E 400 UNIT capsule Take 400 Units by mouth daily.        Marland Kitchen warfarin (COUMADIN) 2.5 MG tablet Take 1.25-2.5 mg by mouth every evening. Pt takes 2.5 mg on tue and thu and then 1.25 mg all other days        Allergies  Allergen Reactions  . Codeine     REACTION: Nausea    Review of Systems        See HPI - all other systems neg except as noted...  The patient complains of dyspnea on exertion.  The patient denies anorexia, fever, weight loss, weight gain, vision loss, decreased hearing, hoarseness, chest pain, syncope, peripheral edema, prolonged cough, headaches, hemoptysis, abdominal pain, melena, hematochezia, severe indigestion/heartburn, hematuria, incontinence, muscle weakness, suspicious skin lesions, transient blindness, difficulty walking, depression, unusual weight change, abnormal bleeding, enlarged lymph nodes, and angioedema.     Objective:   Physical Exam     WD, WN, 79 y/o WF in NAD... GENERAL:  Alert & oriented; pleasant & cooperative... HEENT:  /AT, EOM-wnl, PERRLA, EACs-clear, TMs-wnl, NOSE-clear, THROAT-clear & wnl. NECK:  Supple w/ fairROM; no JVD; normal carotid impulses w/o bruits; no thyromegaly or nodules palpated; no lymphadenopathy. CHEST:  Decr BS bilat w/ few basilar rales & scat rhonchi, not coughing, no wheezing, no consolidation. HEART:  irreg AFib w/ control VR; gr 3/6 sys murmur at apex, no rubs or gallops... ABDOMEN:  Soft & nontender; normal bowel sounds; no organomegaly or masses detected. EXT: without deformities, mild arthritic changes; no varicose veins/ venous insuffic/ or edema. NEURO:  CN's intact; motor testing normal; sensory testing normal; gait normal & balance OK. DERM:  No lesions noted; no rash etc...  RADIOLOGY DATA:  Reviewed in the EPIC EMR & discussed w/ the patient...    >>CXR 11/11 showed cardiomeg, improved aeration w/ resolution of interstitial edema, trace pleural effusions & atx...  LABORATORY  DATA:  Reviewed in the EPIC EMR & discussed w/ the patient...    Assessment & Plan:    DYSPNEA, multifactorial>  This is improved, diuresis has helped, rec to continue exercise...  CHF, Diastolic dysfunction>  Continue Lasix 40mg - taking 1.5 tabs daily (& KCl- one daily)...  AFib>  Followed by Walker Kehr, on coumadin in the CC, continue same meds & incr exercise...  HYPERLIPID>  Stable on Simva40 + diet etc...  HYPOTHYROID>  She remains stable on the Synthroid 18mcg/d...  DJD, LBP, Osteopenia>  Stable on her exerc program + Boniva etc...  Anemia>  Hg is stable ~11-12 range, continue vit supplements & watch for any bleeding...   Patient's Medications  New Prescriptions   No medications on file  Previous Medications   ASCORBIC ACID (VITAMIN C) 500 MG TABLET    Take 500 mg by mouth daily.     ASPIRIN 81 MG TABLET    Take 81 mg by mouth daily.  CALCIUM CARBONATE-VITAMIN D (CALTRATE 600+D) 600-400 MG-UNIT PER TABLET    Take 1 tablet by mouth daily.     CHOLECALCIFEROL (VITAMIN D) 1000 UNITS CAPSULE    Take 1,000 Units by mouth daily.     DIGOXIN (LANOXIN) 0.125 MG TABLET    TAKE 1 TABLET ONCE DAILY   FUROSEMIDE (LASIX) 40 MG TABLET    TAKE ONE AND ONE-HALF TABLETS DAILY IN THE MORNING   IBANDRONATE (BONIVA) 150 MG TABLET    Take 1 tablet every month in the morning with a full glass of water, on an empty stomach, and do not take anything else by mouth or lie down for the next 60 min.   LEVOTHYROXINE (SYNTHROID, LEVOTHROID) 75 MCG TABLET    TAKE 1 TABLET ONCE DAILY   METOPROLOL (LOPRESSOR) 50 MG TABLET    Take 50 mg by mouth 2 (two) times daily.   POTASSIUM CHLORIDE (KLOR-CON) 20 MEQ PACKET    Take 10-20 mEq by mouth 2 (two) times daily. 1 tab in a.m. And 1/2 tab in p.m.   PROBIOTIC PRODUCT (PRO-FLORA CONCENTRATE) CAPS    Take 1 capsule by mouth daily.     SIMVASTATIN (ZOCOR) 40 MG TABLET    Take 1 tablet (40 mg total) by mouth at bedtime.   VITAMIN E 400 UNIT CAPSULE    Take 400 Units  by mouth daily.     WARFARIN (COUMADIN) 2.5 MG TABLET    Take 1.25-2.5 mg by mouth every evening. Pt takes 2.5 mg on tue and thu and then 1.25 mg all other days  Modified Medications   No medications on file  Discontinued Medications   No medications on file

## 2012-10-06 ENCOUNTER — Telehealth: Payer: Self-pay | Admitting: Pulmonary Disease

## 2012-10-06 MED ORDER — METHYLPREDNISOLONE 4 MG PO KIT: PACK | ORAL | Status: DC

## 2012-10-06 MED ORDER — AZITHROMYCIN 250 MG PO TABS: ORAL_TABLET | ORAL | Status: DC

## 2012-10-06 NOTE — Telephone Encounter (Signed)
Per SN---call in zpak #1  Take as directed, medrol dosepak, mucinex 2 po bid, fluids.  thanks

## 2012-10-06 NOTE — Telephone Encounter (Signed)
I spoke with pt. She c/o dry cough, chest congestion, runny nose, HA x Tuesday. No f/c/s/n/v/PND/facial pressure/chest tx/wheezing. She has been drinking hot w/ lemon. Requesting further recs. Please advise SN thanks Last OV 09/20/12 Allergies  Allergen Reactions  . Codeine     REACTION: Nausea

## 2012-10-06 NOTE — Telephone Encounter (Signed)
I spoke with pt and is aware of SN recs. She voiced her understanding and RX has been sent. Nothing further was needed

## 2012-10-12 ENCOUNTER — Telehealth: Payer: Self-pay | Admitting: Pulmonary Disease

## 2012-10-12 MED ORDER — TRAMADOL HCL 50 MG PO TABS: 50.0000 mg | ORAL_TABLET | Freq: Three times a day (TID) | ORAL | Status: DC | PRN

## 2012-10-12 MED ORDER — HYDROCODONE-HOMATROPINE 5-1.5 MG/5ML PO SYRP: 5.0000 mL | ORAL_SOLUTION | Freq: Four times a day (QID) | ORAL | Status: DC | PRN

## 2012-10-12 NOTE — Telephone Encounter (Signed)
Pt says she finished the Zpak and Pred Pak yesterday but is feeling no better. She still c/o cough with clear mucus, runny nose, chest tightness, weakness and decreased appetite. She denies any fever, sob, wheezing or sore throat. SN, pls advise. Allergies  Allergen Reactions  . Codeine     REACTION: Nausea   q

## 2012-10-12 NOTE — Telephone Encounter (Signed)
Per SN---cough with sputum is clear, use the hydromet  1 tsp every 6 hours prn , for the pain use the tramadol 50 mg   1 po tid prn and use the mucinex 2 po bid and increase water intake.   thanks

## 2012-10-12 NOTE — Telephone Encounter (Signed)
Prescriptions called to [facility] t and pt is aware. Pt verbalized understanding.

## 2012-10-13 ENCOUNTER — Telehealth: Payer: Self-pay | Admitting: Pulmonary Disease

## 2012-10-13 NOTE — Telephone Encounter (Signed)
I spoke with spouse. He stated pt to the hydromet that contains codeine. It has caused her to be dizzy. It caused her to be sleepy. I advised him that it a side effect of hydromet. He stated that is also on the bottle. He just wants the okay from SN for pt to keep taking this medication. Pt does feel some better this morning. Please advise thanks

## 2012-10-13 NOTE — Telephone Encounter (Signed)
Per SN---for her cough she will need to use otc meds---lozenges, delsym robitussin dm.  All rx cough meds have the codeine in them.  thanks

## 2012-10-13 NOTE — Telephone Encounter (Signed)
Called, spoke with pt's spouse.  I informed him of below per Dr. Kriste Basque.  He verbalized understanding and voiced no further questions or concerns at this time.

## 2012-11-03 ENCOUNTER — Ambulatory Visit (INDEPENDENT_AMBULATORY_CARE_PROVIDER_SITE_OTHER): Payer: Medicare Other

## 2012-11-03 DIAGNOSIS — Z7901 Long term (current) use of anticoagulants: Secondary | ICD-10-CM

## 2012-12-15 ENCOUNTER — Other Ambulatory Visit: Payer: Self-pay | Admitting: Pulmonary Disease

## 2012-12-15 ENCOUNTER — Ambulatory Visit (INDEPENDENT_AMBULATORY_CARE_PROVIDER_SITE_OTHER): Payer: Medicare Other | Admitting: *Deleted

## 2012-12-15 DIAGNOSIS — Z7901 Long term (current) use of anticoagulants: Secondary | ICD-10-CM

## 2012-12-15 DIAGNOSIS — I4891 Unspecified atrial fibrillation: Secondary | ICD-10-CM

## 2012-12-15 LAB — POCT INR: INR: 2.1

## 2013-01-11 ENCOUNTER — Ambulatory Visit (INDEPENDENT_AMBULATORY_CARE_PROVIDER_SITE_OTHER): Payer: Medicare Other | Admitting: Cardiovascular Disease

## 2013-01-11 ENCOUNTER — Encounter: Payer: Self-pay | Admitting: Cardiovascular Disease

## 2013-01-11 VITALS — BP 103/63 | HR 71 | Wt 105.0 lb

## 2013-01-11 DIAGNOSIS — I4891 Unspecified atrial fibrillation: Secondary | ICD-10-CM

## 2013-01-11 DIAGNOSIS — I080 Rheumatic disorders of both mitral and aortic valves: Secondary | ICD-10-CM

## 2013-01-11 DIAGNOSIS — E7800 Pure hypercholesterolemia, unspecified: Secondary | ICD-10-CM

## 2013-01-11 NOTE — Progress Notes (Signed)
Patient ID: Erin Rivers, female   DOB: 11/29/1920, 79 y.o.   MRN: [id number] Erin Rivers is a long time patient of Dr Kriste Basque. She has seen Dr Juanda Chance and Tenny Craw in cardiology. She was hospitalized in 11/11 for pneumonia and ? diastolic failure. She has chronic afib since 2002 that Dr Juanda Chance elected to Rx with rate control and anticoagulation. Last echo 11/11 showed severe biatrial enlargement with severe MR bileaflet prolapse and normal LV function and EF. Discussed advanced directives which she really hadn't thought about. Encouraged her to discuss this further with Dr Kriste Basque.. Now living at [facility] and enjoying it  ROS: Denies fever, malais, weight loss, blurry vision, decreased visual acuity, cough, sputum, SOB, hemoptysis, pleuritic pain, palpitaitons, heartburn, abdominal pain, melena, lower extremity edema, claudication, or rash.  All other systems reviewed and negative  General: Affect appropriate Healthy:  appears stated age HEENT: normal Neck supple with no adenopathy JVP normal no bruits no thyromegaly Lungs clear with no wheezing and good diaphragmatic motion Heart:  S1/S2 MR  murmur, no rub, gallop or click PMI normal Abdomen: benighn, BS positve, no tenderness, no AAA no bruit.  No HSM or HJR Distal pulses intact with no bruits No edema Neuro non-focal Skin warm and dry No muscular weakness   Current Outpatient Prescriptions  Medication Sig Dispense Refill  . Ascorbic Acid (VITAMIN C) 500 MG tablet Take 500 mg by mouth daily.        Marland Kitchen aspirin 81 MG tablet Take 81 mg by mouth daily.        Marland Kitchen azithromycin (ZITHROMAX) 250 MG tablet Take as directed  6 tablet  0  . Calcium Carbonate-Vitamin D (CALTRATE 600+D) 600-400 MG-UNIT per tablet Take 1 tablet by mouth daily.        . Cholecalciferol (VITAMIN D) 1000 UNITS capsule Take 1,000 Units by mouth daily.        . digoxin (LANOXIN) 0.125 MG tablet TAKE 1 TABLET ONCE DAILY  90 tablet  0  . furosemide (LASIX) 40 MG tablet TAKE ONE AND  ONE-HALF TABLETS DAILY IN THE MORNING  135 tablet  6  . HYDROcodone-homatropine (HYDROMET) 5-1.5 MG/5ML syrup Take 5 mLs by mouth every 6 (six) hours as needed for cough.  120 mL  0  . ibandronate (BONIVA) 150 MG tablet TAKE 1 TAB MONTHLY IN MORNING WITH FULL GLASS OF WATER ON AN EMPTY STOMACH, DO NOT EAT, LIE DOWN, TAKE OTHER MEDS FOR 60 MINUTES  3 tablet  1  . levothyroxine (SYNTHROID, LEVOTHROID) 75 MCG tablet TAKE 1 TABLET ONCE DAILY  90 tablet  0  . methylPREDNISolone (MEDROL, PAK,) 4 MG tablet Take as directed  21 tablet  0  . metoprolol (LOPRESSOR) 50 MG tablet Take 50 mg by mouth 2 (two) times daily.      . potassium chloride (KLOR-CON) 20 MEQ packet Take 10-20 mEq by mouth 2 (two) times daily. 1 tab in a.m. And 1/2 tab in p.m.      Marland Kitchen Probiotic Product (PRO-FLORA CONCENTRATE) CAPS Take 1 capsule by mouth daily.        . simvastatin (ZOCOR) 40 MG tablet Take 1 tablet (40 mg total) by mouth at bedtime.  30 tablet  0  . traMADol (ULTRAM) 50 MG tablet Take 1 tablet (50 mg total) by mouth 3 (three) times daily as needed for pain.  90 tablet  0  . vitamin E 400 UNIT capsule Take 400 Units by mouth daily.        Marland Kitchen  warfarin (COUMADIN) 2.5 MG tablet Take 1.25-2.5 mg by mouth every evening. Pt takes 2.5 mg on tue and thu and then 1.25 mg all other days       No current facility-administered medications for this visit.    Allergies  Codeine  Electrocardiogram:  afib rate 66 nonspecific STT wave changes  04/15/12  Dig effect  Assessment and Plan

## 2013-01-11 NOTE — Assessment & Plan Note (Signed)
Not a surgical candidate No congestive symptoms Continue current meds

## 2013-01-11 NOTE — Assessment & Plan Note (Signed)
Cholesterol is at goal.  Continue current dose of statin and diet Rx.  No myalgias or side effects.  F/U  LFT's in 6 months. Lab Results  Component Value Date   LDLCALC 89 03/10/2012

## 2013-01-11 NOTE — Patient Instructions (Addendum)
Your physician wants you to follow-up in:  6 MONTHS WITH DR NISHAN  You will receive a reminder letter in the mail two months in advance. If you don't receive a letter, please call our office to schedule the follow-up appointment. Your physician recommends that you continue on your current medications as directed. Please refer to the Current Medication list given to you today. 

## 2013-01-11 NOTE — Assessment & Plan Note (Signed)
Good rate control and anticoagulation Despite age still appr to be on coumadin

## 2013-01-23 ENCOUNTER — Other Ambulatory Visit: Payer: Self-pay | Admitting: Pulmonary Disease

## 2013-01-23 DIAGNOSIS — Z1231 Encounter for screening mammogram for malignant neoplasm of breast: Secondary | ICD-10-CM

## 2013-01-26 ENCOUNTER — Ambulatory Visit (INDEPENDENT_AMBULATORY_CARE_PROVIDER_SITE_OTHER): Payer: Medicare Other | Admitting: *Deleted

## 2013-01-26 DIAGNOSIS — Z7901 Long term (current) use of anticoagulants: Secondary | ICD-10-CM

## 2013-01-26 DIAGNOSIS — I4891 Unspecified atrial fibrillation: Secondary | ICD-10-CM

## 2013-01-26 LAB — POCT INR: INR: 2.1

## 2013-01-29 ENCOUNTER — Other Ambulatory Visit: Payer: Self-pay | Admitting: *Deleted

## 2013-01-29 MED ORDER — WARFARIN SODIUM 2.5 MG PO TABS: 2.5000 mg | ORAL_TABLET | ORAL | Status: DC

## 2013-01-31 ENCOUNTER — Other Ambulatory Visit: Payer: Self-pay | Admitting: Pulmonary Disease

## 2013-02-22 ENCOUNTER — Other Ambulatory Visit: Payer: Self-pay | Admitting: Pulmonary Disease

## 2013-03-02 ENCOUNTER — Ambulatory Visit (INDEPENDENT_AMBULATORY_CARE_PROVIDER_SITE_OTHER): Payer: Medicare Other

## 2013-03-02 DIAGNOSIS — I4891 Unspecified atrial fibrillation: Secondary | ICD-10-CM

## 2013-03-02 DIAGNOSIS — Z7901 Long term (current) use of anticoagulants: Secondary | ICD-10-CM

## 2013-03-05 ENCOUNTER — Ambulatory Visit (HOSPITAL_COMMUNITY)
Admission: RE | Admit: 2013-03-05 | Discharge: 2013-03-05 | Disposition: A | Discharge status: Home / Self Care | Payer: Medicare Other | Source: Ambulatory Visit | Attending: Pulmonary Disease | Admitting: Pulmonary Disease

## 2013-03-05 DIAGNOSIS — Z1231 Encounter for screening mammogram for malignant neoplasm of breast: Secondary | ICD-10-CM

## 2013-03-06 ENCOUNTER — Emergency Department (HOSPITAL_COMMUNITY): Payer: Medicare Other

## 2013-03-06 ENCOUNTER — Inpatient Hospital Stay (HOSPITAL_COMMUNITY)
Admission: EM | Admit: 2013-03-06 | Discharge: 2013-03-09 | DRG: 292 | Disposition: A | Discharge status: Home / Self Care | Payer: Medicare Other | Attending: Cardiovascular Disease | Admitting: Cardiovascular Disease

## 2013-03-06 ENCOUNTER — Encounter (HOSPITAL_COMMUNITY): Payer: Self-pay | Admitting: Emergency Medicine

## 2013-03-06 DIAGNOSIS — E440 Moderate protein-calorie malnutrition: Secondary | ICD-10-CM | POA: Insufficient documentation

## 2013-03-06 DIAGNOSIS — I340 Nonrheumatic mitral (valve) insufficiency: Secondary | ICD-10-CM | POA: Diagnosis present

## 2013-03-06 DIAGNOSIS — I509 Heart failure, unspecified: Secondary | ICD-10-CM

## 2013-03-06 DIAGNOSIS — I5033 Acute on chronic diastolic (congestive) heart failure: Principal | ICD-10-CM | POA: Diagnosis present

## 2013-03-06 DIAGNOSIS — H9190 Unspecified hearing loss, unspecified ear: Secondary | ICD-10-CM | POA: Diagnosis present

## 2013-03-06 DIAGNOSIS — I059 Rheumatic mitral valve disease, unspecified: Secondary | ICD-10-CM

## 2013-03-06 DIAGNOSIS — Z7901 Long term (current) use of anticoagulants: Secondary | ICD-10-CM

## 2013-03-06 DIAGNOSIS — E785 Hyperlipidemia, unspecified: Secondary | ICD-10-CM | POA: Diagnosis present

## 2013-03-06 DIAGNOSIS — I4891 Unspecified atrial fibrillation: Secondary | ICD-10-CM | POA: Diagnosis present

## 2013-03-06 DIAGNOSIS — E7800 Pure hypercholesterolemia, unspecified: Secondary | ICD-10-CM | POA: Diagnosis present

## 2013-03-06 DIAGNOSIS — Z87891 Personal history of nicotine dependence: Secondary | ICD-10-CM

## 2013-03-06 DIAGNOSIS — I10 Essential (primary) hypertension: Secondary | ICD-10-CM | POA: Diagnosis present

## 2013-03-06 DIAGNOSIS — M810 Age-related osteoporosis without current pathological fracture: Secondary | ICD-10-CM | POA: Diagnosis present

## 2013-03-06 DIAGNOSIS — E039 Hypothyroidism, unspecified: Secondary | ICD-10-CM | POA: Diagnosis present

## 2013-03-06 DIAGNOSIS — D649 Anemia, unspecified: Secondary | ICD-10-CM | POA: Diagnosis present

## 2013-03-06 LAB — PRO B NATRIURETIC PEPTIDE: Pro B Natriuretic peptide (BNP): 2904 pg/mL — ABNORMAL HIGH (ref 0–450)

## 2013-03-06 LAB — BASIC METABOLIC PANEL
CO2: 27 mEq/L (ref 19–32)
Glucose, Bld: 108 mg/dL — ABNORMAL HIGH (ref 70–99)
Potassium: 4.4 mEq/L (ref 3.5–5.1)
Sodium: 134 mEq/L — ABNORMAL LOW (ref 135–145)

## 2013-03-06 LAB — CBC
Hemoglobin: 11.3 g/dL — ABNORMAL LOW (ref 12.0–15.0)
MCH: 28.9 pg (ref 26.0–34.0)
MCV: 89.3 fL (ref 78.0–100.0)
RBC: 3.91 MIL/uL (ref 3.87–5.11)

## 2013-03-06 LAB — PROTIME-INR: INR: 2.39 — ABNORMAL HIGH (ref 0.00–1.49)

## 2013-03-06 LAB — TROPONIN I: Troponin I: <0.3 ng/mL (ref <0.30)

## 2013-03-06 MED ORDER — SIMVASTATIN 40 MG PO TABS
40.0000 mg | ORAL_TABLET | Freq: Every day | ORAL | Status: DC
  Administered 2013-03-06 – 2013-03-08 (×3): 40 mg via ORAL
  Filled 2013-03-06 (×4): qty 1

## 2013-03-06 MED ORDER — DIGOXIN 125 MCG PO TABS
0.1250 mg | ORAL_TABLET | Freq: Every day | ORAL | Status: DC
  Administered 2013-03-06 – 2013-03-08 (×3): 0.125 mg via ORAL
  Filled 2013-03-06 (×3): qty 1

## 2013-03-06 MED ORDER — SODIUM CHLORIDE 0.9 % IJ SOLN
3.0000 mL | Freq: Two times a day (BID) | INTRAMUSCULAR | Status: DC
  Administered 2013-03-06 – 2013-03-09 (×6): 3 mL via INTRAVENOUS

## 2013-03-06 MED ORDER — HYDROCODONE-HOMATROPINE 5-1.5 MG/5ML PO SYRP: 5.0000 mL | ORAL_SOLUTION | Freq: Four times a day (QID) | ORAL | Status: DC | PRN

## 2013-03-06 MED ORDER — ONDANSETRON HCL 4 MG/2ML IJ SOLN: 4.0000 mg | Freq: Four times a day (QID) | INTRAMUSCULAR | Status: DC | PRN

## 2013-03-06 MED ORDER — POTASSIUM CHLORIDE CRYS ER 20 MEQ PO TBCR
20.0000 meq | EXTENDED_RELEASE_TABLET | Freq: Every day | ORAL | Status: DC
  Administered 2013-03-06 – 2013-03-09 (×4): 20 meq via ORAL
  Filled 2013-03-06 (×4): qty 1

## 2013-03-06 MED ORDER — LEVOTHYROXINE SODIUM 75 MCG PO TABS
75.0000 ug | ORAL_TABLET | Freq: Every day | ORAL | Status: DC
  Administered 2013-03-07 – 2013-03-09 (×3): 75 ug via ORAL
  Filled 2013-03-06 (×4): qty 1

## 2013-03-06 MED ORDER — SODIUM CHLORIDE 0.9 % IJ SOLN: 3.0000 mL | INTRAMUSCULAR | Status: DC | PRN

## 2013-03-06 MED ORDER — ACETAMINOPHEN 325 MG PO TABS: 650.0000 mg | ORAL_TABLET | ORAL | Status: DC | PRN

## 2013-03-06 MED ORDER — WARFARIN - PHARMACIST DOSING INPATIENT
Freq: Every day | Status: DC
  Administered 2013-03-08: 18:00:00

## 2013-03-06 MED ORDER — POTASSIUM CHLORIDE 20 MEQ PO PACK: 10.0000 meq | PACK | Freq: Two times a day (BID) | ORAL | Status: DC

## 2013-03-06 MED ORDER — POTASSIUM CHLORIDE CRYS ER 10 MEQ PO TBCR
10.0000 meq | EXTENDED_RELEASE_TABLET | Freq: Every day | ORAL | Status: DC
  Administered 2013-03-06 – 2013-03-08 (×3): 10 meq via ORAL
  Filled 2013-03-06 (×4): qty 1

## 2013-03-06 MED ORDER — WARFARIN SODIUM 2.5 MG PO TABS: 2.5000 mg | ORAL_TABLET | ORAL | Status: DC

## 2013-03-06 MED ORDER — METOPROLOL TARTRATE 50 MG PO TABS
50.0000 mg | ORAL_TABLET | Freq: Two times a day (BID) | ORAL | Status: DC
  Administered 2013-03-06 – 2013-03-08 (×4): 50 mg via ORAL
  Filled 2013-03-06 (×6): qty 1

## 2013-03-06 MED ORDER — SODIUM CHLORIDE 0.9 % IV SOLN: 250.0000 mL | INTRAVENOUS | Status: DC | PRN

## 2013-03-06 MED ORDER — TRAMADOL HCL 50 MG PO TABS
50.0000 mg | ORAL_TABLET | Freq: Two times a day (BID) | ORAL | Status: DC | PRN
  Filled 2013-03-06: qty 1

## 2013-03-06 MED ORDER — FUROSEMIDE 10 MG/ML IJ SOLN
40.0000 mg | Freq: Two times a day (BID) | INTRAMUSCULAR | Status: DC
  Administered 2013-03-06 – 2013-03-09 (×6): 40 mg via INTRAVENOUS
  Filled 2013-03-06 (×9): qty 4

## 2013-03-06 MED ORDER — WARFARIN 1.25 MG HALF TABLET
1.2500 mg | ORAL_TABLET | ORAL | Status: DC
  Administered 2013-03-07: 1.25 mg via ORAL
  Filled 2013-03-06: qty 1

## 2013-03-06 MED ORDER — WARFARIN SODIUM 2.5 MG PO TABS
2.5000 mg | ORAL_TABLET | ORAL | Status: DC
  Administered 2013-03-06: 2.5 mg via ORAL
  Filled 2013-03-06 (×2): qty 1

## 2013-03-06 MED ORDER — FUROSEMIDE 10 MG/ML IJ SOLN
60.0000 mg | Freq: Once | INTRAMUSCULAR | Status: AC
  Administered 2013-03-06: 60 mg via INTRAVENOUS
  Filled 2013-03-06: qty 6

## 2013-03-06 NOTE — ED Notes (Signed)
Pt states she has been having difficulty breathing for the past few days but awoke this AM at 0500"knowing it wasn't going to go away "  Able to talk in brief sentences, but using accessory muscles, pursed lips and shallow breaths.  Denies CP.Marland Kitchen  Room air sats 93 %.  Placed on 2l [place] with sats remaining at 93.  Increased to 4l [place] with sats 95%

## 2013-03-06 NOTE — ED Notes (Signed)
Pt ordered heart healthy diet tray.  

## 2013-03-06 NOTE — ED Notes (Signed)
Urinary output of 850cc

## 2013-03-06 NOTE — Progress Notes (Signed)
ANTICOAGULATION CONSULT NOTE - Initial Consult  Pharmacy Consult for Coumadin Indication: atrial fibrillation  Allergies  Allergen Reactions  . Codeine     REACTION: Nausea/dizziness  Patient Measurements: Weight- 47.6 kg on 01/11/13 Vital Signs: Temp: 98.3 F (36.8 C) (06/24 0958) Temp src: Oral (06/24 0958) BP: 120/67 mmHg (06/24 1215) Pulse Rate: 79 (06/24 1215) Labs:  Recent Labs  03/06/13 0745 03/06/13 0814  HGB 11.3*  --   HCT 34.9*  --   PLT 203  --   LABPROT  --  25.0*  INR  --  2.39*  CREATININE 0.79  --   TROPONINI <0.30  --    Medical History: Past Medical History  Diagnosis Date  . MVP (mitral valve prolapse)   . Hyperlipidemia   . CHF (congestive heart failure)   . Edema   . Epistaxis   . Dyspnea   . Hypertension   . Osteoporosis   . Anemia   . Hypothyroid   . Atrial fibrillation   . Mitral regurgitation   . Hypercholesteremia   . Pneumonia   . Hearing loss   . Allergic rhinitis   . Chronic low back pain   . Shingles    Assessment: 79 YO pleasant female with history of permanent atrial fibrillation on chronic coumadin as well as diastolic heart failure and severe mitral regurgitation admitted with shortness of breath. Patient to continue Coumadin per pharmacy dosing while inpatient. INR is therapeutic today at 2.39. Her home dose is 2.5mg  on Sunday, Tuesday, and Thursday and 1.25mg  all other days. With her last dose taken on 03/05/13. Patient is followed by [facility]. H/H low but stable per Cardiologist outpatient notes. Platelets are within normal limits. Talked with patient- she has not had any bleeding but does report normal bruising with Coumadin.   Goal of Therapy:  INR 2-3   Plan:  1. Resume home Coumadin dose of 2.5mg  Sunday, Tuesday, Thursday and 1.25mg  all other days.  2. Daily PT/INR.   Link Snuffer, PharmD, BCPS Clinical Pharmacist [phone] 03/06/2013,3:05 PM

## 2013-03-06 NOTE — ED Notes (Signed)
Husband # [phone] Louanne Skye

## 2013-03-06 NOTE — ED Notes (Signed)
#  14 fr catheter placeds pt very sob with any exertion IV started and lasix given. Husband given coffee pt warmed up  W/ blanket and made comfortable pt talking in short sentences

## 2013-03-06 NOTE — ED Notes (Signed)
Pt sleeping, continues to express no needs. Aware of plan.

## 2013-03-06 NOTE — ED Provider Notes (Signed)
History    CSN: [id number] Arrival date & time 03/06/13  0619  First MD Initiated Contact with Patient 03/06/13 [phone]     Chief Complaint  Patient presents with  . Shortness of Breath   (Consider location/radiation/quality/duration/timing/severity/associated sxs/prior Treatment) HPI  [name] with increasing doe for several days.  This am awoke and felt very dyspneic at 0500.  Denies cough, felt warm but no known fever, chest pain but some anterior chest tightnes, denies peripheral edema.  Symptoms worse with lying flat, similar symptoms with chf.  Denies change in medicine. Taking lasix and dig, PMD Kriste Basque, card [employer].   Past Medical History  Diagnosis Date  . MVP (mitral valve prolapse)   . Hyperlipidemia   . CHF (congestive heart failure)   . Edema   . Epistaxis   . Dyspnea   . Hypertension   . Osteoporosis   . Anemia   . Hypothyroid   . Atrial fibrillation   . Mitral regurgitation   . Hypercholesteremia   . Pneumonia   . Hearing loss   . Allergic rhinitis   . Chronic low back pain   . Shingles    Past Surgical History  Procedure Laterality Date  . Myringotomies     Family History  Problem Relation Age of Onset  . Stroke Mother   . Cancer Father   . Heart disease Brother     s/p MI   History  Substance Use Topics  . Smoking status: Former Smoker -- 10 years    Types: Cigarettes    Quit date: 09/13/1968  . Smokeless tobacco: Not on file     Comment: only smoked 1 cig per day  . Alcohol Use: Yes     Comment: 1 glass of wine with dinner   OB History   Grav Para Term Preterm Abortions TAB SAB Ect Mult Living                 Review of Systems  Allergies  Codeine  Home Medications   Current Outpatient Rx  Name  Route  Sig  Dispense  Refill  . Ascorbic Acid (VITAMIN C) 500 MG tablet   Oral   Take 500 mg by mouth daily.           Marland Kitchen aspirin 81 MG tablet   Oral   Take 81 mg by mouth daily.           Marland Kitchen azithromycin (ZITHROMAX) 250 MG  tablet      Prn for dental         . Calcium Carbonate-Vitamin D (CALTRATE 600+D) 600-400 MG-UNIT per tablet   Oral   Take 1 tablet by mouth daily.           . Cholecalciferol (VITAMIN D) 1000 UNITS capsule   Oral   Take 1,000 Units by mouth daily.           Marland Kitchen DIGOX 125 MCG tablet      TAKE 1 TABLET DAILY   90 tablet   3   . furosemide (LASIX) 40 MG tablet      TAKE ONE AND ONE-HALF TABLETS DAILY IN THE MORNING   135 tablet   6   . HYDROcodone-homatropine (HYDROMET) 5-1.5 MG/5ML syrup   Oral   Take 5 mLs by mouth every 6 (six) hours as needed for cough.   120 mL   0   . ibandronate (BONIVA) 150 MG tablet      TAKE 1 TAB  MONTHLY IN MORNING WITH FULL GLASS OF WATER ON AN EMPTY STOMACH, DO NOT EAT, LIE DOWN, TAKE OTHER MEDS FOR 60 MINUTES   3 tablet   1   . levothyroxine (SYNTHROID, LEVOTHROID) 75 MCG tablet      TAKE 1 TABLET DAILY   90 tablet   3   . methylPREDNISolone (MEDROL, PAK,) 4 MG tablet      Take as directed   21 tablet   0   . metoprolol (LOPRESSOR) 50 MG tablet      TAKE 1 TABLET TWICE A DAY   180 tablet   2   . potassium chloride (KLOR-CON) 20 MEQ packet   Oral   Take 10-20 mEq by mouth 2 (two) times daily. 1 tab in a.m. And 1/2 tab in p.m.         Marland Kitchen Probiotic Product (PRO-FLORA CONCENTRATE) CAPS   Oral   Take 1 capsule by mouth daily.           . simvastatin (ZOCOR) 40 MG tablet   Oral   Take 1 tablet (40 mg total) by mouth at bedtime.   30 tablet   0   . traMADol (ULTRAM) 50 MG tablet   Oral   Take 1 tablet (50 mg total) by mouth 3 (three) times daily as needed for pain.   90 tablet   0   . vitamin E 400 UNIT capsule   Oral   Take 400 Units by mouth daily.           Marland Kitchen warfarin (COUMADIN) 2.5 MG tablet   Oral   Take 1 tablet (2.5 mg total) by mouth as directed.   90 tablet   1     90 tabs is 90 day supply    BP 150/94  Pulse 80  Temp(Src) 96.6 F (35.9 C) (Axillary)  Resp 14  SpO2 93% Physical Exam   Nursing note and vitals reviewed. Constitutional: She is oriented to person, place, and time. She appears well-developed and well-nourished.  HENT:  Head: Normocephalic and atraumatic.  Eyes: EOM are normal. Pupils are equal, round, and reactive to light.  Conjunctiva pale  Neck: Normal range of motion. Neck supple.  Cardiovascular: Normal pulses.  An irregular rhythm present.  Pulmonary/Chest: Effort normal.  Bibasilar decreased breath sounds with crackles.  Tachypnea  Abdominal: Soft. Bowel sounds are normal.  Musculoskeletal: Normal range of motion. She exhibits no edema and no tenderness.  Neurological: She is alert and oriented to person, place, and time. She has normal reflexes.  Skin: Skin is warm and dry.  Psychiatric: She has a normal mood and affect.    ED Course  Procedures (including critical care time) Labs Reviewed  BASIC METABOLIC PANEL  CBC  PRO B NATRIURETIC PEPTIDE   No results found. No diagnosis found.  Dg Chest 2 View (if Patient Has Fever And/or Copd)  03/06/2013   *RADIOLOGY REPORT*  Clinical Data: Shortness of breath.  CHEST - 2 VIEW  Comparison: 05/11/2012  Findings: There is chronic cardiomegaly.  Slight pulmonary vascular congestion with slight interstitial accentuation.  Tiny right effusion.  No significant osseous abnormality.  IMPRESSION: New pulmonary vascular accentuation, at least partially due to shallow inspiration.  However, the patient does have a new small right effusion suggesting mild heart failure.   Original Report Authenticated By: Francene Boyers, M.D.   I have reviewed the report and personally reviewed the above radiology studies.    Date: 03/06/2013  Rate: 82  Rhythm:  atrial fibrillation  QRS Axis: normal  Intervals: a fib  ST/T Wave abnormalities: nonspecific ST/T changes poor data quality, t wave inversion v5 and v6?  Conduction Disutrbances:a fib  Narrative Interpretation:   Old EKG Reviewed: changes noted rate has increased  by 17 beats minute    MDM  CHF , cardiac ischemia, infectious, clot.   Patient with probable chf with increased bnp, markings on cxr and feels improved here after iv lasix with good uop.  Patient still slightly tachypneic and oxygen requiring.  Cardiology consulted and will see,.   Hilario Quarry, MD 03/06/13 [phone]

## 2013-03-06 NOTE — ED Notes (Signed)
PT very HOH.  Placed on cardiac monitor in atrial fib rate 88.  Denies C/P.  NO swelling noted in BLE

## 2013-03-06 NOTE — ED Notes (Signed)
Dr Excell Seltzer onto see pt ,900 cc emptied from foley

## 2013-03-06 NOTE — H&P (Signed)
History and Physical  Patient ID: Erin Rivers MRN: [id number], SOB: May 15, 1921 79 y.o. Date of Encounter: 03/06/2013, 10:31 AM  Primary Physician: Michele Mcalpine, MD Primary Cardiologist: Dr Eden Emms  Chief Complaint: Shortness of breath  HPI: 79 y.o. female w/ PMHx significant for permanent atrial fibrillation, diastolic heart failure, and severe mitral regurgitation who presented to [facility] on 03/06/2013 with complaints of shortness of breath. She is a delightful lady who lives with her husband at [employer]. They were scheduled to fly to [place] today to visit their granddaughter, but she came to the ER with marked shortness of breath.   She reports a five-day history of worsening shortness of breath. She has noted abdominal fullness, orthopnea, and PND. She's had no fevers, chills, or cough. She's had to stop and rest frequently with any physical activity. Today she became short of breath at rest. She denies leg swelling, syncope, chest pain, or chest pressure.  The patient's last echocardiogram was in 2011. She was noted to have severe mitral regurgitation with moderate bileaflet prolapse at that time. Because of her advanced age she was not considered a candidate for surgery. She really has done well and states that her last hospitalization for heart failure was about 4 years ago. She continues to drive a car. She has maintained an active and independent lifestyle.  Since arrival in the emergency department, the patient has been given intravenous Lasix. She has noted marked improvement in her symptoms and her breathing is much better. Her the nursing staff, the patient was unable to even complete sentences when she first arrived.  Past Medical History  Diagnosis Date  . MVP (mitral valve prolapse)   . Hyperlipidemia   . CHF (congestive heart failure)   . Edema   . Epistaxis   . Dyspnea   . Hypertension   . Osteoporosis   . Anemia   . Hypothyroid   . Atrial fibrillation    . Mitral regurgitation   . Hypercholesteremia   . Pneumonia   . Hearing loss   . Allergic rhinitis   . Chronic low back pain   . Shingles      Surgical History:  Past Surgical History  Procedure Laterality Date  . Myringotomies       Home Meds: Prior to Admission medications   Medication Sig Start Date End Date Taking? Authorizing Provider  Ascorbic Acid (VITAMIN C) 500 MG tablet Take 500 mg by mouth daily.      Historical Provider, MD  aspirin 81 MG tablet Take 81 mg by mouth daily.      Historical Provider, MD  azithromycin (ZITHROMAX) 250 MG tablet Prn for dental 10/06/12   Michele Mcalpine, MD  Calcium Carbonate-Vitamin D (CALTRATE 600+D) 600-400 MG-UNIT per tablet Take 1 tablet by mouth daily.      Historical Provider, MD  Cholecalciferol (VITAMIN D) 1000 UNITS capsule Take 1,000 Units by mouth daily.      Historical Provider, MD  DIGOX 125 MCG tablet TAKE 1 TABLET DAILY 01/31/13   Michele Mcalpine, MD  furosemide (LASIX) 40 MG tablet TAKE ONE AND ONE-HALF TABLETS DAILY IN THE MORNING 06/23/12   Michele Mcalpine, MD  HYDROcodone-homatropine (HYDROMET) 5-1.5 MG/5ML syrup Take 5 mLs by mouth every 6 (six) hours as needed for cough. 10/12/12   Michele Mcalpine, MD  ibandronate (BONIVA) 150 MG tablet TAKE 1 TAB MONTHLY IN MORNING WITH FULL GLASS OF WATER ON AN EMPTY STOMACH, DO NOT EAT, LIE DOWN,  TAKE OTHER MEDS FOR 60 MINUTES 12/15/12   Michele Mcalpine, MD  levothyroxine (SYNTHROID, LEVOTHROID) 75 MCG tablet TAKE 1 TABLET DAILY 01/31/13   Michele Mcalpine, MD  methylPREDNISolone (MEDROL, PAK,) 4 MG tablet Take as directed 10/06/12   Michele Mcalpine, MD  metoprolol (LOPRESSOR) 50 MG tablet TAKE 1 TABLET TWICE A DAY 02/22/13   Michele Mcalpine, MD  potassium chloride (KLOR-CON) 20 MEQ packet Take 10-20 mEq by mouth 2 (two) times daily. 1 tab in a.m. And 1/2 tab in p.m.    Historical Provider, MD  Probiotic Product (PRO-FLORA CONCENTRATE) CAPS Take 1 capsule by mouth daily.      Historical Provider, MD   simvastatin (ZOCOR) 40 MG tablet Take 1 tablet (40 mg total) by mouth at bedtime. 07/12/12   Michele Mcalpine, MD  traMADol (ULTRAM) 50 MG tablet Take 1 tablet (50 mg total) by mouth 3 (three) times daily as needed for pain. 10/12/12   Michele Mcalpine, MD  vitamin E 400 UNIT capsule Take 400 Units by mouth daily.      Historical Provider, MD  warfarin (COUMADIN) 2.5 MG tablet Take 1 tablet (2.5 mg total) by mouth as directed. 01/29/13   Wendall Stade, MD    Allergies:  Allergies  Allergen Reactions  . Codeine     REACTION: Nausea/dizziness    History   Social History  . Marital Status: Married    Spouse Name: N/A    Number of Children: 2  . Years of Education: N/A   Occupational History  . Not on file.   Social History Main Topics  . Smoking status: Former Smoker -- 10 years    Types: Cigarettes    Quit date: 09/13/1968  . Smokeless tobacco: Not on file     Comment: only smoked 1 cig per day  . Alcohol Use: Yes     Comment: 1 glass of wine with dinner  . Drug Use: Not on file  . Sexually Active: Not on file   Other Topics Concern  . Not on file   Social History Narrative  . No narrative on file     Family History  Problem Relation Age of Onset  . Stroke Mother   . Cancer Father   . Heart disease Brother     s/p MI    Review of Systems: General: negative for chills, fever, night sweats. Positive for 20 pound weight loss over the past few years. The patient attributes this to decreased appetite. ENT: negative for rhinorrhea or epistaxis Cardiovascular: See history of present illness Dermatological: negative for rash Respiratory: negative for cough or wheezing GI: negative for nausea, vomiting, diarrhea, bright red blood per rectum, melena, or hematemesis. Positive for chronic constipation GU: no hematuria, urgency, or frequency Neurologic: negative for visual changes, syncope, headache, or dizziness Heme: Positive for easy bruising Endo: negative for excessive  thirst, thyroid disorder, or flushing Musculoskeletal: negative for joint pain or swelling, negative for myalgias All other systems reviewed and are otherwise negative except as noted above.  Physical Exam: Blood pressure 147/101, pulse 79, temperature 98.3 F (36.8 C), temperature source Oral, resp. rate 29, SpO2 96.00%. General: Well developed, well nourished, alert and oriented, pleasant elderly woman in no acute distress. HEENT: Normocephalic, atraumatic, sclera non-icteric, no xanthomas, nares are without discharge.  Neck: Supple. Carotids 2+ without bruits. JVP normal Lungs: Bilateral rales and lower lung fields. No wheezing or rhonchi. Breathing is unlabored. Heart: Irregularly irregular with grade 4/6  holosystolic murmur heard throughout Abdomen: Soft, non-tender, non-distended with normoactive bowel sounds. No hepatomegaly. No rebound/guarding. No obvious abdominal masses. Back: No CVA tenderness Msk:  Strength and tone appear normal for age. Extremities: No clubbing, cyanosis, or edema.  Distal pedal pulses are 2+ and equal bilaterally. Neuro: CNII-XII intact, moves all extremities spontaneously. Psych:  Responds to questions appropriately with a normal affect.   Labs:   Lab Results  Component Value Date   WBC 9.4 03/06/2013   HGB 11.3* 03/06/2013   HCT 34.9* 03/06/2013   MCV 89.3 03/06/2013   PLT 203 03/06/2013    Recent Labs Lab 03/06/13 0745  NA 134*  K 4.4  CL 98  CO2 27  BUN 13  CREATININE 0.79  CALCIUM 9.4  GLUCOSE 108*    Recent Labs  03/06/13 0745  TROPONINI <0.30   Lab Results  Component Value Date   CHOL 161 03/10/2012   HDL 46.80 03/10/2012   LDLCALC 89 03/10/2012   TRIG 125.0 03/10/2012   No results found for this basename: DDIMER    Radiology/Studies:  Dg Chest 2 View (if Patient Has Fever And/or Copd)  03/06/2013   *RADIOLOGY REPORT*  Clinical Data: Shortness of breath.  CHEST - 2 VIEW  Comparison: 05/11/2012  Findings: There is chronic  cardiomegaly.  Slight pulmonary vascular congestion with slight interstitial accentuation.  Tiny right effusion.  No significant osseous abnormality.  IMPRESSION: New pulmonary vascular accentuation, at least partially due to shallow inspiration.  However, the patient does have a new small right effusion suggesting mild heart failure.   Original Report Authenticated By: Francene Boyers, M.D.   Mm Digital Screening  03/06/2013   *RADIOLOGY REPORT*  Clinical Data: Screening.  DIGITAL SCREENING BILATERAL MAMMOGRAM WITH CAD  Comparison:  01/19/2006  FINDINGS:  ACR Breast Density Category 2: There are scattered fibroglandular densities.  There is no suspicious dominant mass, architectural distortion, or calcification to suggest malignancy.  Images were processed with CAD.  IMPRESSION: No mammographic evidence of malignancy.  A result letter of this screening mammogram will be mailed directly to the patient.  RECOMMENDATION: Screening mammogram in one year if this is clinically helpful. (Code:SM-B-01Y)  BI-RADS CATEGORY 1:  Negative.   Original Report Authenticated By: Cain Saupe, M.D.     EKG: Atrial fibrillation, PVC, ST-T abnormality consider lateral ischemia.  ASSESSMENT AND PLAN:  1. Acute on chronic diastolic heart failure, likely secondary to severe mitral regurgitation. The patient admits to some salt indiscretion, but overall she follows prudent heart failure restrictions. Labs, radiographic data, and physical exam suggest left-sided heart failure. She does not have evidence of right-sided failure with normal JVP and no peripheral edema. I think she would benefit from hospital admission and continued IV diuresis. Will update her echocardiogram to assure that she has not developed significant LV dysfunction. I wonder if she would be a candidate for a mitral click procedure considering the mechanism of her mitral regurgitation with bileaflet prolapse. This may be something to consider in the future. For  now, will treat with IV diuresis, update her echo, and continue her other home medical therapies.  2. Permanent atrial fibrillation. Continue anticoagulation with warfarin. Her INR is therapeutic. Since no procedures are planned, will continue her Coumadin while she is hospitalized.  3. Advanced directives. Discussed CODE STATUS. She wishes for full code at this point and will think further about this in the future. I suspect she will continue to improve based on her early response to diuretic therapy in  the emergency department.  Enzo Bi  03/06/2013, 10:31 AM

## 2013-03-06 NOTE — Progress Notes (Signed)
Pt's HR running from 39-50's, asymptomatic. Denies any chest pain, not in respiratory distress, no nausea and vomiting. Took off the foley catheter. Dr. Tresa Endo notified. Will continue to monitor pt.

## 2013-03-06 NOTE — ED Notes (Signed)
PT. REPORTS PROGRESSING EXERTIONAL DYSPNEA ONSET THIS MORNING , DENIES COUGH OR CHEST PAIN , SPOUSE STATED HISTORY OF CHF AND MITRAL VALVE PROLAPSE HER CARDIOLOGIST IS [name] .

## 2013-03-07 DIAGNOSIS — I059 Rheumatic mitral valve disease, unspecified: Secondary | ICD-10-CM

## 2013-03-07 DIAGNOSIS — I509 Heart failure, unspecified: Secondary | ICD-10-CM

## 2013-03-07 DIAGNOSIS — Z7901 Long term (current) use of anticoagulants: Secondary | ICD-10-CM

## 2013-03-07 DIAGNOSIS — I4891 Unspecified atrial fibrillation: Secondary | ICD-10-CM

## 2013-03-07 LAB — BASIC METABOLIC PANEL
CO2: 28 mEq/L (ref 19–32)
Chloride: 99 mEq/L (ref 96–112)
GFR calc non Af Amer: 72 mL/min — ABNORMAL LOW (ref >90)
Sodium: 135 mEq/L (ref 135–145)

## 2013-03-07 LAB — PROTIME-INR: INR: 2.77 — ABNORMAL HIGH (ref 0.00–1.49)

## 2013-03-07 MED ORDER — ENSURE COMPLETE PO LIQD
237.0000 mL | ORAL | Status: DC
  Administered 2013-03-08: 237 mL via ORAL

## 2013-03-07 NOTE — Progress Notes (Signed)
Echo Lab  2D Echocardiogram completed.  Emiya Loomer L Desirai Traxler, RDCS 03/07/2013 4:15 PM

## 2013-03-07 NOTE — Care Management Note (Signed)
    Page 1 of 2   03/07/2013     11:46:30 AM   CARE MANAGEMENT NOTE 03/07/2013  Patient:  Erin Rivers, Erin Rivers   Account Number:  [id number]  Date Initiated:  03/07/2013  Documentation initiated by:  [facility]  Subjective/Objective Assessment:   79 y.o. female w/ PMHx significant for permanent atrial fibrillation, diastolic heart failure, and severe mitral regurgitation who presented to [facility] on 03/06/2013 with complaints of shortness of breath.     Action/Plan:   [facility]/ [facility] [place] WITH HOME HEALTH   Anticipated DC Date:  03/10/2013   Anticipated DC Plan:  HOME W HOME HEALTH SERVICES  In-house referral  Clinical Social Worker      DC Planning Services  CM consult      [facility] Choice  HOME HEALTH   Choice offered to / List presented to:  C-1 Patient        HH arranged  HH-1 RN  HH-10 DISEASE MANAGEMENT      HH agency  [facility]   Status of service:  Completed, signed off Medicare Important Message given?   (If response is "NO", the following Medicare IM given date fields will be blank) Date Medicare IM given:   Date Additional Medicare IM given:    Discharge Disposition:    Per UR Regulation:    If discussed at Long Length of Stay Meetings, dates discussed:    Comments:  03/07/13 @1045 ..CAMELLIA WOOD, RN, BSN, [employer] [phone] Spoke with pt and husband at bedside regarding discharge planning for RN home health services.  Pt lives at [facility] with husband- asked if the RNs there would render services. NCM called [facility] to confirm that outside agency is appropriate for Independent Living residents.  Pt and husband chose [facility] to render services.  Kizzie Furnish of [facility] notified.

## 2013-03-07 NOTE — Progress Notes (Signed)
ANTICOAGULATION CONSULT NOTE - Follow Up Consult  Pharmacy Consult for Coumadin Indication: atrial fibrillation  Allergies  Allergen Reactions  . Codeine     REACTION: Nausea/dizziness    Patient Measurements: Height: 5\' 2"  (157.5 cm) Weight: 99 lb 3.3 oz (45 kg) IBW/kg (Calculated) : 50.1 Vital Signs: Temp: 98 F (36.7 C) (06/25 0607) Temp src: Oral (06/25 0607) BP: 122/64 mmHg (06/25 0607) Pulse Rate: 70 (06/25 0607)  Labs:  Recent Labs  03/06/13 0745 03/06/13 0814 03/07/13 0605  HGB 11.3*  --   --   HCT 34.9*  --   --   PLT 203  --   --   LABPROT  --  25.0* 27.9*  INR  --  2.39* 2.77*  CREATININE 0.79  --  0.78  TROPONINI <0.30  --   --     Estimated Creatinine Clearance: 33.9 ml/min (by C-G formula based on Cr of 0.78).  Assessment: 79 YO female with history of atrial fibrillation on chronic Coumadin therapy. INR therapeutic today at 2.77 on home regimen. CBC not repeated today- has been stable. No bleeding reported.   Goal of Therapy:  INR 2-3   Plan:  1. Continue Coumadin at home dose of 2.5mg  Sunday, Tuesday, Thursday and 1.25mg  all other days.  2. Follow-up daily PT/INR.   Link Snuffer, PharmD, BCPS Clinical Pharmacist [phone] 03/07/2013,9:22 AM

## 2013-03-07 NOTE — Progress Notes (Signed)
Patient evaluated for community based chronic disease management services with [facility] Care Management Program as a benefit of patient's [employer]. Spoke with patient, daughter, and spouse at bedside to explain [facility] Care Management services. The daughter is very supportive and they feel that they can self manage.  Patient has a scale and was weighing weekly prior to admission.  Daughter feels that patient has gained fluid weight that was offset by her loss of appetite.  Patient expressed that she has lost weight recently due to increased SOB.  Left contact information and [facility] literature at bedside. Made inpatient Case Manager aware that [facility] Care Management following. Of note, [facility] Care Management services does not replace or interfere with any services that are arranged by inpatient case management or social work.  For additional questions or referrals please contact Anibal Henderson BSN RN [facility] Liaison at [phone].

## 2013-03-07 NOTE — Progress Notes (Signed)
INITIAL NUTRITION ASSESSMENT  DOCUMENTATION CODES Per approved criteria  -Non-severe (moderate) malnutrition in the context of acute illness or injury -Underweight   INTERVENTION: 1. Ensure Complete po daily, each supplement provides 350 kcal and 13 grams of protein.   NUTRITION DIAGNOSIS: Inadequate oral intake related to decreased appetite as evidenced by poor meal completion.   Goal: PO intake to meet >/=90% estimated nutrition needs  Monitor:  PO intake, weight trends, labs, I/O's  Reason for Assessment: Health History  79 y.o. female  Admitting Dx: <principal problem not specified>  ASSESSMENT: Pt admitted with increased SOB and volume overload. Pt given IV lasix and now weight is down 1 lb from admission weight as of this morning. Pt states she typically weighs between 103-105 lbs. Expect that recent weight loss masked by fluid gains. Weight hx shows 6 lb weight loss in the past month (5.7% body weight).  Pt eats only 2 meals per day, drinks a "slimfast" shake in the morning. Discussed nutrition supplements, changing from Slimfast to a more nutritionally dense shake such as Ensure Complete to provide greater calorie and protein intake. Pt agreeable to trying Ensure. Family in room also supportive of change.   Pt meets criteria for moderate malnutrition in the context of acute illness 2/2 weight loss of 5.7% body weight in one month and meeting <75% estimated nutrition needs for >7 days.  Height: Ht Readings from Last 1 Encounters:  03/06/13 5\' 2"  (1.575 m)    Weight: Wt Readings from Last 1 Encounters:  03/07/13 99 lb 3.3 oz (45 kg)    Ideal Body Weight: 110 lbs   % Ideal Body Weight: 90%  Wt Readings from Last 10 Encounters:  03/07/13 99 lb 3.3 oz (45 kg)  01/11/13 105 lb (47.628 kg)  09/20/12 108 lb 6.4 oz (49.17 kg)  03/13/12 107 lb 12.8 oz (48.898 kg)  02/02/12 106 lb 9.6 oz (48.353 kg)  12/06/11 108 lb 12.8 oz (49.351 kg)  10/13/11 111 lb (50.349 kg)   06/09/11 111 lb (50.349 kg)  05/24/11 111 lb 6.4 oz (50.531 kg)  03/10/11 112 lb 9.6 oz (51.075 kg)    Usual Body Weight: 105 lbs   % Usual Body Weight: 94%  BMI:  Body mass index is 18.14 kg/(m^2). Underweight   Estimated Nutritional Needs: Kcal: 1150-1350 Protein: 45-55 gm  Fluid: 1.5 L per fluid restriction  Skin: intact  Diet Order: Cardiac  EDUCATION NEEDS: -No education needs identified at this time   Intake/Output Summary (Last 24 hours) at 03/07/13 1253 Last data filed at 03/07/13 0914  Gross per 24 hour  Intake    663 ml  Output   1000 ml  Net   -337 ml    Last BM: PTA    Labs:   Recent Labs Lab 03/06/13 0745 03/07/13 0605  NA 134* 135  K 4.4 4.2  CL 98 99  CO2 27 28  BUN 13 13  CREATININE 0.79 0.78  CALCIUM 9.4 8.9  GLUCOSE 108* 82    CBG (last 3)  No results found for this basename: GLUCAP,  in the last 72 hours  Scheduled Meds: . digoxin  0.125 mg Oral Daily  . furosemide  40 mg Intravenous BID  . levothyroxine  75 mcg Oral QAC breakfast  . metoprolol  50 mg Oral BID  . potassium chloride  10 mEq Oral QHS  . potassium chloride  20 mEq Oral Daily  . simvastatin  40 mg Oral QHS  . sodium chloride  3 mL Intravenous Q12H  . warfarin  1.25 mg Oral Custom  . warfarin  2.5 mg Oral Custom  . Warfarin - Pharmacist Dosing Inpatient   Does not apply q1800    Continuous Infusions:   Past Medical History  Diagnosis Date  . MVP (mitral valve prolapse)   . Hyperlipidemia   . CHF (congestive heart failure)   . Edema   . Epistaxis   . Dyspnea   . Hypertension   . Osteoporosis   . Anemia   . Hypothyroid   . Atrial fibrillation   . Mitral regurgitation   . Hypercholesteremia   . Pneumonia   . Hearing loss   . Allergic rhinitis   . Chronic low back pain   . Shingles     Past Surgical History  Procedure Laterality Date  . Myringotomies      Clarene Duke RD, LDN Pager [phone] After Hours pager [phone]

## 2013-03-07 NOTE — Progress Notes (Signed)
Patient ID: Erin Rivers, female   DOB: May 17, 1921, 79 y.o.   MRN: [id number]    Subjective:  Less dyspnic  Objective:  Filed Vitals:   03/06/13 1834 03/06/13 2032 03/07/13 0607 03/07/13 1022  BP: 110/61 123/69 122/64   Pulse: 52 68 70 79  Temp: 98.4 F (36.9 C) 97.6 F (36.4 C) 98 F (36.7 C)   TempSrc: Oral Oral Oral   Resp: 19 20 20    Height:      Weight:   99 lb 3.3 oz (45 kg)   SpO2: 92% 100% 92%     Intake/Output from previous day:  Intake/Output Summary (Last 24 hours) at 03/07/13 1128 Last data filed at 03/07/13 0914  Gross per 24 hour  Intake    663 ml  Output   1500 ml  Net   -837 ml    Physical Exam: Affect appropriate Frail elderly female HEENT: normal Neck supple with no adenopathy JVP normal no bruits no thyromegaly Lungs clear with no wheezing and good diaphragmatic motion Heart:  S1/S2 MR murmur, no rub, gallop or click PMI normal Abdomen: benighn, BS positve, no tenderness, no AAA no bruit.  No HSM or HJR Distal pulses intact with no bruits No edema Neuro non-focal Skin warm and dry echymosis on arms from blood draw and iv No muscular weakness   Lab Results: Basic Metabolic Panel:  Recent Labs  40/98/11 0745 03/07/13 0605  NA 134* 135  K 4.4 4.2  CL 98 99  CO2 27 28  GLUCOSE 108* 82  BUN 13 13  CREATININE 0.79 0.78  CALCIUM 9.4 8.9   Liver Function Tests: No results found for this basename: AST, ALT, ALKPHOS, BILITOT, PROT, ALBUMIN,  in the last 72 hours No results found for this basename: LIPASE, AMYLASE,  in the last 72 hours CBC:  Recent Labs  03/06/13 0745  WBC 9.4  HGB 11.3*  HCT 34.9*  MCV 89.3  PLT 203   Cardiac Enzymes:  Recent Labs  03/06/13 0745  TROPONINI <0.30    Imaging: Dg Chest 2 View (if Patient Has Fever And/or Copd)  03/06/2013   *RADIOLOGY REPORT*  Clinical Data: Shortness of breath.  CHEST - 2 VIEW  Comparison: 05/11/2012  Findings: There is chronic cardiomegaly.  Slight pulmonary vascular  congestion with slight interstitial accentuation.  Tiny right effusion.  No significant osseous abnormality.  IMPRESSION: New pulmonary vascular accentuation, at least partially due to shallow inspiration.  However, the patient does have a new small right effusion suggesting mild heart failure.   Original Report Authenticated By: Francene Boyers, M.D.   Mm Digital Screening  03/06/2013   *RADIOLOGY REPORT*  Clinical Data: Screening.  DIGITAL SCREENING BILATERAL MAMMOGRAM WITH CAD  Comparison:  01/19/2006  FINDINGS:  ACR Breast Density Category 2: There are scattered fibroglandular densities.  There is no suspicious dominant mass, architectural distortion, or calcification to suggest malignancy.  Images were processed with CAD.  IMPRESSION: No mammographic evidence of malignancy.  A result letter of this screening mammogram will be mailed directly to the patient.  RECOMMENDATION: Screening mammogram in one year if this is clinically helpful. (Code:SM-B-01Y)  BI-RADS CATEGORY 1:  Negative.   Original Report Authenticated By: Cain Saupe, M.D.    Cardiac Studies:  ECG:  afib rate 80 nonspecific ST T wave changes LVH   Telemetry: afib with good rate control  Echo: pending  Medications:   . digoxin  0.125 mg Oral Daily  . furosemide  40 mg Intravenous  BID  . levothyroxine  75 mcg Oral QAC breakfast  . metoprolol  50 mg Oral BID  . potassium chloride  10 mEq Oral QHS  . potassium chloride  20 mEq Oral Daily  . simvastatin  40 mg Oral QHS  . sodium chloride  3 mL Intravenous Q12H  . warfarin  1.25 mg Oral Custom  . warfarin  2.5 mg Oral Custom  . Warfarin - Pharmacist Dosing Inpatient   Does not apply q1800       Assessment/Plan:  CHF: Continue diuresis Echo 2011 severe MR normal EF Repeat pending Not an operative candidate Afib: permanent INR Rx good rate control with metoprolol and digoxen Digoxen level ok at .7  Chol:  Continue statin Thyroid:  On replacement TSH 2 1/13 will  repeat  Charlton Haws 03/07/2013, 11:28 AM

## 2013-03-07 NOTE — Progress Notes (Signed)
Utilization review completed.  P.J. Calli Bashor,RN,BSN Case Manager [phone]  

## 2013-03-08 LAB — PROTIME-INR
INR: 2.86 — ABNORMAL HIGH (ref 0.00–1.49)
Prothrombin Time: 29 seconds — ABNORMAL HIGH (ref 11.6–15.2)

## 2013-03-08 LAB — DIGOXIN LEVEL: Digoxin Level: 1.4 ng/mL (ref 0.8–2.0)

## 2013-03-08 LAB — BASIC METABOLIC PANEL
CO2: 31 mEq/L (ref 19–32)
Chloride: 98 mEq/L (ref 96–112)
Creatinine, Ser: 0.82 mg/dL (ref 0.50–1.10)

## 2013-03-08 MED ORDER — METOPROLOL TARTRATE 25 MG PO TABS
25.0000 mg | ORAL_TABLET | Freq: Two times a day (BID) | ORAL | Status: DC
  Administered 2013-03-09: 25 mg via ORAL
  Filled 2013-03-08 (×2): qty 1

## 2013-03-08 MED ORDER — WARFARIN 1.25 MG HALF TABLET
1.2500 mg | ORAL_TABLET | Freq: Once | ORAL | Status: AC
  Administered 2013-03-08: 1.25 mg via ORAL
  Filled 2013-03-08: qty 1

## 2013-03-08 MED ORDER — DIGOXIN 0.0625 MG HALF TABLET
0.0625 mg | ORAL_TABLET | Freq: Every day | ORAL | Status: DC
  Administered 2013-03-09: 0.0625 mg via ORAL
  Filled 2013-03-08: qty 1

## 2013-03-08 NOTE — Progress Notes (Signed)
Return call received from [place] informed of pt's HR 39.  Instructed will review it. Will continue to monitor.  Kalah Pflum,RN.

## 2013-03-08 NOTE — Progress Notes (Signed)
Pt continues to be bradycardic esp. When sleeps with no acute distress noted. DR  Nahser notified of bradycardia this AM.

## 2013-03-08 NOTE — Progress Notes (Signed)
ANTICOAGULATION CONSULT NOTE - Follow Up Consult  Pharmacy Consult for Coumadin Indication: atrial fibrillation  Allergies  Allergen Reactions  . Codeine     REACTION: Nausea/dizziness    Patient Measurements: Height: 5\' 2"  (157.5 cm) Weight: 98 lb 12.3 oz (44.8 kg) IBW/kg (Calculated) : 50.1 Vital Signs: Temp: 98.1 F (36.7 C) (06/26 0514) Temp src: Oral (06/26 0514) BP: 116/52 mmHg (06/26 0514) Pulse Rate: 74 (06/26 0514)  Labs:  Recent Labs  03/06/13 0745 03/06/13 0814 03/07/13 0605 03/08/13 0435  HGB 11.3*  --   --   --   HCT 34.9*  --   --   --   PLT 203  --   --   --   LABPROT  --  25.0* 27.9* 29.0*  INR  --  2.39* 2.77* 2.86*  CREATININE 0.79  --  0.78 0.82  TROPONINI <0.30  --   --   --     Estimated Creatinine Clearance: 32.9 ml/min (by C-G formula based on Cr of 0.82).  Assessment: 79 YO female with history of atrial fibrillation on chronic Coumadin therapy. INR therapeutic today at 2.86 and trending up. CBC not repeated today- has been stable. No bleeding reported.   Goal of Therapy:  INR 2-3   Plan:  1. Coumadin 1.25mg  today 2. Follow-up daily PT/INR.   Talbert Cage, PharmD Clinical Pharmacist [phone] 03/08/2013,8:26 AM

## 2013-03-08 NOTE — Progress Notes (Signed)
Pt's HR went down to 39.  Asymptomatic.  BP102/65,  P66.  Tony,PA paged.   Will continue to monitor.  Amanda Pea, RN

## 2013-03-08 NOTE — Progress Notes (Signed)
Patient Name: Erin Rivers Date of Encounter: 03/08/2013     Principal Problem:   Acute on chronic diastolic heart failure Active Problems:   HYPOTHYROIDISM   MITRAL REGURGITATION   ATRIAL FIBRILLATION, CHRONIC   HYPERCHOLESTEROLEMIA    SUBJECTIVE: Feels well this AM. Shortness of breath improved.    OBJECTIVE  Filed Vitals:   03/07/13 2025 03/08/13 0514 03/08/13 1008 03/08/13 1100  BP: 133/62 116/52 102/63 102/65  Pulse: 64 74 79 66  Temp: 98.3 F (36.8 C) 98.1 F (36.7 C)    TempSrc: Oral Oral    Resp: 20 20    Height:      Weight:  44.8 kg (98 lb 12.3 oz)    SpO2: 96% 94%      Intake/Output Summary (Last 24 hours) at 03/08/13 1247 Last data filed at 03/08/13 0817  Gross per 24 hour  Intake    900 ml  Output   1225 ml  Net   -325 ml   Weight change: -0.7 kg (-1 lb 8.7 oz)  PHYSICAL EXAM  General: Elderly appearing female in NAD.  Head: Normocephalic, atraumatic, sclera non-icteric, no xanthomas, nares are without discharge.  Neck: Supple without bruits or JVD. Lungs:  Resp regular and unlabored, CTAB without wheezes, rales or rhonchi Heart: Irregularly irregular, sysolic murmur at apex radiating to axilla, short soft diastolic murmur at LUSB/LLSB, no s3, s4 Abdomen: Soft, non-tender, non-distended, BS + x 4.  Msk:  Strength and tone appears normal for age. Extremities: No clubbing, cyanosis or edema. DP/PT/Radials 2+ and equal bilaterally. Neuro: Alert and oriented X 3. Moves all extremities spontaneously. Psych: Normal affect.  LABS:  Recent Labs     03/06/13  0745  WBC  9.4  HGB  11.3*  HCT  34.9*  MCV  89.3  PLT  203    Recent Labs Lab 03/06/13 0745 03/07/13 0605 03/08/13 0435  NA 134* 135 134*  K 4.4 4.2 4.2  CL 98 99 98  CO2 27 28 31   BUN 13 13 14   CREATININE 0.79 0.78 0.82  CALCIUM 9.4 8.9 9.0  GLUCOSE 108* 82 86   Recent Labs     03/06/13  0745  TROPONINI  <0.30    Recent Labs  03/08/13 0435  TSH 3.907      TELE: atrial fibrillation, rate 60-70 bpm, one episode of slow VR at 39 bpm, < 2 sec pauses x 3 noted during this interval   Radiology/Studies:  Dg Chest 2 View (if Patient Has Fever And/or Copd)  03/06/2013   *RADIOLOGY REPORT*  Clinical Data: Shortness of breath.  CHEST - 2 VIEW  Comparison: 05/11/2012  Findings: There is chronic cardiomegaly.  Slight pulmonary vascular congestion with slight interstitial accentuation.  Tiny right effusion.  No significant osseous abnormality.  IMPRESSION: New pulmonary vascular accentuation, at least partially due to shallow inspiration.  However, the patient does have a new small right effusion suggesting mild heart failure.   Original Report Authenticated By: Francene Boyers, M.D.   Mm Digital Screening  03/06/2013   *RADIOLOGY REPORT*  Clinical Data: Screening.  DIGITAL SCREENING BILATERAL MAMMOGRAM WITH CAD  Comparison:  01/19/2006  FINDINGS:  ACR Breast Density Category 2: There are scattered fibroglandular densities.  There is no suspicious dominant mass, architectural distortion, or calcification to suggest malignancy.  Images were processed with CAD.  IMPRESSION: No mammographic evidence of malignancy.  A result letter of this screening mammogram will be mailed directly to the patient.  RECOMMENDATION: Screening  mammogram in one year if this is clinically helpful. (Code:SM-B-01Y)  BI-RADS CATEGORY 1:  Negative.   Original Report Authenticated By: Cain Saupe, M.D.    Current Medications:  . [START ON 03/09/2013] digoxin  0.0625 mg Oral Daily  . feeding supplement  237 mL Oral Q24H  . furosemide  40 mg Intravenous BID  . levothyroxine  75 mcg Oral QAC breakfast  . [START ON 03/09/2013] metoprolol  25 mg Oral BID  . potassium chloride  10 mEq Oral QHS  . potassium chloride  20 mEq Oral Daily  . simvastatin  40 mg Oral QHS  . sodium chloride  3 mL Intravenous Q12H  . warfarin  1.25 mg Oral ONCE-1800  . Warfarin - Pharmacist Dosing Inpatient    Does not apply q1800    ASSESSMENT AND PLAN:  1. Acute on chronic diastolic CHF- weight 98 lbs today. Actually 105 lbs on follow-up last month. No noted CHF symptoms at that time. Suspect decompensation with underlying severe MR. Continued  Lasix 40mg  IV BID. Total I/O: -1162 mL. 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   - Transition to home PO Lasix regimen  2. Severe MR- murmur appreciated on exam. Confirmed on echo yesterday with associated cardiac structural changes. Considering mitral clip procedure at [facility].   3. Permanent atrial fibrillation- fairly well rate-controlled. She did have an episode of slow VR today (39 bpm). She as asymptomatic with this.  Dig level trended up to 1.3 yesterday which may be a bit high given her weight. INR therapeutic.   - Reduce digoxin to 0.0625 mg daily  - Reduce metoprolol to 25mg  PO BID. Will hold PM dose and start this tomorrow.   - Coumadin per pharmacy  4. HLD  - Continue statin  5. Hypothyroidism- TSH 3.907 yesterday.   - Continue Synthroid     Signed, R. Hurman Horn, PA-C 03/08/2013, 12:47 PM  Patient seen on rounds earlier this am  Afib rates have been slow.  Decrease digoxen and beta blockers.  She is still on the fence about pursuing MV clip at [facility].  Dyspnea improved.  Echo with normal EF still and severe MR  Charlton Haws

## 2013-03-09 DIAGNOSIS — E440 Moderate protein-calorie malnutrition: Secondary | ICD-10-CM | POA: Insufficient documentation

## 2013-03-09 LAB — BASIC METABOLIC PANEL
CO2: 28 mEq/L (ref 19–32)
Calcium: 9.4 mg/dL (ref 8.4–10.5)
GFR calc non Af Amer: 73 mL/min — ABNORMAL LOW (ref >90)
Sodium: 135 mEq/L (ref 135–145)

## 2013-03-09 LAB — PROTIME-INR
INR: 2.55 — ABNORMAL HIGH (ref 0.00–1.49)
Prothrombin Time: 26.6 seconds — ABNORMAL HIGH (ref 11.6–15.2)

## 2013-03-09 MED ORDER — METOPROLOL TARTRATE 25 MG PO TABS: 12.5000 mg | ORAL_TABLET | Freq: Two times a day (BID) | ORAL | Status: DC

## 2013-03-09 MED ORDER — DIGOXIN 125 MCG PO TABS: 0.0625 mg | ORAL_TABLET | Freq: Every day | ORAL | Status: AC

## 2013-03-09 MED ORDER — WARFARIN 1.25 MG HALF TABLET
1.2500 mg | ORAL_TABLET | Freq: Once | ORAL | Status: DC
  Filled 2013-03-09: qty 1

## 2013-03-09 MED ORDER — POTASSIUM CHLORIDE 20 MEQ PO PACK: 20.0000 meq | PACK | Freq: Every day | ORAL | Status: DC

## 2013-03-09 MED ORDER — FUROSEMIDE 40 MG PO TABS: 40.0000 mg | ORAL_TABLET | Freq: Two times a day (BID) | ORAL | Status: DC

## 2013-03-09 MED ORDER — SIMVASTATIN 40 MG PO TABS: 40.0000 mg | ORAL_TABLET | Freq: Every day | ORAL | Status: DC

## 2013-03-09 MED ORDER — ENSURE COMPLETE PO LIQD: 237.0000 mL | ORAL | Status: DC

## 2013-03-09 NOTE — Discharge Summary (Signed)
CARDIOLOGY DISCHARGE SUMMARY   Patient ID: Erin Rivers MRN: [id number] DOB/AGE: 05-19-1921 79 y.o.  Admit date: 03/06/2013 Discharge date: 03/09/2013  Primary Discharge Diagnosis:    Acute on chronic diastolic heart failure Secondary Discharge Diagnosis:    HYPOTHYROIDISM   MITRAL REGURGITATION   ATRIAL FIBRILLATION, CHRONIC   HYPERCHOLESTEROLEMIA   Malnutrition of moderate degree  Procedures: 2-D echocardiogram  Hospital Course: Erin Rivers is a 79 y.o. female with a history of CHF. She had increasing shortness of breath as well as orthopnea and PND. She came to the hospital and was admitted for further evaluation and treatment.  She received IV Lasix in the emergency room with improvement in her breathing. IV Lasix was continued, she lost about 2 L during her hospital stay. Her weight decreased slightly but her breathing greatly improved. By discharge, her oxygen saturation was 93% on room air.  She was in atrial fibrillation which is permanent. She was noted to have slow heart rates at times in the 30s, frequently during sleep but was asymptomatic with this. A dig level was high normal at 1.4. Her beta blocker was decreased and her digoxin level was decreased as well. She is to report any symptoms such as dizziness or presyncope.  She has a history of hypothyroidism and was on Synthroid prior to admission. A TSH was within normal limits and no dose changes were made.  She had been on Coumadin prior to admission and was therapeutic. This was continued and she is to followup as scheduled with the Coumadin clinic.  She is also might recently in her body mass index is 18.1. Her by mouth intake is not consistently good. She is encouraged to do an Ensure or other meal supplement as a snack daily for nutritional support and eat heart healthy meals.  By 03/09/2013, her condition was greatly improved. She was evaluated by [name] and was feeling well. [name] felt Erin Rivers was  stable for discharge, in improved condition, to follow up as an outpatient.  Labs:  Lab Results  Component Value Date   WBC 9.4 03/06/2013   HGB 11.3* 03/06/2013   HCT 34.9* 03/06/2013   MCV 89.3 03/06/2013   PLT 203 03/06/2013     Recent Labs Lab 03/09/13 0450  NA 135  K 4.1  CL 97  CO2 28  BUN 15  CREATININE 0.74  CALCIUM 9.4  GLUCOSE 85   Pro B Natriuretic peptide (BNP)  Date/Time Value Range Status  03/06/2013  7:45 AM 2904.0* 0 - 450 pg/mL Final  03/10/2012  9:31 AM 584.0* 0.0 - 100.0 pg/mL Final    Recent Labs  03/09/13 0450  INR 2.55*   Lab Results  Component Value Date   TSH 3.907 03/08/2013   Lab Results  Component Value Date   DIGOXIN 1.4 03/08/2013     Radiology: Dg Chest 2 View (if Patient Has Fever And/or Copd) 03/06/2013   *RADIOLOGY REPORT*  Clinical Data: Shortness of breath.  CHEST - 2 VIEW  Comparison: 05/11/2012  Findings: There is chronic cardiomegaly.  Slight pulmonary vascular congestion with slight interstitial accentuation.  Tiny right effusion.  No significant osseous abnormality.  IMPRESSION: New pulmonary vascular accentuation, at least partially due to shallow inspiration.  However, the patient does have a new small right effusion suggesting mild heart failure.   Original Report Authenticated By: Francene Boyers, M.D.    EKG: Atrial fibrillation, rate 80, PVCs, lateral ST and T wave changes  Echo: 03/07/2013 Study  Conclusions - Left ventricle: The cavity size was normal. Wall thickness was normal. The estimated ejection fraction was 55%. - Aortic valve: Mild regurgitation. 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 - Left atrium: The atrium was severely dilated. - Right atrium: The atrium was moderately dilated. - Atrial septum: No defect or patent foramen ovale was identified. - Tricuspid valve: Moderate regurgitation. - Pulmonary arteries: PA peak pressure: 66mm Hg (S).  FOLLOW UP PLANS AND  APPOINTMENTS Allergies  Allergen Reactions  . Codeine     REACTION: Nausea/dizziness     Medication List    TAKE these medications       aspirin 81 MG tablet  Take 81 mg by mouth daily.     azithromycin 250 MG tablet  Commonly known as:  ZITHROMAX  Prn for dental     CALTRATE 600+D 600-400 MG-UNIT per tablet  Generic drug:  Calcium Carbonate-Vitamin D  Take 1 tablet by mouth daily.     digoxin 0.125 MG tablet  Commonly known as:  LANOXIN  Take 0.5 tablets (0.0625 mg total) by mouth daily.     feeding supplement Liqd  Take 237 mLs by mouth daily.     furosemide 40 MG tablet  Commonly known as:  LASIX  Take 1 tablet (40 mg total) by mouth 2 (two) times daily. Takes one and one-half tablets (40mg  tablets = total 60mg ) in the morning     ibandronate 150 MG tablet  Commonly known as:  BONIVA  Take 150 mg by mouth every 30 (thirty) days. Take in the morning with a full glass of water, on an empty stomach, and do not take anything else by mouth or lie down for the next 30 min.     levothyroxine 75 MCG tablet  Commonly known as:  SYNTHROID, LEVOTHROID  Take 75 mcg by mouth daily before breakfast.     metoprolol tartrate 25 MG tablet  Commonly known as:  LOPRESSOR  Take 0.5 tablets (12.5 mg total) by mouth 2 (two) times daily.     potassium chloride 20 MEQ packet  Commonly known as:  KLOR-CON  Take 20 mEq by mouth daily. 1 tab in a.m. And 1/2 tab in p.m.     PRO-FLORA CONCENTRATE Caps  Take 1 capsule by mouth daily.     simvastatin 40 MG tablet  Commonly known as:  ZOCOR  Take 1 tablet (40 mg total) by mouth at bedtime.     traMADol 50 MG tablet  Commonly known as:  ULTRAM  Take 1 tablet (50 mg total) by mouth 3 (three) times daily as needed for pain.     vitamin C 500 MG tablet  Commonly known as:  ASCORBIC ACID  Take 500 mg by mouth daily.     Vitamin D 1000 UNITS capsule  Take 1,000 Units by mouth daily.     vitamin E 400 UNIT capsule  Take 400 Units by  mouth daily.     warfarin 2.5 MG tablet  Commonly known as:  COUMADIN  Take 1.25-2.5 mg by mouth at bedtime. Takes 1/2 tablet (1.25mg ) on Monday, Wednesday, Friday, and Saturday and 1 tablet (2.5mg ) on Sunday, Tuesday, and Thursday. Seen at [facility].        Discharge Orders   Future Appointments Provider Department Dept Phone   03/14/2013 11:30 AM [facility] [employer] Main Office [place]) [phone]   03/21/2013 10:30 AM Michele Mcalpine, MD [facility] [phone]   03/28/2013  3:20 PM Beatrice Lecher, PA-C [facility] Main Office [place]) [phone]   04/13/2013 11:15 AM [facility] [phone]   Future Orders Complete By Expires     (HEART FAILURE PATIENTS) Call MD:  Anytime you have any of the following symptoms: 1) 3 pound weight gain in 24 hours or 5 pounds in 1 week 2) shortness of breath, with or without a dry hacking cough 3) swelling in the hands, feet or stomach 4) if you have to sleep on extra pillows at night in order to breathe.  As directed     Diet - low sodium heart healthy  As directed     Increase activity slowly  As directed       Follow-up Information   Follow up with Tereso Newcomer, PA-C On 03/28/2013. (See for Dr Eden Emms at 3:20 pm)    Contact information:   [address] [place] [address] [phone]       BRING ALL MEDICATIONS WITH YOU TO FOLLOW UP APPOINTMENTS  Time spent with patient to include physician time: 39 min Signed: Theodore Demark, PA-C 03/09/2013, 3:20 PM Co-Sign MD

## 2013-03-09 NOTE — Progress Notes (Signed)
Pt. Alert and oriented this am. No s/s of distress or discomfort noted. No c/o pain. Pt. Rested well during the night. VSS. Pt. Denies pain. RN will continue to monitor pt. For changes in condition. Ramondo Dietze, Cheryll Dessert

## 2013-03-09 NOTE — Progress Notes (Signed)
Discussed discharge instructions with pt including how and when to call the dr, follow up appts, diet, activity, and medications to take at home. Pt verbalized understanding. Spouse at bedside and verbalized understanding.  Juliane Lack, RN

## 2013-03-09 NOTE — Progress Notes (Signed)
ANTICOAGULATION CONSULT NOTE - Follow Up Consult  Pharmacy Consult for Coumadin Indication: atrial fibrillation  Allergies  Allergen Reactions  . Codeine     REACTION: Nausea/dizziness    Patient Measurements: Height: 5\' 2"  (157.5 cm) Weight: 98 lb (44.453 kg) IBW/kg (Calculated) : 50.1 Vital Signs: Temp: 98.1 F (36.7 C) (06/27 0837) Temp src: Oral (06/27 0837) BP: 119/62 mmHg (06/27 0837) Pulse Rate: 76 (06/27 0837)  Labs:  Recent Labs  03/07/13 0605 03/08/13 0435 03/09/13 0450  LABPROT 27.9* 29.0* 26.6*  INR 2.77* 2.86* 2.55*  CREATININE 0.78 0.82 0.74    Estimated Creatinine Clearance: 33.5 ml/min (by C-G formula based on Cr of 0.74).  Assessment: 79 YO female with history of atrial fibrillation on chronic Coumadin therapy. INR therapeutic. CBC not repeated today- has been stable. No bleeding reported.   Goal of Therapy:  INR 2-3   Plan:  1. Coumadin 1.25mg  today - recommend sending home on previous regimen. 2. Follow-up daily PT/INR.   Link Snuffer, PharmD, BCPS Clinical Pharmacist [phone]  03/09/2013,9:24 AM

## 2013-03-09 NOTE — Progress Notes (Signed)
Pt HR dropping to 30's non-sustained. HR mainly in 50s. Pt asymptomatic. Notified NP on call. No new orders given. Pt will be discharged shortly. Will continue to monitor pt closely.  Juliane Lack, RN

## 2013-03-09 NOTE — Progress Notes (Signed)
Patient ID: EDYTH GLOMB, female   DOB: May 21, 1921, 79 y.o.   MRN: [id number]   Patient Name: Erin Rivers Date of Encounter: 03/09/2013     Principal Problem:   Acute on chronic diastolic heart failure Active Problems:   HYPOTHYROIDISM   MITRAL REGURGITATION   ATRIAL FIBRILLATION, CHRONIC   HYPERCHOLESTEROLEMIA   Malnutrition of moderate degree    SUBJECTIVE: Feels well this AM. Shortness of breath improved. Lives at [facility] and ready for discharge   OBJECTIVE  Filed Vitals:   03/08/13 1347 03/08/13 2038 03/09/13 0609 03/09/13 0837  BP: 99/48 110/59 116/62 119/62  Pulse: 53 63 55 76  Temp: 98.8 F (37.1 C) 98.3 F (36.8 C) 98.7 F (37.1 C) 98.1 F (36.7 C)  TempSrc: Oral Oral Oral Oral  Resp: 20 19 18 18   Height:      Weight:   98 lb (44.453 kg)   SpO2: 94% 96% 92% 93%    Intake/Output Summary (Last 24 hours) at 03/09/13 0855 Last data filed at 03/09/13 4098  Gross per 24 hour  Intake    660 ml  Output    880 ml  Net   -220 ml   Weight change: -12.3 oz (-0.348 kg)  PHYSICAL EXAM  General: Elderly appearing female in NAD.  Head: Normocephalic, atraumatic, sclera non-icteric, no xanthomas, nares are without discharge.  Neck: Supple without bruits or JVD. Lungs:  Resp regular and unlabored, CTAB without wheezes, rales or rhonchi Heart: Irregularly irregular, sysolic murmur at apex radiating to axilla, short soft diastolic murmur at LUSB/LLSB, no s3, s4 Abdomen: Soft, non-tender, non-distended, BS + x 4.  Msk:  Strength and tone appears normal for age. Extremities: No clubbing, cyanosis or edema. DP/PT/Radials 2+ and equal bilaterally. Neuro: Alert and oriented X 3. Moves all extremities spontaneously. Psych: Normal affect.  LABS:  No results found for this basename: WBC, HGB, HCT, MCV, PLT,  in the last 72 hours  Recent Labs Lab 03/07/13 0605 03/08/13 0435 03/09/13 0450  NA 135 134* 135  K 4.2 4.2 4.1  CL 99 98 97  CO2 28 31 28   BUN 13 14 15     CREATININE 0.78 0.82 0.74  CALCIUM 8.9 9.0 9.4  GLUCOSE 82 86 85   No results found for this basename: CKTOTAL, CKMB, CKMBINDEX, TROPONINI,  in the last 72 hours  Recent Labs  03/08/13 0435  TSH 3.907     TELE: atrial fibrillation, rate 60-70 bpm, one episode of slow VR at 39 bpm, < 2 sec pauses x 3 noted during this interval   Radiology/Studies:  Dg Chest 2 View (if Patient Has Fever And/or Copd)  03/06/2013   *RADIOLOGY REPORT*  Clinical Data: Shortness of breath.  CHEST - 2 VIEW  Comparison: 05/11/2012  Findings: There is chronic cardiomegaly.  Slight pulmonary vascular congestion with slight interstitial accentuation.  Tiny right effusion.  No significant osseous abnormality.  IMPRESSION: New pulmonary vascular accentuation, at least partially due to shallow inspiration.  However, the patient does have a new small right effusion suggesting mild heart failure.   Original Report Authenticated By: Francene Boyers, M.D.   Mm Digital Screening  03/06/2013   *RADIOLOGY REPORT*  Clinical Data: Screening.  DIGITAL SCREENING BILATERAL MAMMOGRAM WITH CAD  Comparison:  01/19/2006  FINDINGS:  ACR Breast Density Category 2: There are scattered fibroglandular densities.  There is no suspicious dominant mass, architectural distortion, or calcification to suggest malignancy.  Images were processed with CAD.  IMPRESSION: No mammographic  evidence of malignancy.  A result letter of this screening mammogram will be mailed directly to the patient.  RECOMMENDATION: Screening mammogram in one year if this is clinically helpful. (Code:SM-B-01Y)  BI-RADS CATEGORY 1:  Negative.   Original Report Authenticated By: Cain Saupe, M.D.    Current Medications:  . digoxin  0.0625 mg Oral Daily  . feeding supplement  237 mL Oral Q24H  . furosemide  40 mg Intravenous BID  . levothyroxine  75 mcg Oral QAC breakfast  . metoprolol  25 mg Oral BID  . potassium chloride  10 mEq Oral QHS  . potassium chloride  20 mEq  Oral Daily  . simvastatin  40 mg Oral QHS  . sodium chloride  3 mL Intravenous Q12H  . Warfarin - Pharmacist Dosing Inpatient   Does not apply q1800    ASSESSMENT AND PLAN:  1. Acute on chronic diastolic CHF- weight 98 lbs today. Actually 105 lbs on follow-up last month. No noted CHF symptoms at that time. Suspect decompensation with underlying severe MR. Continued  Lasix 40mg  IV BID. Total I/O: -1162 mL. 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   -  Home lasix will be 40 bid  2. Severe MR- murmur appreciated on exam. Confirmed on echo yesterday with associated cardiac structural changes. Considering mitral clip procedure at [facility].  Will copy echo and arrange outpatient visit with Dr Modesto Charon at [facility]  3. Permanent atrial fibrillation- fairly well rate-controlled. She did have an episode of slow VR today (39 bpm). She as asymptomatic with this.  Dig level trended up to 1.3 yesterday which may be a bit high given her weight. INR therapeutic.   - Reduce digoxin to 0.0625 mg daily  - Reduce metoprolol to 12.5 mg PO BID. Will hold PM dose and start this tomorrow.   - Coumadin f/u clinic   4. HLD  - Continue statin  5. Hypothyroidism- TSH 3.907 yesterday.   - Continue Synthroid   Meds changes  Lasix 40 bid KCL 20  Lopressor 12.5 bid Digoxen .1610 daily  Charlton Haws

## 2013-03-12 ENCOUNTER — Telehealth: Payer: Self-pay | Admitting: *Deleted

## 2013-03-12 NOTE — Telephone Encounter (Signed)
More Detail >>      Wendall Stade, MD      Sent: Mon March 12, 2013  9:11 AM    To: Alois Cliche, LPN          Message    Does she have an appt with Dr Modesto Charon at [facility] for MV clip yet?          ----- Message -----       From: Wendall Stade, MD       Sent: 03/12/2013   9:10 AM         To: Wendall Stade, MD, Michele Mcalpine, MD                      Attached Notes      Author: Darrol Jump, PA-C Service: Cardiology Author Type: PHYSICIAN ASSISTANT CERTIFIED    Filed: 03/09/2013  3:20 PM Note Time: 03/09/2013  9:44 AM Note Type: Discharge Summaries    Cosigner: Wendall Stade, MD at 03/12/2013  9:10 AM                       CARDIOLOGY DISCHARGE SUMMARY      Patient ID: Erin Rivers MRN: [id number] DOB/AGE: 05-12-1921 79 y.o.   Admit date: 03/06/2013 Discharge date: 03/09/2013   Primary Discharge Diagnosis:    Acute on chronic diastolic heart failure Secondary Discharge Diagnosis:    HYPOTHYROIDISM   MITRAL REGURGITATION   ATRIAL FIBRILLATION, CHRONIC   HYPERCHOLESTEROLEMIA   Malnutrition of moderate degree   Procedures: 2-D echocardiogram   Hospital Course: Erin Rivers is a 79 y.o. female with a history of CHF. She had increasing shortness of breath as well as orthopnea and PND. She came to the hospital and was admitted for further evaluation and treatment.   She received IV Lasix in the emergency room with improvement in her breathing. IV Lasix was continued, she lost about 2 L during her hospital stay. Her weight decreased slightly but her breathing greatly improved. By discharge, her oxygen saturation was 93% on room air.   She was in atrial fibrillation which is permanent. She was noted to have slow heart rates at times in the 30s, frequently during sleep but was asymptomatic with this. A dig level was high normal at 1.4. Her beta blocker was decreased and her digoxin level was decreased as well. She is to report any symptoms such as dizziness or  presyncope.   She has a history of hypothyroidism and was on Synthroid prior to admission. A TSH was within normal limits and no dose changes were made.   She had been on Coumadin prior to admission and was therapeutic. This was continued and she is to followup as scheduled with the Coumadin clinic.   She is also might recently in her body mass index is 18.1. Her by mouth intake is not consistently good. She is encouraged to do an Ensure or other meal supplement as a snack daily for nutritional support and eat heart healthy meals.   By 03/09/2013, her condition was greatly improved. She was evaluated by [name] and was feeling well. [name] felt Erin Rivers was stable for discharge, in improved condition, to follow up as an outpatient.   Labs:   Lab Results   Component  Value  Date     WBC  9.4  03/06/2013     HGB  11.3*  03/06/2013  HCT  34.9*  03/06/2013     MCV  89.3  03/06/2013     PLT  203  03/06/2013        Recent Labs Lab  03/09/13 0450   NA  135   K  4.1   CL  97   CO2  28   BUN  15   CREATININE  0.74   CALCIUM  9.4   GLUCOSE  85       Pro B Natriuretic peptide (BNP)   Date/Time  Value  Range  Status   03/06/2013  7:45 AM  2904.0*  0 - 450 pg/mL  Final   03/10/2012  9:31 AM  584.0*  0.0 - 100.0 pg/mL  Final        Recent Labs   03/09/13 0450   INR  2.55*       Lab Results   Component  Value  Date     TSH  3.907  03/08/2013       Lab Results   Component  Value  Date     DIGOXIN  1.4  03/08/2013       Radiology: Dg Chest 2 View (if Patient Has Fever And/or Copd) 03/06/2013   *RADIOLOGY REPORT*  Clinical Data: Shortness of breath.  CHEST - 2 VIEW  Comparison: 05/11/2012  Findings: There is chronic cardiomegaly.  Slight pulmonary vascular congestion with slight interstitial accentuation.  Tiny right effusion.  No significant osseous abnormality.  IMPRESSION: New pulmonary vascular accentuation, at least partially due to shallow inspiration.  However,  the patient does have a new small right effusion suggesting mild heart failure.   Original Report Authenticated By: Francene Boyers, M.D.      EKG: Atrial fibrillation, rate 80, PVCs, lateral ST and T wave changes   Echo: 03/07/2013 Study Conclusions - Left ventricle: The cavity size was normal. Wall thickness was normal. The estimated ejection fraction was 55%. - Aortic valve: Mild regurgitation. 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 - Left atrium: The atrium was severely dilated. - Right atrium: The atrium was moderately dilated. - Atrial septum: No defect or patent foramen ovale was identified. - Tricuspid valve: Moderate regurgitation. - Pulmonary arteries: PA peak pressure: 66mm Hg (S).   FOLLOW UP PLANS AND APPOINTMENTS Allergies   Allergen  Reactions   .  Codeine         REACTION: Nausea/dizziness          Medication List      TAKE these medications           aspirin 81 MG tablet   Take 81 mg by mouth daily.         azithromycin 250 MG tablet   Commonly known as:  ZITHROMAX   Prn for dental         CALTRATE 600+D 600-400 MG-UNIT per tablet   Generic drug:  Calcium Carbonate-Vitamin D   Take 1 tablet by mouth daily.         digoxin 0.125 MG tablet   Commonly known as:  LANOXIN   Take 0.5 tablets (0.0625 mg total) by mouth daily.         feeding supplement Liqd   Take 237 mLs by mouth daily.         furosemide 40 MG tablet   Commonly known as:  LASIX   Take 1 tablet (40 mg total) by mouth 2 (two) times daily. Takes one and one-half tablets (40mg  tablets =  total 60mg ) in the morning         ibandronate 150 MG tablet   Commonly known as:  BONIVA   Take 150 mg by mouth every 30 (thirty) days. Take in the morning with a full glass of water, on an empty stomach, and do not take anything else by mouth or lie down for the next 30 min.         levothyroxine 75 MCG tablet   Commonly known as:  SYNTHROID,  LEVOTHROID   Take 75 mcg by mouth daily before breakfast.         metoprolol tartrate 25 MG tablet   Commonly known as:  LOPRESSOR   Take 0.5 tablets (12.5 mg total) by mouth 2 (two) times daily.         potassium chloride 20 MEQ packet   Commonly known as:  KLOR-CON   Take 20 mEq by mouth daily. 1 tab in a.m. And 1/2 tab in p.m.         PRO-FLORA CONCENTRATE Caps   Take 1 capsule by mouth daily.         simvastatin 40 MG tablet   Commonly known as:  ZOCOR   Take 1 tablet (40 mg total) by mouth at bedtime.         traMADol 50 MG tablet   Commonly known as:  ULTRAM   Take 1 tablet (50 mg total) by mouth 3 (three) times daily as needed for pain.         vitamin C 500 MG tablet   Commonly known as:  ASCORBIC ACID   Take 500 mg by mouth daily.         Vitamin D 1000 UNITS capsule   Take 1,000 Units by mouth daily.         vitamin E 400 UNIT capsule   Take 400 Units by mouth daily.         warfarin 2.5 MG tablet   Commonly known as:  COUMADIN   Take 1.25-2.5 mg by mouth at bedtime. Takes 1/2 tablet (1.25mg ) on Monday, Wednesday, Friday, and Saturday and 1 tablet (2.5mg ) on Sunday, Tuesday, and Thursday. Seen at [facility].                Discharge Orders     Future Appointments  Provider  Department  Dept Phone     03/14/2013 11:30 AM  [facility]  [facility] Main Office [place])  [phone]     03/21/2013 10:30 AM  Michele Mcalpine, MD  [facility]  [phone]     03/28/2013 3:20 PM  Beatrice Lecher, PA-C  [facility] Main Office [place])  [phone]     04/13/2013 11:15 AM  [facility]  [facility]  [phone]     Future Orders  Complete By  Expires        (HEART FAILURE PATIENTS) Call MD:  Anytime you have any of the following symptoms: 1) 3 pound weight gain in 24 hours or 5 pounds in 1 week 2) shortness of breath, with or without a dry hacking cough 3) swelling in the hands, feet or  stomach 4) if you have to sleep on extra pillows at night in order to breathe.   As directed         Diet - low sodium heart healthy   As directed         Increase activity slowly   As directed  Follow-up Information     Follow up with Tereso Newcomer, PA-C On 03/28/2013. (See for Dr Eden Emms at 3:20 pm)      Contact information:     [address] [place] [address] [phone]            BRING ALL MEDICATIONS WITH YOU TO FOLLOW UP APPOINTMENTS   Time spent with patient to include physician time: 39 min Signed: Theodore Demark, PA-C 03/09/2013, 3:20 PM Co-Sign MD                             From: Wendall Stade              Sent: Monday March 12, 2013           Does she have an appt with Dr Modesto Charon at [facility] for MV clip yet?   ----- Message -----    From: Wendall Stade, MD    Sent: 03/12/2013   9:10 AM      To: Wendall Stade, MD, Michele Mcalpine, MD                     Does she have an appt with Dr Modesto Charon at [facility] for MV clip yet?       ----- Message -----         From: Wendall Stade         Sent: 03/12/2013   9:10 AM           To: Wendall Stade, MD, Michele Mcalpine, MD       Rejection Reason:          SPOKE WITH PT  HAS NOT HEARD FROM  DR [facility] OFFICE   CALL PLACED SPOKE WITH ROXANNE  HAS NO INFO ON PT . OFFICE NOTE AND ECHO FAXED  WITH DEMO SHEET  [facility] TO CALL PT WITH AN APPT .Zack Seal

## 2013-03-14 ENCOUNTER — Ambulatory Visit (INDEPENDENT_AMBULATORY_CARE_PROVIDER_SITE_OTHER): Payer: Medicare Other | Admitting: Cardiology

## 2013-03-14 ENCOUNTER — Other Ambulatory Visit: Payer: Medicare Other

## 2013-03-14 DIAGNOSIS — I4891 Unspecified atrial fibrillation: Secondary | ICD-10-CM

## 2013-03-14 DIAGNOSIS — Z7901 Long term (current) use of anticoagulants: Secondary | ICD-10-CM

## 2013-03-15 ENCOUNTER — Other Ambulatory Visit: Payer: Self-pay | Admitting: Pulmonary Disease

## 2013-03-19 ENCOUNTER — Telehealth: Payer: Self-pay | Admitting: Pulmonary Disease

## 2013-03-19 NOTE — Telephone Encounter (Signed)
Pt was last seen by SN on 09/20/12 with the following instructions:  Patient Instructions    Today we updated your med list in our EPIC system...  Continue your current medications the same...  Keep up the good work & stay as active as poss...  Call for any problems or if we can be of service in any way...  Let's plan a follow up visit in 6 months w/ FASTING blood work at that time...   -------  Called, spoke with pt's spouse.  Pt has a pending OV with SN on Wednesday, July 9.  She was in [facility] from 6/24 - 6/27.  Labs were done.  Spouse would like SN to look at labs to advise if any of these can be used for upcoming appt with labs.  SN, pls advise which labs pt will still need.  Thank you.

## 2013-03-19 NOTE — Telephone Encounter (Signed)
Per SN---  We will use the hospital labs at this time.  Keep her appt on 7-9 and pt is aware to bring all of her meds with her to this appt.  SN will decide at her appt if any additional labs will need to be done.  I called and spoke with pt and she is aware of SN recs. Nothing further needed at this time.

## 2013-03-21 ENCOUNTER — Encounter: Payer: Self-pay | Admitting: Physician Assistant

## 2013-03-21 ENCOUNTER — Ambulatory Visit (INDEPENDENT_AMBULATORY_CARE_PROVIDER_SITE_OTHER)
Admission: RE | Admit: 2013-03-21 | Discharge: 2013-03-21 | Disposition: A | Discharge status: Home / Self Care | Payer: Medicare Other | Source: Ambulatory Visit | Attending: Pulmonary Disease | Admitting: Pulmonary Disease

## 2013-03-21 ENCOUNTER — Telehealth: Payer: Self-pay | Admitting: Physician Assistant

## 2013-03-21 ENCOUNTER — Ambulatory Visit (INDEPENDENT_AMBULATORY_CARE_PROVIDER_SITE_OTHER): Payer: Medicare Other | Admitting: Pulmonary Disease

## 2013-03-21 ENCOUNTER — Other Ambulatory Visit (INDEPENDENT_AMBULATORY_CARE_PROVIDER_SITE_OTHER): Payer: Medicare Other

## 2013-03-21 ENCOUNTER — Ambulatory Visit: Payer: Medicare Other | Admitting: Pulmonary Disease

## 2013-03-21 ENCOUNTER — Encounter: Payer: Self-pay | Admitting: Pulmonary Disease

## 2013-03-21 VITALS — BP 104/68 | HR 60 | Temp 98.2°F | Ht 62.0 in | Wt 103.6 lb

## 2013-03-21 DIAGNOSIS — I059 Rheumatic mitral valve disease, unspecified: Secondary | ICD-10-CM

## 2013-03-21 DIAGNOSIS — I5033 Acute on chronic diastolic (congestive) heart failure: Secondary | ICD-10-CM

## 2013-03-21 DIAGNOSIS — E039 Hypothyroidism, unspecified: Secondary | ICD-10-CM

## 2013-03-21 DIAGNOSIS — D649 Anemia, unspecified: Secondary | ICD-10-CM

## 2013-03-21 DIAGNOSIS — I4891 Unspecified atrial fibrillation: Secondary | ICD-10-CM

## 2013-03-21 DIAGNOSIS — E7800 Pure hypercholesterolemia, unspecified: Secondary | ICD-10-CM

## 2013-03-21 DIAGNOSIS — R0600 Dyspnea, unspecified: Secondary | ICD-10-CM

## 2013-03-21 DIAGNOSIS — M810 Age-related osteoporosis without current pathological fracture: Secondary | ICD-10-CM

## 2013-03-21 DIAGNOSIS — M545 Low back pain, unspecified: Secondary | ICD-10-CM

## 2013-03-21 DIAGNOSIS — I080 Rheumatic disorders of both mitral and aortic valves: Secondary | ICD-10-CM

## 2013-03-21 DIAGNOSIS — R0609 Other forms of dyspnea: Secondary | ICD-10-CM

## 2013-03-21 LAB — CBC WITH DIFFERENTIAL/PLATELET
Eosinophils Relative: 2 % (ref 0.0–5.0)
Monocytes Absolute: 0.8 10*3/uL (ref 0.1–1.0)
Monocytes Relative: 10 % (ref 3.0–12.0)
Neutrophils Relative %: 68.8 % (ref 43.0–77.0)
Platelets: 195 10*3/uL (ref 150.0–400.0)
WBC: 7.6 10*3/uL (ref 4.5–10.5)

## 2013-03-21 LAB — BASIC METABOLIC PANEL
BUN: 18 mg/dL (ref 6–23)
Calcium: 9.9 mg/dL (ref 8.4–10.5)
Creatinine, Ser: 0.8 mg/dL (ref 0.4–1.2)
GFR: 68.65 mL/min (ref >60.00)

## 2013-03-21 LAB — BRAIN NATRIURETIC PEPTIDE: Pro B Natriuretic peptide (BNP): 548 pg/mL — ABNORMAL HIGH (ref 0.0–100.0)

## 2013-03-21 NOTE — Progress Notes (Signed)
Representative from [facility] called to [phone] to clarify patient's discharge dosages for Lasix and Potassium.  Pt AVS summary with discharge meds read to pharmacist over the phone.  Further clarification still needed.  Contacted Ronie Spies PA with [facility] for further assistance.  Verbal order given for 40 mg Lasix PO BID 60 tablets with 2 refills.  Pt Potassium to be continued per last office visit and prescription for that has already been sent to pharmacy.  Pharmacy notes they will discontinue previous dose associated with prior d/c and use the most recent prescription they received that correlates with pt's office visit earlier today. Nino Glow RN

## 2013-03-21 NOTE — Progress Notes (Signed)
Subjective:    Patient ID: Erin Rivers, female    DOB: [birth year], 79 y.o.   MRN: [id number]  HPI 79 y/o WF here for a follow up visit... she has multiple medical problems including:  CHF w/ severe MR and chr AFib, Hyperlipidemia, Hypothyroid, Osteopenia, etc... she is followed for Cards by Walker Kehr.  ~  June 09, 2011:  462mo ROV & her CC today is contipation & we discussed Miralax, Senakot, etc;  She saw Walker Kehr 9/12- remains stable, no change in meds, she understands that she is not an operative candidate; he again asked that we discuss advanced directive and as noted in March- she has a living will & is not yet ready to discuss NCB, DNR, hospice & the like...  She notes that her breathing is good; denies CP, palpit, ch in DOE/SOB, edema; she has chr AFib w/ rate control & Coumadin; Chol ok on Simva40, & Thyroid ok on Synthroid75...  She requests Flu vaccine today.  ~  October 13, 2011:  62mo ROV & she is generally stable w/ her chr AFib,, diastolic CHF, severe MR, & PulmHTN- on Lasix 40mg - taking 1.5 tabs daily along w/ K20- 1.5 daily as well (labs showed BNP= 636 & K=5.5 & we decr the K supplement to just one Qam);  She had a recent trip to [place] & did satis- she denies SOB (+DOE w/o change), no CP/ palpit, no edema;  Continue same & coumadin clinic... See prob list below>>  ~  March 13, 2012:  969mo ROV & Erin Rivers's CC is some left arm pain just above the elbow> I offered Ortho eval & Rx but she would prefer to try rest, heat & Tramadol first... She & her husb had another great trip to [place] in [place] several months ago & they did quite well, also went to [facility] on the return leg of the voyage...    We reviewed prob list, meds, xrays and labs> see below>> LABS 7/13:  FLP- at goals on Simva40;  Chems- wnl;  CBC- Hg=11.5 MCV=94;  BNP=584  ~  September 20, 2012:  969mo ROV & she ha had a busy interval- moved to [place], grandson got married, Thanksgiving, Christmas, etc...  We reviewed the  following medical problems during today's office visit>>     HOH> she has hearing aides bilat, canals are clear but she notes incr difficulty hearing & advised to f/u w/ Audiology...    Hx +PPD> no known Tb exposure; she denies cough, sputum, SOB, hemoptysis, etc; last CXR 8/13 remained clear, NAD...    MVP, severeMR, DiastolicCHF> on ASA81, Metop50Bid, Lasix40-1.5tabs/d, K20/d; went to ER 8/13 for sharp fleeting atypCP- neg ER eval & was reassured...    ChrAFib> on Coumadin in CC & Lanoxin0.125; followed by Walker Kehr on Coumadin & rate control- stable...    Hyperlipid> on Simva40; last FLP 6/13 revealed TChol 161, TG 125, HDL 47, LDL 89    Hypothy> on Levothy75; last TSH 1/13 was 2.05    LBP, Osteopenia> on Boniva150/mo, MVI, VitD; doing satis w/ exercise program; last BMD 2009 w/ TScore -1.8 in spine & we discussed f/u BMD...    Anemia> she takes vitamins & a probiotic daily; Hg is stable in the 11-12 range... We reviewed prob list, meds, xrays and labs> see below for updates >>   ~  March 21, 2013:  969mo ROV & post hosp check> Erin Rivers was seen 5/14 by Walker Kehr for f/u of her chronic AFib, severe biatrial enlargement w/ severe  MR/ myxomatous valve w/ bileaflet prolapse & preserved LVF (EF=55%)- felt to be stable on her Metop50Bid, Lasix40-1.5tabs/d, K20/d, Lanoxin0.125, & Coumadin (no changes made)...  She was hosp 6/24 - 03/09/13 by [facility] w/ findings of acute on chronic diastolic heart failure after presenting w/ incr SOB>  Exam showed rales in lungs;  CXR showed cardiomeg & new vasc congestion w/ sm right effusion;  EKG revealed chr AFib, rate80, lat STTW abn;  2DEcho was repeated w/ good LVF, EF=55%, severe myxomatous MV w/ bileaflet prolapse & severe MR, LA&RA mod to severely dilated, modTR, mildAI, peak PAsys=66;   Labs revealed Chems wnl & Cr=0.74, BNP 2900=>584, Dig level=1.4 on the 0.125mg /d dose...  She diuresed off several lbs & improved;  She had some bradycardia on the monitor but was asymptomatic-  they decreased her BBlocker & Digoxin doses;  She was underweight & encouraged to take Ensure/ boost/ etc daily;  She was seen by [facility] & rec to consider Mitral Valve clip procedure at [facility] by DrWong/ [place]- appt arranged for 8/14;  She was disch on similar meds but less Metop25-1/2Bid & Lanoxin0.125-1/2 daily, w/ Lasix at 40Bid...  Since disch she has improved, stabilized> CXR today shows cardiomeg, improved aeration & resolved edema;  Labs today reveal normal chems, mild anemia= 11.0, BNP= 548...     We reviewed prob list, meds, xrays and labs> see below for updates >> we broached the subject of a MOST form today but she is not ready to discuss due to up-coming [facility] eval for poss surg...  CXR 7/14 showed cardiomeg, no infiltrate or edema, DJD in Tspine...  LABS 7/14:  Chems- ok w/ Cr=0.8;  CBC- Hg=11.0;  BNP=548...           Problem List:    HEARING LOSS (ICD-389.9) - she wears digital hearing aides...  ALLERGIC RHINITIS (ICD-477.9) - uses OTC meds Prn...  Hx of POSITIVE PPD (ICD-795.5) - no known Tb exposure... asymptomatic without cough, sputum, hemoptysis, etc... 11/11 c/o mild dyspnea but more trouble getting air "in"... ~  CXR 11/09 mild cardiomeg, atherosclerotic Ao, clear lungs, mild scoliosis. ~  CXR 11/10 showed similar- cardiomeg, sl incr markings, NAD. ~  CXR 11/11 showed similar cardiomeg but R>L basilar airsp dis vs atx vs effusion (improved w/ diuresis). ~  CXR 8/13 showed cardiomegaly, no edema, clear lungs, NAD.Marland Kitchen. ~  CXR 6/14 hosp showed cardiomeg, pulm vasc congestion, tiny right effusion... ~  CXR 7/14 in office showed resolution of the edema...   MITRAL VALVE PROLAPSE (ICD-424.0) -  MITRAL REGURGITATION (ICD-396.3) -  DIASTOLIC CHF -  ATRIAL FIBRILLATION, CHRONIC (ICD-427.31) - she was followed by Walker Kehr for Cardiology- his notes are reviewed... AFib dx in 2002 treated w/ rate control on METOPROLOL 50mg Bid, ASA 81mg /d and COUMADIN- followed in the coumadin  clinic... l2DEcho 6/08 showed bileaflet MVP w/ mod to severe MR...  ~  5/11:  she remains asymptomatic without CP, palpit, dizziness, syncope, dyspnea, or edema... no symptoms of TIA/ stroke... she remains very active and doing well- walks >59mi per day... ~  11/11:  presents w/ incr SOB- w/ activ & at rest, can't get a DB, sighs help, min cough, no sputum, no f/c/s, feels "tight" but no wheezing... **NOTE> CXR w/ cardiomeg & basilar edema, Labs w/ Hg=10.9, BNP>1000... Adm for diuresis- 2DEcho showed mild LVH, EF=55-60%, no regional wall motion abn, severe MR, pulmHTN w/ PAsys=55... disch on DIGOXIN 0.125mg /d & LASIX 40mg /d... ~  Post hosp check 11/11:  improved overall, Dig= 1.6,  BNP= 822, Creat= 0.9> rec incr Lasix to 60mg /d & K20 to 1.5 tabs/d as well. ~  Labs 3/12 showed BNP= 532 ~  6/12:  Follow up improved, had great trip to [place] w/o complic & stable on Lasix 60mg /d w/ BNP= 430... ~  9/12:  DrNishan reiterated that she is not an operative candidate (severe MR) but she is compensated & not yet ready to talk about NCB, DNR, Hospice, etc... ~  1/13:  She reports trip to [facility] & did satis; stable overall & BNP= 636 on her Lasix 60mg /d... ~  7/13:  She had great trip to [place]; BNP= 584 on Lasix60... ~  8/13:  EKG showed AFib, rate65, NSSTTWA; presented to ER w/ fleeting sharp AtypCP, neg eval  & was reassured... ~  6/14:  Hosp w/ diastolic CHF- EKG showed chr AFib, rate80, lat STTW abn; & 2DEcho showed good LVF, EF=55%, severe myxomatous MV w/ bileaflet prolapse & severe MR, LA&RA mod to severely dilated, modTR, mildAI, peak PAsys=66...  HYPERLIPIDEMIA (ICD-272.4) - on SIMVASTATIN 40mg /d... ~  FLP 5/09 showed TChol 181, TG 95, HDL 54, LDL 108... same med, & low fat diet. ~  FLP 5/10 showed TChol 168, TG 86, HDL 57, LDL 94 ~  FLP 5/11 showed TChol 170, TG 94, HDL 57, LDL 94 ~  FLP 3/12 showed TChol 150, Tg 118, HDL 43, LDL 84 ~  FLP 6/13 on Simva40 showed TChol 161, TG 125, HDL 47,  LDL 89  HYPOTHYROIDISM (ICD-244.9) - now on SYNTHROID 68mcg/d... clinically euthyroid & asymptomatic... ~  labs 5/09 showed TSH= 3.71... continue same med. ~  labs 5/10 showed TSH= 3.50 ~  labs 5/11 showed TSH= 5.36 ~  labs in hosp 11/11 showed TSH= 7.77 but FreeT3 & FreeT4 were WNL, they incr Synthroid to 50mcg/d anyway & we will watch HR & recheck. ~  Labs 3/12 on Synthroid75 showed TSH= 0.75 ~  Labs 1/13 showed TSH= 2.05 ~  Labs 6/14 on Synthroid75 showed TSH= 3.91  LOW BACK PAIN, CHRONIC (ICD-724.2) - she has seen a chiropractor in the past, and used Vicodin, Tramadol... currently doing satis w/ her exercise program and using OTC meds only as needed.  OSTEOPOROSIS (ICD-733.00) - prev on Actonel 35mg /wk, but changed to BONIVA 150mg /mo per new insurance co ([employer]); plus Calcium/ Vits/ etc... ~  BMD 11/07 showed TScores -1.5 sp, and -1.3 hips... sl improved from 2004 on rx. ~  BMD 12/09 showed TScores -1.8 Spine, & -1.0 left FemNeck... continue Boniva. ~  Labs 1/13 showed Vit D level = 58... rec to continue VitD 1000u daily.  ANEMIA (ICD-285.9) ~  labs 5/10 showed Hg= 12.5, MCV= 102 ~  labs 5/11 showed Hg= 12.5, MCV= 99 ~  11/11:  presented w/ dyspnea & Hg=10.9 ?etiology, noanemia w/u done in hosp as f/u labs showed Hg=12-13. ~  Labs 3/12 showed Hg= 12.1 ~  Labs 1/13 showed Hg= 11.6 ~  Labs 7/13 showed Hg= 11.5 ~  Labs 6/14-7/14 showed Hg= 11.0  SHINGLES, HX OF (ICD-V13.8) - we briefly discussed the shingles vaccine...  Health Maint:  she has never had a colonoscopy (now in her 5's & on Coumadin); no hx of GI problems & Hg= 13.0 (5/09) & 12.5 (5/10)... she gets the seasonal Flu vaccine yearly.   Past Surgical History  Procedure Laterality Date  . Myringotomies      Outpatient Encounter Prescriptions as of 03/21/2013  Medication Sig Dispense Refill  . Ascorbic Acid (VITAMIN C) 500 MG  tablet Take 500 mg by mouth daily.        Marland Kitchen aspirin 81 MG tablet Take 81 mg by mouth  daily.        . Calcium Carbonate-Vitamin D (CALTRATE 600+D) 600-400 MG-UNIT per tablet Take 1 tablet by mouth daily.        . Cholecalciferol (VITAMIN D) 1000 UNITS capsule Take 1,000 Units by mouth daily.        . digoxin (LANOXIN) 0.125 MG tablet Take 0.5 tablets (0.0625 mg total) by mouth daily.  30 tablet  11  . feeding supplement (ENSURE COMPLETE) LIQD Take 237 mLs by mouth daily.      . furosemide (LASIX) 40 MG tablet Take 1 tablet (40 mg total) by mouth 2 (two) times daily. Takes one and one-half tablets (40mg  tablets = total 60mg ) in the morning  60 tablet  11  . ibandronate (BONIVA) 150 MG tablet Take 150 mg by mouth every 30 (thirty) days. Take in the morning with a full glass of water, on an empty stomach, and do not take anything else by mouth or lie down for the next 30 min.      Marland Kitchen KLOR-CON M20 20 MEQ tablet TAKE 1 TABLET DAILY  90 tablet  2  . levothyroxine (SYNTHROID, LEVOTHROID) 75 MCG tablet Take 75 mcg by mouth daily before breakfast.      . metoprolol (LOPRESSOR) 25 MG tablet Take 0.5 tablets (12.5 mg total) by mouth 2 (two) times daily.  45 tablet  11  . Probiotic Product (PRO-FLORA CONCENTRATE) CAPS Take 1 capsule by mouth daily.        . simvastatin (ZOCOR) 40 MG tablet Take 1 tablet (40 mg total) by mouth at bedtime.  30 tablet  11  . traMADol (ULTRAM) 50 MG tablet Take 1 tablet (50 mg total) by mouth 3 (three) times daily as needed for pain.  90 tablet  0  . vitamin E 400 UNIT capsule Take 400 Units by mouth daily.        Marland Kitchen warfarin (COUMADIN) 2.5 MG tablet Take 1.25-2.5 mg by mouth at bedtime. Takes 1/2 tablet (1.25mg ) on Monday, Wednesday, Friday, and Saturday and 1 tablet (2.5mg ) on Sunday, Tuesday, and Thursday. Seen at [facility].      Marland Kitchen azithromycin (ZITHROMAX) 250 MG tablet Prn for dental      . [DISCONTINUED] potassium chloride (KLOR-CON) 20 MEQ packet Take 20 mEq by mouth daily. 1 tab in a.m. And 1/2 tab in p.m.  30 packet  11   No  facility-administered encounter medications on file as of 03/21/2013.    Allergies  Allergen Reactions  . Codeine     REACTION: Nausea/dizziness    Review of Systems        See HPI - all other systems neg except as noted...  The patient complains of dyspnea on exertion.  The patient denies anorexia, fever, weight loss, weight gain, vision loss, decreased hearing, hoarseness, chest pain, syncope, peripheral edema, prolonged cough, headaches, hemoptysis, abdominal pain, melena, hematochezia, severe indigestion/heartburn, hematuria, incontinence, muscle weakness, suspicious skin lesions, transient blindness, difficulty walking, depression, unusual weight change, abnormal bleeding, enlarged lymph nodes, and angioedema.     Objective:   Physical Exam     WD, WN, 79 y/o WF in NAD... GENERAL:  Alert & oriented; pleasant & cooperative... HEENT:  [place]/AT, EOM-wnl, PERRLA, EACs-clear, TMs-wnl, NOSE-clear, THROAT-clear & wnl. NECK:  Supple w/ fairROM; no JVD; normal carotid impulses w/o bruits; no thyromegaly or nodules palpated;  no lymphadenopathy. CHEST:  Decr BS bilat w/ few basilar rales & scat rhonchi, not coughing, no wheezing, no consolidation. HEART:  irreg AFib w/ control VR; gr 3/6 sys murmur at apex, no rubs or gallops... ABDOMEN:  Soft & nontender; normal bowel sounds; no organomegaly or masses detected. EXT: without deformities, mild arthritic changes; no varicose veins/ venous insuffic/ or edema. NEURO:  CN's intact; motor testing normal; sensory testing normal; gait normal & balance OK. DERM:  No lesions noted; no rash etc...  RADIOLOGY DATA:  Reviewed in the EPIC EMR & discussed w/ the patient...  LABORATORY DATA:  Reviewed in the EPIC EMR & discussed w/ the patient...    Assessment & Plan:    Adm w/ Diastolic CHF>> meds adjusted w/ incr Lasix 40Bid & decr Metop & Digoxin as above; continue same...    DYSPNEA, multifactorial>  This is improved, diuresis has helped, rec to  continue exercise...  AFib>  Followed by Walker Kehr, on coumadin in the CC, continue same meds & she is being referred to [facility] to consider MV clip surg...  HYPERLIPID>  Stable on Simva40 + diet etc...  HYPOTHYROID>  She remains stable on the Synthroid 54mcg/d...  DJD, LBP, Osteopenia>  Stable on her exerc program + Boniva etc...  Anemia>  Hg is stable ~11-12 range, continue vit supplements & watch for any bleeding...  MOST Form>  She is not yet ready to discuss scope of treatment issues til she knows what [facility] can offer...   Patient's Medications  New Prescriptions   No medications on file  Previous Medications   ASCORBIC ACID (VITAMIN C) 500 MG TABLET    Take 500 mg by mouth daily.     ASPIRIN 81 MG TABLET    Take 81 mg by mouth daily.     AZITHROMYCIN (ZITHROMAX) 250 MG TABLET    Prn for dental   CALCIUM CARBONATE-VITAMIN D (CALTRATE 600+D) 600-400 MG-UNIT PER TABLET    Take 1 tablet by mouth daily.     CHOLECALCIFEROL (VITAMIN D) 1000 UNITS CAPSULE    Take 1,000 Units by mouth daily.     DIGOXIN (LANOXIN) 0.125 MG TABLET    Take 0.5 tablets (0.0625 mg total) by mouth daily.   FEEDING SUPPLEMENT (ENSURE COMPLETE) LIQD    Take 237 mLs by mouth daily.   IBANDRONATE (BONIVA) 150 MG TABLET    Take 150 mg by mouth every 30 (thirty) days. Take in the morning with a full glass of water, on an empty stomach, and do not take anything else by mouth or lie down for the next 30 min.   KLOR-CON M20 20 MEQ TABLET    TAKE 1 TABLET DAILY   LEVOTHYROXINE (SYNTHROID, LEVOTHROID) 75 MCG TABLET    Take 75 mcg by mouth daily before breakfast.   METOPROLOL (LOPRESSOR) 25 MG TABLET    Take 0.5 tablets (12.5 mg total) by mouth 2 (two) times daily.   PROBIOTIC PRODUCT (PRO-FLORA CONCENTRATE) CAPS    Take 1 capsule by mouth daily.     SIMVASTATIN (ZOCOR) 40 MG TABLET    Take 1 tablet (40 mg total) by mouth at bedtime.   TRAMADOL (ULTRAM) 50 MG TABLET    Take 1 tablet (50 mg total) by mouth 3 (three) times  daily as needed for pain.   VITAMIN E 400 UNIT CAPSULE    Take 400 Units by mouth daily.     WARFARIN (COUMADIN) 2.5 MG TABLET    Take 1.25-2.5 mg by mouth at  bedtime. Takes 1/2 tablet (1.25mg ) on Monday, Wednesday, Friday, and Saturday and 1 tablet (2.5mg ) on Sunday, Tuesday, and Thursday. Seen at [facility].  Modified Medications   Modified Medication Previous Medication   FUROSEMIDE (LASIX) 40 MG TABLET furosemide (LASIX) 40 MG tablet      Take 40 mg by mouth 2 (two) times daily.    Take 1 tablet (40 mg total) by mouth 2 (two) times daily. Takes one and one-half tablets (40mg  tablets = total 60mg ) in the morning  Discontinued Medications   POTASSIUM CHLORIDE (KLOR-CON) 20 MEQ PACKET    Take 20 mEq by mouth daily. 1 tab in a.m. And 1/2 tab in p.m.

## 2013-03-21 NOTE — Telephone Encounter (Signed)
Was asked by Toni Amend, RN on [phone] to clarify a few medications that were listed on patient's discharge summary. Pt's mail order called the hospital to clarify the doses. Per discharge summary:  furosemide 40 MG tablet  Commonly known as: LASIX  Take 1 tablet (40 mg total) by mouth 2 (two) times daily. Takes one and one-half tablets (40mg  tablets = total 60mg ) in the morning   potassium chloride 20 MEQ packet  Commonly known as: KLOR-CON  Take 20 mEq by mouth daily. 1 tab in a.m. And 1/2 tab in p.m.   I reviewed Dr. Fabio Bering note. Recommended discharge dose is for Lasix 40mg  BID and KCl daily. It appears this has been fixed in EPIC by someone in Dr. Jodelle Green office from her OV there today. I gave verbal order for Lasix 40mg  BID disp #60 with 2 refills for mail order. Dr. Kriste Basque has already send in rx for potassium so she should have enough for that. I have also asked nursing at our office to contact pt to make sure she is taking correct amount [facility] nurse since he will be seeing the pt in follow-up to keep them in the loop). Bhavesh Vazquez PA-C

## 2013-03-21 NOTE — Telephone Encounter (Signed)
s/w pt's husband about the correct dose of lasix for pt. see previous note from Bernette Mayers, [place]. Correct dose is lasix 40 mg 1 tablet twice daily, husband verbalized understanding to this

## 2013-03-21 NOTE — Progress Notes (Signed)
See telephone note. Evann Koelzer PA-C

## 2013-03-21 NOTE — Patient Instructions (Addendum)
Today we updated your med list in our EPIC system...    Continue your current medications the same...  Today we did your follow up CXR & Blood work...    We will contact you w/ the results when available...   Keep your appt in August at [facility] as arranged by Walker Kehr...  Call for any questions...  Let's plan a follow up visit in 16mo, sooner if needed for problems... '

## 2013-03-21 NOTE — Telephone Encounter (Signed)
s/w pt's husband about the correct dose of lasix for pt. see previous note from Danya Dunn, PA. Correct dose is lasix 40 mg 1 tablet twice daily, husband verbalized understanding to this 

## 2013-03-21 NOTE — Telephone Encounter (Signed)
Per pt has appt at [facility]  Am of 05-01-13  With one of Dr Nash Dimmer partners./cy

## 2013-03-22 ENCOUNTER — Ambulatory Visit (INDEPENDENT_AMBULATORY_CARE_PROVIDER_SITE_OTHER): Payer: Medicare Other | Admitting: Internal Medicine

## 2013-03-22 DIAGNOSIS — I4891 Unspecified atrial fibrillation: Secondary | ICD-10-CM

## 2013-03-22 DIAGNOSIS — Z7901 Long term (current) use of anticoagulants: Secondary | ICD-10-CM

## 2013-03-22 LAB — POCT INR: INR: 2.2

## 2013-03-28 ENCOUNTER — Encounter: Payer: Self-pay | Admitting: Physician Assistant

## 2013-03-28 ENCOUNTER — Ambulatory Visit (INDEPENDENT_AMBULATORY_CARE_PROVIDER_SITE_OTHER): Payer: Medicare Other | Admitting: Physician Assistant

## 2013-03-28 VITALS — BP 100/70 | HR 75 | Ht 62.0 in | Wt 103.0 lb

## 2013-03-28 DIAGNOSIS — I4891 Unspecified atrial fibrillation: Secondary | ICD-10-CM

## 2013-03-28 DIAGNOSIS — I059 Rheumatic mitral valve disease, unspecified: Secondary | ICD-10-CM

## 2013-03-28 DIAGNOSIS — I340 Nonrheumatic mitral (valve) insufficiency: Secondary | ICD-10-CM

## 2013-03-28 DIAGNOSIS — I5032 Chronic diastolic (congestive) heart failure: Secondary | ICD-10-CM

## 2013-03-28 NOTE — Patient Instructions (Addendum)
Your physician recommends that you schedule a follow-up appointment in: 1 to 2 months with Dr.Nishan.

## 2013-03-28 NOTE — Progress Notes (Signed)
[address] [place], [place]  [address] Phone: [phone] Fax:  [phone]  Date:  03/28/2013   ID:  Erin Rivers, DOB 1921/08/24, MRN [id number]  PCP:  Michele Mcalpine, MD  Cardiologist:  Dr. Charlton Haws     History of Present Illness: Erin Rivers is a 79 y.o. female who returns for follow up after a recent admission to the hospital with a/c diastolic CHF in the setting of severe mitral regurgitation 6/24-6/27. She has a hx of mitral regurgitation, diastolic CHF and permanent atrial fibrillation. She is on chronic Coumadin therapy. She presented to the hospital with increasing shortness of breath, orthopnea and PND. She improved clinically with diuresis with IV Lasix. 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 She is not a surgical candidate.  [name] has considered referring her to [facility] for mitral valve clipping. Notes from the hospital indicate that he would send a copy of her echocardiogram to Dr. Modesto Charon at [facility].  Of note, patient did have bradycardia and her digoxin and beta blocker doses were reduced.  Since d/c, she is doing well.  Denies significant dyspnea.  No CP.  No syncope.  No orthopnea, PND, edema.  She has an appointment with Dr. Earma Reading at [facility] 8/19.  Labs (6/14):  K 4.1, Cr 0.74, proBNP 2904, THS 3.907 Labs (7/14):  K 4.3, Cr 0.8, proBNP 548, Hgb 11   Wt Readings from Last 3 Encounters:  03/28/13 103 lb (46.72 kg)  03/21/13 103 lb 9.6 oz (46.993 kg)  03/09/13 98 lb (44.453 kg)     Past Medical History  Diagnosis Date  . MVP (mitral valve prolapse)   . Hyperlipidemia   . Hypertension   . Osteoporosis   . Anemia   . Hypothyroid   . Atrial fibrillation     permanent; coumadin Rx  . Mitral regurgitation     a. not a surgical candidate;  b. Echocardiogram 03/07/13: EF 55%, mild AI, severe myxomatous mitral valve with bileaflet  prolapse and severe MR, severe LAE, moderate RAE, moderate TR, PASP 66 => to see Dr Earma Reading at [facility] to consider mitral clip 04/2013  . Hypercholesteremia   . Pneumonia   . Hearing loss   . Allergic rhinitis   . Chronic low back pain   . Shingles   . Chronic diastolic heart failure     Current Outpatient Prescriptions  Medication Sig Dispense Refill  . Ascorbic Acid (VITAMIN C) 500 MG tablet Take 500 mg by mouth daily.        Marland Kitchen aspirin 81 MG tablet Take 81 mg by mouth daily.        Marland Kitchen azithromycin (ZITHROMAX) 250 MG tablet Prn for dental      . Calcium Carbonate-Vitamin D (CALTRATE 600+D) 600-400 MG-UNIT per tablet Take 1 tablet by mouth daily.        . Cholecalciferol (VITAMIN D) 1000 UNITS capsule Take 1,000 Units by mouth daily.        . digoxin (LANOXIN) 0.125 MG tablet Take 0.5 tablets (0.0625 mg total) by mouth daily.  30 tablet  11  . feeding supplement (ENSURE COMPLETE) LIQD Take 237 mLs by mouth daily.      . furosemide (LASIX) 40 MG tablet Take 40 mg by mouth 2 (two) times daily.      Marland Kitchen ibandronate (BONIVA) 150 MG tablet Take 150 mg by mouth every  30 (thirty) days. Take in the morning with a full glass of water, on an empty stomach, and do not take anything else by mouth or lie down for the next 30 min.      Marland Kitchen KLOR-CON M20 20 MEQ tablet TAKE 1 TABLET DAILY  90 tablet  2  . levothyroxine (SYNTHROID, LEVOTHROID) 75 MCG tablet Take 75 mcg by mouth daily before breakfast.      . metoprolol (LOPRESSOR) 25 MG tablet Take 0.5 tablets (12.5 mg total) by mouth 2 (two) times daily.  45 tablet  11  . Probiotic Product (PRO-FLORA CONCENTRATE) CAPS Take 1 capsule by mouth daily.        . simvastatin (ZOCOR) 40 MG tablet Take 1 tablet (40 mg total) by mouth at bedtime.  30 tablet  11  . traMADol (ULTRAM) 50 MG tablet Take 1 tablet (50 mg total) by mouth 3 (three) times daily as needed for pain.  90 tablet  0  . vitamin E 400 UNIT capsule Take 400 Units by mouth daily.        Marland Kitchen warfarin  (COUMADIN) 2.5 MG tablet Take 1.25-2.5 mg by mouth at bedtime. Takes 1/2 tablet (1.25mg ) on Monday, Wednesday, Friday, and Saturday and 1 tablet (2.5mg ) on Sunday, Tuesday, and Thursday. Seen at [facility].       No current facility-administered medications for this visit.    Allergies:    Allergies  Allergen Reactions  . Codeine     REACTION: Nausea/dizziness    Social History:  The patient  reports that she quit smoking about 44 years ago. Her smoking use included Cigarettes. She smoked 0.00 packs per day for 10 years. She has never used smokeless tobacco. She reports that  drinks alcohol.   ROS:  Please see the history of present illness.      All other systems reviewed and negative.   PHYSICAL EXAM: VS:  BP 100/70  Pulse 75  Ht 5\' 2"  (1.575 m)  Wt 103 lb (46.72 kg)  BMI 18.83 kg/m2 Well nourished, well developed, in no acute distress HEENT: normal Neck: no JVD Cardiac:  normal S1, S2; irregularly irregular rhythm; 3/6 holosystolic murmur at the apex Lungs:  clear to auscultation bilaterally, no wheezing, rhonchi or rales Abd: soft, nontender, no hepatomegaly Ext: no edema Skin: warm and dry Neuro:  CNs 2-12 intact, no focal abnormalities noted  EKG:  AFib, HR 75     ASSESSMENT AND PLAN:  1. Chronic Diastolic CHF:  Volume stable. Creatinine stable by recent labs. Continue current Rx. 2. Severe Mitral Regurgitation:  She has an appointment with Dr. Earma Reading 05/01/2013 to discuss mitral valve clipping.  Patient had several questions about this procedure.  I tried to answer all of them.  I advised her to discuss these questions with the surgeon as well.   3. Atrial Fibrillation:  Rate controlled.  No further bradycardia.  Continue coumadin.   4. Disposition:  F/u with Dr. Charlton Haws in 1-2 months.   Signed, Tereso Newcomer, PA-C  03/28/2013 3:42 PM

## 2013-04-05 ENCOUNTER — Telehealth: Payer: Self-pay | Admitting: Cardiovascular Disease

## 2013-04-05 ENCOUNTER — Ambulatory Visit (INDEPENDENT_AMBULATORY_CARE_PROVIDER_SITE_OTHER): Payer: Medicare Other | Admitting: Pharmacist

## 2013-04-05 DIAGNOSIS — Z7901 Long term (current) use of anticoagulants: Secondary | ICD-10-CM

## 2013-04-05 DIAGNOSIS — I4891 Unspecified atrial fibrillation: Secondary | ICD-10-CM

## 2013-04-05 NOTE — Telephone Encounter (Addendum)
Called patient back. She has an appointment at [facility] with [facility] on 8/19 to discuss possible mitral valve clipping but is confused as to if she needs to see him or Dr.Wong. Advised will ask Dr.Nishan and call her back.

## 2013-04-05 NOTE — Telephone Encounter (Signed)
New Prob  Pt made an appt with a Doctor at [facility] but she wants to make sure that she scheduled with the right provider.

## 2013-04-08 NOTE — Telephone Encounter (Signed)
Earma Reading and Modesto Charon work together

## 2013-04-09 NOTE — Telephone Encounter (Signed)
I advised patient that Drs. Earma Reading and Modesto Charon work together.  Patient verbalized understanding and gratitude.

## 2013-04-11 ENCOUNTER — Telehealth: Payer: Self-pay | Admitting: Physician Assistant

## 2013-04-11 NOTE — Telephone Encounter (Signed)
New problem    Pt is confused because she said the nurse that comes in to check her coumadin called here and gave her INR and  said that her coumadin appt has changed. Pt want to know when is her appt and someone please call her. She stated the 04/13/13 appt isn't correct. She want to speak to someone in the coumadin clinic

## 2013-04-11 NOTE — Telephone Encounter (Signed)
Spouse aware that [facility] nurse is scheduled for INR check on 04/26/13.

## 2013-04-11 NOTE — Telephone Encounter (Signed)
Spouse with pt spouse, appt for 04/13/13 was cancelled as it is any old/previous appt.

## 2013-04-26 ENCOUNTER — Ambulatory Visit (INDEPENDENT_AMBULATORY_CARE_PROVIDER_SITE_OTHER): Payer: Medicare Other | Admitting: Pharmacist

## 2013-04-26 ENCOUNTER — Encounter: Payer: Self-pay | Admitting: Pharmacist

## 2013-04-26 DIAGNOSIS — Z7901 Long term (current) use of anticoagulants: Secondary | ICD-10-CM

## 2013-04-26 DIAGNOSIS — I4891 Unspecified atrial fibrillation: Secondary | ICD-10-CM

## 2013-04-26 LAB — POCT INR: INR: 2.3

## 2013-04-26 NOTE — Telephone Encounter (Signed)
This encounter was created in error - please disregard.

## 2013-05-23 ENCOUNTER — Ambulatory Visit (INDEPENDENT_AMBULATORY_CARE_PROVIDER_SITE_OTHER): Payer: Medicare Other | Admitting: Pulmonary Disease

## 2013-05-23 ENCOUNTER — Encounter: Payer: Self-pay | Admitting: Pulmonary Disease

## 2013-05-23 VITALS — BP 116/60 | HR 70 | Temp 98.4°F | Ht 62.0 in | Wt 107.6 lb

## 2013-05-23 DIAGNOSIS — I059 Rheumatic mitral valve disease, unspecified: Secondary | ICD-10-CM

## 2013-05-23 DIAGNOSIS — I4891 Unspecified atrial fibrillation: Secondary | ICD-10-CM

## 2013-05-23 DIAGNOSIS — M545 Low back pain: Secondary | ICD-10-CM

## 2013-05-23 DIAGNOSIS — Z7901 Long term (current) use of anticoagulants: Secondary | ICD-10-CM

## 2013-05-23 DIAGNOSIS — I080 Rheumatic disorders of both mitral and aortic valves: Secondary | ICD-10-CM

## 2013-05-23 DIAGNOSIS — M810 Age-related osteoporosis without current pathological fracture: Secondary | ICD-10-CM

## 2013-05-23 DIAGNOSIS — I5033 Acute on chronic diastolic (congestive) heart failure: Secondary | ICD-10-CM

## 2013-05-23 DIAGNOSIS — Z23 Encounter for immunization: Secondary | ICD-10-CM

## 2013-05-23 DIAGNOSIS — E039 Hypothyroidism, unspecified: Secondary | ICD-10-CM

## 2013-05-23 NOTE — Patient Instructions (Addendum)
Today we updated your med list in our EPIC system...    Continue your current medications the same...  We gave you the 2014 Flu vaccine today...  Good luck w/ the cardiac testing at [facility] & let me know when the decision is made regarding surgery...  Let's plan a follow up visit in 2-3 months.Marland KitchenMarland Kitchen

## 2013-05-23 NOTE — Progress Notes (Signed)
Subjective:    Patient ID: Erin Rivers, female    DOB: [birth year], 79 y.o.   MRN: [id number]  HPI 79 y/o WF here for a follow up visit... she has multiple medical problems including:  CHF w/ severe MR and chr AFib, Hyperlipidemia, Hypothyroid, Osteopenia, etc... she is followed for Cards by Walker Kehr.  ~  October 13, 2011:  42mo ROV & she is generally stable w/ her chr AFib,, diastolic CHF, severe MR, & PulmHTN- on Lasix 40mg - taking 1.5 tabs daily along w/ K20- 1.5 daily as well (labs showed BNP= 636 & K=5.5 & we decr the K supplement to just one Qam);  She had a recent trip to [place] & did satis- she denies SOB (+DOE w/o change), no CP/ palpit, no edema;  Continue same & coumadin clinic... See prob list below>>  ~  March 13, 2012:  28mo ROV & Erin Rivers's CC is some left arm pain just above the elbow> I offered Ortho eval & Rx but she would prefer to try rest, heat & Tramadol first... She & her husb had another great trip to [place] in [place] several months ago & they did quite well, also went to [facility] on the return leg of the voyage...    We reviewed prob list, meds, xrays and labs> see below>> LABS 7/13:  FLP- at goals on Simva40;  Chems- wnl;  CBC- Hg=11.5 MCV=94;  BNP=584  ~  September 20, 2012:  28mo ROV & she ha had a busy interval- moved to [place], grandson got married, Thanksgiving, Christmas, etc...  We reviewed the following medical problems during today's office visit>>     HOH> she has hearing aides bilat, canals are clear but she notes incr difficulty hearing & advised to f/u w/ Audiology...    Hx +PPD> no known Tb exposure; she denies cough, sputum, SOB, hemoptysis, etc; last CXR 8/13 remained clear, NAD...    MVP, severeMR, DiastolicCHF> on ASA81, Metop50Bid, Lasix40-1.5tabs/d, K20/d; went to ER 8/13 for sharp fleeting atypCP- neg ER eval & was reassured...    ChrAFib> on Coumadin in CC & Lanoxin0.125; followed by Walker Kehr on Coumadin & rate control- stable...    Hyperlipid> on  Simva40; last FLP 6/13 revealed TChol 161, TG 125, HDL 47, LDL 89    Hypothy> on Levothy75; last TSH 1/13 was 2.05    LBP, Osteopenia> on Boniva150/mo, MVI, VitD; doing satis w/ exercise program; last BMD 2009 w/ TScore -1.8 in spine & we discussed f/u BMD...    Anemia> she takes vitamins & a probiotic daily; Hg is stable in the 11-12 range... We reviewed prob list, meds, xrays and labs> see below for updates >>   ~  March 21, 2013:  28mo ROV & post hosp check> Erin Rivers was seen 5/14 by Walker Kehr for f/u of her chronic AFib, severe biatrial enlargement w/ severe MR/ myxomatous valve w/ bileaflet prolapse & preserved LVF (EF=55%)- felt to be stable on her Metop50Bid, Lasix40-1.5tabs/d, K20/d, Lanoxin0.125, & Coumadin (no changes made)...  She was hosp 6/24 - 03/09/13 by [facility] w/ findings of acute on chronic diastolic heart failure after presenting w/ incr SOB>  Exam showed rales in lungs;  CXR showed cardiomeg & new vasc congestion w/ sm right effusion;  EKG revealed chr AFib, rate80, lat STTW abn;  2DEcho was repeated w/ good LVF, EF=55%, severe myxomatous MV w/ bileaflet prolapse & severe MR, LA&RA mod to severely dilated, modTR, mildAI, peak PAsys=66;   Labs revealed Chems wnl & Cr=0.74, BNP 2900=>584,  Dig level=1.4 on the 0.125mg /d dose...  She diuresed off several lbs & improved;  She had some bradycardia on the monitor but was asymptomatic- they decreased her BBlocker & Digoxin doses;  She was underweight & encouraged to take Ensure/ boost/ etc daily;  She was seen by [facility] & rec to consider Mitral Valve clip procedure at [facility] by DrWong/ [place]- appt arranged for 8/14;  She was disch on similar meds but less Metop25-1/2Bid & Lanoxin0.125-1/2 daily, w/ Lasix at 40Bid...  Since disch she has improved, stabilized> CXR today shows cardiomeg, improved aeration & resolved edema;  Labs today reveal normal chems, mild anemia= 11.0, BNP= 548...     We reviewed prob list, meds, xrays and labs> see below for updates  >> we broached the subject of a MOST form today but she is not ready to discuss due to up-coming [facility] eval for poss surg...  CXR 7/14 showed cardiomeg, no infiltrate or edema, DJD in Tspine...  LABS 7/14:  Chems- ok w/ Cr=0.8;  CBC- Hg=11.0;  BNP=548...   ~  May 23, 2013:  95mo ROV & Erin Rivers has turned 79 y/o & really looks good- she went to [facility] 9/14 & saw DrBashore w/ severe MR due to bileaflet prolapse, they were not certain that her anatomy would be amenable to the MitraClip & wanted to do a TEE w/ DrGlower & this is sched for next week (to help decide HeartPort MVrepair vs MitraClip...  She had a fabulous 89th birthday w/ family at [facility]!, she has been drinking Boost supplements & has gained 4-5# w/o edema or rales, we reviewed the following medical problems during today's office visit >>     HOH> she has hearing aides bilat, canals are clear but she notes incr difficulty hearing & advised to f/u w/ Audiology...    Hx +PPD> no known Tb exposure; she denies cough, sputum, SOB, hemoptysis, etc; last CXR 7/14 showed cardiomegaly, clear lungs, degen changes in Tspine...    MVP, severeMR, DiastolicCHF> on ASA81, Metop50-1/2Bid, Lasix40Bid, K20/d; she was hosp 6/14 as noted- diuresed, meds adjusted, improved; went to [facility] 8/14 & TEE pending before deciding about MitraClip...    ChrAFib> on Coumadin in CC & Lanoxin0.125-1/2 daily; followed by [facility] on Coumadin & rate control- stable...    Hyperlipid> on Simva40- last FLP 6/13 revealed TChol 161, TG 125, HDL 47, LDL 89; continue same...    Hypothy> on Levothy75; last TSH 6/14 was 3.91    LBP, Osteopenia> on Boniva150/mo, MVI, VitD; doing satis w/ exercise program; last BMD 2009 w/ TScore -1.8 in spine & we discussed f/u BMD...    Anemia> she takes vitamins & a probiotic daily; Hg is stable in the 11-12 range... We reviewed prob list, meds, xrays and labs> see below for updates >>            Problem List:    HEARING LOSS (ICD-389.9)  - she wears digital hearing aides...  ALLERGIC RHINITIS (ICD-477.9) - uses OTC meds Prn...  Hx of POSITIVE PPD (ICD-795.5) - no known Tb exposure... asymptomatic without cough, sputum, hemoptysis, etc... 11/11 c/o mild dyspnea but more trouble getting air "in"... ~  CXR 11/09 mild cardiomeg, atherosclerotic Ao, clear lungs, mild scoliosis. ~  CXR 11/10 showed similar- cardiomeg, sl incr markings, NAD. ~  CXR 11/11 showed similar cardiomeg but R>L basilar airsp dis vs atx vs effusion (improved w/ diuresis). ~  CXR 8/13 showed cardiomegaly, no edema, clear lungs, NAD.Marland Kitchen. ~  CXR 6/14 hosp  showed cardiomeg, pulm vasc congestion, tiny right effusion... ~  CXR 7/14 in office showed resolution of the edema (cardiomegaly, clear lungs, degen changes in Tspine).  MITRAL VALVE PROLAPSE (ICD-424.0) -  MITRAL REGURGITATION (ICD-396.3) -  DIASTOLIC CHF -  ATRIAL FIBRILLATION, CHRONIC (ICD-427.31) - she was followed by Walker Kehr for Cardiology- his notes are reviewed... AFib dx in 2002 treated w/ rate control on METOPROLOL 50mg Bid, ASA 81mg /d and COUMADIN- followed in the coumadin clinic... l2DEcho 6/08 showed bileaflet MVP w/ mod to severe MR...  ~  5/11:  she remains asymptomatic without CP, palpit, dizziness, syncope, dyspnea, or edema... no symptoms of TIA/ stroke... she remains very active and doing well- walks >49mi per day... ~  11/11:  presents w/ incr SOB- w/ activ & at rest, can't get a DB, sighs help, min cough, no sputum, no f/c/s, feels "tight" but no wheezing... **NOTE> CXR w/ cardiomeg & basilar edema, Labs w/ Hg=10.9, BNP>1000... Adm for diuresis- 2DEcho showed mild LVH, EF=55-60%, no regional wall motion abn, severe MR, pulmHTN w/ PAsys=55... disch on DIGOXIN 0.125mg /d & LASIX 40mg /d... ~  Post hosp check 11/11:  improved overall, Dig= 1.6, BNP= 822, Creat= 0.9> rec incr Lasix to 60mg /d & K20 to 1.5 tabs/d as well. ~  Labs 3/12 showed BNP= 532 ~  6/12:  Follow up improved, had great trip to  [place] w/o complic & stable on Lasix 60mg /d w/ BNP= 430... ~  9/12:  DrNishan reiterated that she is not an operative candidate (severe MR) but she is compensated & not yet ready to talk about NCB, DNR, Hospice, etc... ~  1/13:  She reports trip to [facility] & did satis; stable overall & BNP= 636 on her Lasix 60mg /d... ~  7/13:  She had great trip to [place] [employer] [place]; BNP= 584 on Lasix60... ~  8/13:  EKG showed AFib, rate65, NSSTTWA; presented to ER w/ fleeting sharp AtypCP, neg eval  & was reassured... ~  6/14:  Hosp w/ diastolic CHF- EKG showed chr AFib, rate80, lat STTW abn; & 2DEcho showed good LVF, EF=55%, severe myxomatous MV w/ bileaflet prolapse & severe MR, LA&RA mod to severely dilated, modTR, mildAI, peak PAsys=66... ~  8/14:  Eval at [facility] w/ severe MR due to bileaflet prolapse, they were not certain that her anatomy would be amenable to the MitraClip vs HeartPort MVrepair & TEE is pending...  HYPERLIPIDEMIA (ICD-272.4) - on SIMVASTATIN 40mg /d... ~  FLP 5/09 showed TChol 181, TG 95, HDL 54, LDL 108... same med, & low fat diet. ~  FLP 5/10 showed TChol 168, TG 86, HDL 57, LDL 94 ~  FLP 5/11 showed TChol 170, TG 94, HDL 57, LDL 94 ~  FLP 3/12 showed TChol 150, Tg 118, HDL 43, LDL 84 ~  FLP 6/13 on Simva40 showed TChol 161, TG 125, HDL 47, LDL 89  HYPOTHYROIDISM (ICD-244.9) - now on SYNTHROID 38mcg/d... clinically euthyroid & asymptomatic... ~  labs 5/09 showed TSH= 3.71... continue same med. ~  labs 5/10 showed TSH= 3.50 ~  labs 5/11 showed TSH= 5.36 ~  labs in hosp 11/11 showed TSH= 7.77 but FreeT3 & FreeT4 were WNL, they incr Synthroid to 33mcg/d anyway & we will watch HR & recheck. ~  Labs 3/12 on Synthroid75 showed TSH= 0.75 ~  Labs 1/13 showed TSH= 2.05 ~  Labs 6/14 on Synthroid75 showed TSH= 3.91  LOW BACK PAIN, CHRONIC (ICD-724.2) - she has seen a chiropractor in the past, and used Vicodin, Tramadol... currently doing satis w/ her  exercise program and using OTC  meds only as needed.  OSTEOPOROSIS (ICD-733.00) - prev on Actonel 35mg /wk, but changed to BONIVA 150mg /mo per new insurance co ([employer]); plus Calcium/ Vits/ etc... ~  BMD 11/07 showed TScores -1.5 sp, and -1.3 hips... sl improved from 2004 on rx. ~  BMD 12/09 showed TScores -1.8 Spine, & -1.0 left FemNeck... continue Boniva. ~  Labs 1/13 showed Vit D level = 58... rec to continue VitD 1000u daily.  ANEMIA (ICD-285.9) ~  labs 5/10 showed Hg= 12.5, MCV= 102 ~  labs 5/11 showed Hg= 12.5, MCV= 99 ~  11/11:  presented w/ dyspnea & Hg=10.9 ?etiology, noanemia w/u done in hosp as f/u labs showed Hg=12-13. ~  Labs 3/12 showed Hg= 12.1 ~  Labs 1/13 showed Hg= 11.6 ~  Labs 7/13 showed Hg= 11.5 ~  Labs 6/14-7/14 showed Hg= 11.0  SHINGLES, HX OF (ICD-V13.8) - we briefly discussed the shingles vaccine...  Health Maint:  she has never had a colonoscopy (now in her 6's & on Coumadin); no hx of GI problems & Hg= 13.0 (5/09) & 12.5 (5/10)... she gets the seasonal Flu vaccine yearly.   Past Surgical History  Procedure Laterality Date  . Myringotomies      Outpatient Encounter Prescriptions as of 05/23/2013  Medication Sig Dispense Refill  . Ascorbic Acid (VITAMIN C) 500 MG tablet Take 500 mg by mouth daily.        Marland Kitchen aspirin 81 MG tablet Take 81 mg by mouth daily.        Marland Kitchen azithromycin (ZITHROMAX) 250 MG tablet Prn for dental      . Calcium Carbonate-Vitamin D (CALTRATE 600+D) 600-400 MG-UNIT per tablet Take 1 tablet by mouth daily.        . Cholecalciferol (VITAMIN D) 1000 UNITS capsule Take 1,000 Units by mouth daily.        . digoxin (LANOXIN) 0.125 MG tablet Take 0.5 tablets (0.0625 mg total) by mouth daily.  30 tablet  11  . feeding supplement (ENSURE COMPLETE) LIQD Take 237 mLs by mouth daily.      . furosemide (LASIX) 40 MG tablet Take 40 mg by mouth 2 (two) times daily.      Marland Kitchen ibandronate (BONIVA) 150 MG tablet Take 150 mg by mouth every 30 (thirty) days. Take in the morning with a  full glass of water, on an empty stomach, and do not take anything else by mouth or lie down for the next 30 min.      Marland Kitchen KLOR-CON M20 20 MEQ tablet TAKE 1 TABLET DAILY  90 tablet  2  . levothyroxine (SYNTHROID, LEVOTHROID) 75 MCG tablet Take 75 mcg by mouth daily before breakfast.      . metoprolol (LOPRESSOR) 25 MG tablet Take 0.5 tablets (12.5 mg total) by mouth 2 (two) times daily.  45 tablet  11  . Probiotic Product (PRO-FLORA CONCENTRATE) CAPS Take 1 capsule by mouth daily.        . simvastatin (ZOCOR) 40 MG tablet Take 1 tablet (40 mg total) by mouth at bedtime.  30 tablet  11  . traMADol (ULTRAM) 50 MG tablet Take 1 tablet (50 mg total) by mouth 3 (three) times daily as needed for pain.  90 tablet  0  . vitamin E 400 UNIT capsule Take 400 Units by mouth daily.        Marland Kitchen warfarin (COUMADIN) 2.5 MG tablet Take 1.25-2.5 mg by mouth at bedtime. Takes 1/2 tablet (1.25mg ) on Monday, Wednesday, Friday,  and Saturday and 1 tablet (2.5mg ) on Sunday, Tuesday, and Thursday. Seen at [facility].       No facility-administered encounter medications on file as of 05/23/2013.    Allergies  Allergen Reactions  . Codeine     REACTION: Nausea/dizziness    Review of Systems        See HPI - all other systems neg except as noted...  The patient complains of dyspnea on exertion.  The patient denies anorexia, fever, weight loss, weight gain, vision loss, decreased hearing, hoarseness, chest pain, syncope, peripheral edema, prolonged cough, headaches, hemoptysis, abdominal pain, melena, hematochezia, severe indigestion/heartburn, hematuria, incontinence, muscle weakness, suspicious skin lesions, transient blindness, difficulty walking, depression, unusual weight change, abnormal bleeding, enlarged lymph nodes, and angioedema.     Objective:   Physical Exam     WD, WN, 79 y/o WF in NAD... GENERAL:  Alert & oriented; pleasant & cooperative... HEENT:  [place]/AT, EOM-wnl, PERRLA, EACs-clear, TMs-wnl,  NOSE-clear, THROAT-clear & wnl. NECK:  Supple w/ fairROM; no JVD; normal carotid impulses w/o bruits; no thyromegaly or nodules palpated; no lymphadenopathy. CHEST:  Decr BS bilat w/ few basilar rales & scat rhonchi, not coughing, no wheezing, no consolidation. HEART:  irreg AFib w/ control VR; gr 3/6 sys murmur at apex, no rubs or gallops... ABDOMEN:  Soft & nontender; normal bowel sounds; no organomegaly or masses detected. EXT: without deformities, mild arthritic changes; no varicose veins/ venous insuffic/ or edema. NEURO:  CN's intact; motor testing normal; sensory testing normal; gait normal & balance OK. DERM:  No lesions noted; no rash etc...  RADIOLOGY DATA:  Reviewed in the EPIC EMR & discussed w/ the patient...  LABORATORY DATA:  Reviewed in the EPIC EMR & discussed w/ the patient...    Assessment & Plan:    Adm 6/14 w/ Diastolic CHF>> meds adjusted w/ incr Lasix 40Bid & decr Metop & Digoxin as above; continue same...  S/P initial eval at [facility] for poss mitral valve clip... TEE pending   DYSPNEA, multifactorial>  This is improved, diuresis has helped, rec to continue exercise...  AFib>  Followed by Walker Kehr, on coumadin in the CC, continue same meds...  HYPERLIPID>  Stable on Simva40 + diet etc...  HYPOTHYROID>  She remains stable on the Synthroid 86mcg/d...  DJD, LBP, Osteopenia>  Stable on her exerc program + Boniva etc...  Anemia>  Hg is stable ~11-12 range, continue vit supplements & watch for any bleeding...   Patient's Medications  New Prescriptions   No medications on file  Previous Medications   ASCORBIC ACID (VITAMIN C) 500 MG TABLET    Take 500 mg by mouth daily.     ASPIRIN 81 MG TABLET    Take 81 mg by mouth daily.     AZITHROMYCIN (ZITHROMAX) 250 MG TABLET    Prn for dental   CALCIUM CARBONATE-VITAMIN D (CALTRATE 600+D) 600-400 MG-UNIT PER TABLET    Take 1 tablet by mouth daily.     CHOLECALCIFEROL (VITAMIN D) 1000 UNITS CAPSULE    Take 1,000 Units by  mouth daily.     DIGOXIN (LANOXIN) 0.125 MG TABLET    Take 0.5 tablets (0.0625 mg total) by mouth daily.   FEEDING SUPPLEMENT (ENSURE COMPLETE) LIQD    Take 237 mLs by mouth daily.   FUROSEMIDE (LASIX) 40 MG TABLET    Take 40 mg by mouth 2 (two) times daily.   IBANDRONATE (BONIVA) 150 MG TABLET    Take 150 mg by mouth every 30 (thirty)  days. Take in the morning with a full glass of water, on an empty stomach, and do not take anything else by mouth or lie down for the next 30 min.   LEVOTHYROXINE (SYNTHROID, LEVOTHROID) 75 MCG TABLET    Take 75 mcg by mouth daily before breakfast.   METOPROLOL (LOPRESSOR) 25 MG TABLET    Take 0.5 tablets (12.5 mg total) by mouth 2 (two) times daily.   PROBIOTIC PRODUCT (PRO-FLORA CONCENTRATE) CAPS    Take 1 capsule by mouth daily.     SIMVASTATIN (ZOCOR) 40 MG TABLET    Take 1 tablet (40 mg total) by mouth at bedtime.   TRAMADOL (ULTRAM) 50 MG TABLET    Take 1 tablet (50 mg total) by mouth 3 (three) times daily as needed for pain.   VITAMIN E 400 UNIT CAPSULE    Take 400 Units by mouth daily.     WARFARIN (COUMADIN) 2.5 MG TABLET    Take 1.25-2.5 mg by mouth at bedtime. Takes 1/2 tablet (1.25mg ) on Monday, Wednesday, Friday, and Saturday and 1 tablet (2.5mg ) on Sunday, Tuesday, and Thursday. Seen at [facility].  Modified Medications   Modified Medication Previous Medication   POTASSIUM CHLORIDE SA (KLOR-CON M20) 20 MEQ TABLET KLOR-CON M20 20 MEQ tablet          TAKE 1 TABLET DAILY  Discontinued Medications   No medications on file

## 2013-05-25 ENCOUNTER — Ambulatory Visit (INDEPENDENT_AMBULATORY_CARE_PROVIDER_SITE_OTHER): Payer: Medicare Other | Admitting: Pharmacist

## 2013-05-25 DIAGNOSIS — Z7901 Long term (current) use of anticoagulants: Secondary | ICD-10-CM

## 2013-05-25 DIAGNOSIS — I4891 Unspecified atrial fibrillation: Secondary | ICD-10-CM

## 2013-05-25 LAB — POCT INR: INR: 2.1

## 2013-05-28 ENCOUNTER — Telehealth: Payer: Self-pay | Admitting: Pulmonary Disease

## 2013-05-28 MED ORDER — FUROSEMIDE 40 MG PO TABS: 40.0000 mg | ORAL_TABLET | Freq: Two times a day (BID) | ORAL | Status: DC

## 2013-05-28 NOTE — Telephone Encounter (Signed)
I spoke [facility] and was advised they are needing update RX for pt lasix. i have sent this in and nothing further needed

## 2013-06-07 ENCOUNTER — Telehealth: Payer: Self-pay | Admitting: Pulmonary Disease

## 2013-06-07 NOTE — Telephone Encounter (Signed)
SN is aware. Nothing further is needed.  

## 2013-06-07 NOTE — Telephone Encounter (Signed)
Spoke with Spouse-- Pt seen by [facility] recently. Pt has Miltral Valve leak--[facility] choosing to wait a few months instead of operating now--would like to see if new medications will help to decrease this. Will plan to discuss at later time about possible operations.  Appt with Carroll Sage 09/03/13 to discuss medication changes or possible surgery is leak is still persistent. Pt has appt with Kriste Basque 12/16 at 3pm.  Will send to [facility] as FYI of update from Spouse.

## 2013-06-29 ENCOUNTER — Ambulatory Visit (INDEPENDENT_AMBULATORY_CARE_PROVIDER_SITE_OTHER): Payer: Medicare Other | Admitting: Pharmacist

## 2013-06-29 DIAGNOSIS — Z7901 Long term (current) use of anticoagulants: Secondary | ICD-10-CM

## 2013-06-29 DIAGNOSIS — I4891 Unspecified atrial fibrillation: Secondary | ICD-10-CM

## 2013-07-04 ENCOUNTER — Telehealth: Payer: Self-pay | Admitting: Cardiovascular Disease

## 2013-07-04 NOTE — Telephone Encounter (Signed)
LMTCB ./CY 

## 2013-07-04 NOTE — Telephone Encounter (Signed)
New Problem  Is there a problem with pt having a cataract operation before she goes to [facility] for a consultation.  Please advise

## 2013-07-12 NOTE — Telephone Encounter (Signed)
Would try to see Bashore at [facility] and figure out valve before any cataract surgery

## 2013-07-12 NOTE — Telephone Encounter (Signed)
SPOKE WITH PT'S  HUSBAND   RE MESSAGE PT  HAS  CATARACTS  AND  DID NOT  KNOW  IF  COULD   HAVE SURGERY  HAS APPT  AT [facility] IN  DEC  RE  VALVE WILL DISCUSS WITH  DR NISHAN./CY

## 2013-07-12 NOTE — Telephone Encounter (Signed)
PT'S  HUSBAND  NOTIFIED./CY

## 2013-07-17 ENCOUNTER — Telehealth: Payer: Self-pay | Admitting: Pulmonary Disease

## 2013-07-17 ENCOUNTER — Other Ambulatory Visit: Payer: Self-pay | Admitting: Pulmonary Disease

## 2013-07-17 DIAGNOSIS — R040 Epistaxis: Secondary | ICD-10-CM

## 2013-07-17 NOTE — Telephone Encounter (Signed)
SN is aware. 

## 2013-07-17 NOTE — Telephone Encounter (Signed)
I spoke with pt. She reports her nose was bleeding almost all day yesterday. Finally it stopped and then it restarted again at 6:30 AM and not stopped bleeding since. She has been wetting sterile gauze and placing in her nose and is having to change it every 10 minutes d/t the amount of blood it has soaked in it. I advised pt she needed to go to the ED for an eval since it i still continue to bleed that much. She will do so. I will forward to SN as an [place]

## 2013-07-24 ENCOUNTER — Encounter: Payer: Self-pay | Admitting: Cardiovascular Disease

## 2013-07-24 ENCOUNTER — Ambulatory Visit (INDEPENDENT_AMBULATORY_CARE_PROVIDER_SITE_OTHER): Payer: Medicare Other | Admitting: Cardiovascular Disease

## 2013-07-24 VITALS — BP 122/60 | HR 81 | Ht 62.0 in | Wt 106.4 lb

## 2013-07-24 DIAGNOSIS — I080 Rheumatic disorders of both mitral and aortic valves: Secondary | ICD-10-CM

## 2013-07-24 DIAGNOSIS — I4891 Unspecified atrial fibrillation: Secondary | ICD-10-CM

## 2013-07-24 DIAGNOSIS — E7800 Pure hypercholesterolemia, unspecified: Secondary | ICD-10-CM

## 2013-07-24 NOTE — Assessment & Plan Note (Signed)
Severe with multiple previous episodes of clinical CHF Stable on current meds and dose of lasix F/U [facility] in December for ongoing mitral clip evaluation

## 2013-07-24 NOTE — Assessment & Plan Note (Signed)
Cholesterol is at goal.  Continue current dose of statin and diet Rx.  No myalgias or side effects.  F/U  LFT's in 6 months. Lab Results  Component Value Date   LDLCALC 89 03/10/2012

## 2013-07-24 NOTE — Patient Instructions (Addendum)
Your physician recommends that you schedule a follow-up appointment in: January  2015  WITH DR [facility]  Your physician recommends that you continue on your current medications as directed. Please refer to the Current Medication list given to you today.

## 2013-07-24 NOTE — Progress Notes (Signed)
Patient ID: Erin Rivers, female   DOB: [birth year], 79 y.o.   MRN: [id number] Erin Rivers is a 79 y.o. female who returns for follow up after a recent admission to the hospital with a/c diastolic CHF in the setting of severe mitral regurgitation 6/24-6/27. She has a hx of mitral regurgitation, diastolic CHF and permanent atrial fibrillation. She is on chronic Coumadin therapy. She presented to the hospital with increasing shortness of breath, orthopnea and PND. She improved clinically with diuresis with IV Lasix. 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 She is not a surgical candidate. Rerred  her to [facility] for mitral valve clipping. Scheduled for next month  Of note, patient did have bradycardia and her digoxin and beta blocker doses were reduced. Since d/c, she is doing well. Denies significant dyspnea. No CP. No syncope. No orthopnea, PND, edema. She has an appointment with Dr. Earma Reading at [facility] in December  She has been seen there a couple of times No records. Apparantly Dr Silvestre Mesi evaluated and agreed she is not a surgical candidate Dr Modesto Charon thought she was doing well on meds and should wait on procedure.    Labs (6/14): K 4.1, Cr 0.74, proBNP 2904, THS 3.907  Labs (7/14): K 4.3, Cr 0.8, proBNP 548, Hgb 11      ROS: Denies fever, malais, weight loss, blurry vision, decreased visual acuity, cough, sputum, SOB, hemoptysis, pleuritic pain, palpitaitons, heartburn, abdominal pain, melena, lower extremity edema, claudication, or rash.  All other systems reviewed and negative  General: Affect appropriate Frail elderly female HEENT: normal Neck supple with no adenopathy JVP normal no bruits no thyromegaly Lungs clear with no wheezing and good diaphragmatic motion Heart:  S1/S2 loud MR murmur, no rub, gallop or click PMI normal Abdomen: benighn, BS positve, no tenderness, no AAA no  bruit.  No HSM or HJR Distal pulses intact with no bruits No edema Neuro non-focal Skin warm and dry No muscular weakness   Current Outpatient Prescriptions  Medication Sig Dispense Refill  . Ascorbic Acid (VITAMIN C) 500 MG tablet Take 500 mg by mouth daily.        Marland Kitchen aspirin 81 MG tablet Take 81 mg by mouth daily.        . Calcium Carbonate-Vitamin D (CALTRATE 600+D) 600-400 MG-UNIT per tablet Take 1 tablet by mouth daily.        . Cholecalciferol (VITAMIN D) 1000 UNITS capsule Take 1,000 Units by mouth daily.        . digoxin (LANOXIN) 0.125 MG tablet Take 0.5 tablets (0.0625 mg total) by mouth daily.  30 tablet  11  . feeding supplement (ENSURE COMPLETE) LIQD Take 237 mLs by mouth daily.      . furosemide (LASIX) 40 MG tablet Take 1 tablet (40 mg total) by mouth 2 (two) times daily.  180 tablet  1  . ibandronate (BONIVA) 150 MG tablet Take 150 mg by mouth every 30 (thirty) days. Take in the morning with a full glass of water, on an empty stomach, and do not take anything else by mouth or lie down for the next 30 min.      Marland Kitchen levothyroxine (SYNTHROID, LEVOTHROID) 75 MCG tablet Take 75 mcg by mouth daily before breakfast.      . metoprolol (LOPRESSOR) 25 MG tablet Take 0.5 tablets (12.5 mg total) by mouth 2 (two) times daily.  45 tablet  11  .  potassium chloride SA (KLOR-CON M20) 20 MEQ tablet Take by mouth. 1 tablet in the AM and 1/2 Tablet in the PM      . Probiotic Product (PRO-FLORA CONCENTRATE) CAPS Take 1 capsule by mouth daily.        . simvastatin (ZOCOR) 40 MG tablet Take 1 tablet (40 mg total) by mouth at bedtime.  30 tablet  11  . traMADol (ULTRAM) 50 MG tablet Take 1 tablet (50 mg total) by mouth 3 (three) times daily as needed for pain.  90 tablet  0  . vitamin E 400 UNIT capsule Take 400 Units by mouth daily.        Marland Kitchen warfarin (COUMADIN) 2.5 MG tablet Take 1.25-2.5 mg by mouth at bedtime. Takes 1/2 tablet (1.25mg ) on Monday, Wednesday, Friday, and Saturday and 1 tablet (2.5mg )  on Sunday, Tuesday, and Thursday. Seen at [facility].       No current facility-administered medications for this visit.    Allergies  Codeine  Electrocardiogram:  afib nonspecfic STT wave changes rate 75  Assessment and Plan

## 2013-07-24 NOTE — Assessment & Plan Note (Signed)
Good rate control and anticoagulation.  Recent nose bleeds with cautery by Dr Jenne Pane.  Stable

## 2013-07-25 ENCOUNTER — Telehealth: Payer: Self-pay | Admitting: Pulmonary Disease

## 2013-07-25 MED ORDER — POTASSIUM CHLORIDE CRYS ER 20 MEQ PO TBCR: EXTENDED_RELEASE_TABLET | ORAL | Status: DC

## 2013-07-25 NOTE — Telephone Encounter (Signed)
Pt spouse aware rx has been sent for potassium. Nothing further needed

## 2013-07-26 ENCOUNTER — Other Ambulatory Visit: Payer: Self-pay | Admitting: Cardiovascular Disease

## 2013-08-06 ENCOUNTER — Encounter (HOSPITAL_COMMUNITY): Payer: Self-pay | Admitting: Emergency Medicine

## 2013-08-06 ENCOUNTER — Emergency Department (INDEPENDENT_AMBULATORY_CARE_PROVIDER_SITE_OTHER)
Admission: EM | Admit: 2013-08-06 | Discharge: 2013-08-06 | Disposition: A | Discharge status: Home / Self Care | Payer: Medicare Other | Source: Home / Self Care | Attending: Emergency Medicine | Admitting: Emergency Medicine

## 2013-08-06 DIAGNOSIS — I5033 Acute on chronic diastolic (congestive) heart failure: Secondary | ICD-10-CM

## 2013-08-06 DIAGNOSIS — Z5181 Encounter for therapeutic drug level monitoring: Secondary | ICD-10-CM

## 2013-08-06 DIAGNOSIS — I059 Rheumatic mitral valve disease, unspecified: Secondary | ICD-10-CM

## 2013-08-06 DIAGNOSIS — I4891 Unspecified atrial fibrillation: Secondary | ICD-10-CM

## 2013-08-06 DIAGNOSIS — H9190 Unspecified hearing loss, unspecified ear: Secondary | ICD-10-CM

## 2013-08-06 DIAGNOSIS — M810 Age-related osteoporosis without current pathological fracture: Secondary | ICD-10-CM

## 2013-08-06 DIAGNOSIS — E039 Hypothyroidism, unspecified: Secondary | ICD-10-CM

## 2013-08-06 DIAGNOSIS — R040 Epistaxis: Secondary | ICD-10-CM

## 2013-08-06 DIAGNOSIS — I10 Essential (primary) hypertension: Secondary | ICD-10-CM

## 2013-08-06 DIAGNOSIS — Z7901 Long term (current) use of anticoagulants: Secondary | ICD-10-CM

## 2013-08-06 DIAGNOSIS — I080 Rheumatic disorders of both mitral and aortic valves: Secondary | ICD-10-CM

## 2013-08-06 DIAGNOSIS — M545 Low back pain: Secondary | ICD-10-CM

## 2013-08-06 LAB — PROTIME-INR
INR: 2.27 — ABNORMAL HIGH (ref 0.00–1.49)
Prothrombin Time: 24.3 seconds — ABNORMAL HIGH (ref 11.6–15.2)

## 2013-08-06 NOTE — ED Provider Notes (Signed)
CSN: [id number]     Arrival date & time 08/06/13  4098 History   First MD Initiated Contact with Patient 08/06/13 1136     Chief Complaint  Patient presents with  . Epistaxis   (Consider location/radiation/quality/duration/timing/severity/associated sxs/prior Treatment) HPI Comments: Pt on coumadin, last had INR checked 5 weeks ago and it was fine, is due to have rechecked in a week.   Pt with long standing issues with nosebleeds.  2 weeks ago, pt had bleeding areas cauterized by Dr. Jenne Pane with [facility]. No subsequent issues with bleeding until yesterday.  Nose (primarily right side) has been bleeding on and off.  Relieved by direct pressure, but then returns again later.  This morning woke up with worse bleeding, which continues despite direct pressure. Contacted Dr. Jenne Pane office and he was not available so she was directed here.   Patient is a 79 y.o. female presenting with nosebleeds. The history is provided by the patient.  Epistaxis Location:  Bilateral Duration:  1 day Timing:  Intermittent Progression:  Worsening Chronicity:  Recurrent Context: anticoagulants   Relieved by:  Applying pressure Worsened by:  Movement Ineffective treatments:  Applying pressure Associated symptoms: no dizziness     Past Medical History  Diagnosis Date  . MVP (mitral valve prolapse)   . Hyperlipidemia   . Hypertension   . Osteoporosis   . Anemia   . Hypothyroid   . Atrial fibrillation     permanent; coumadin Rx  . Mitral regurgitation     a. not a surgical candidate;  b. 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 Hypercholesteremia   . Pneumonia   . Hearing loss   . Allergic rhinitis   . Chronic low back pain   . Shingles   . Chronic diastolic heart failure    Past Surgical History  Procedure Laterality Date   . Myringotomies     Family History  Problem Relation Age of Onset  . Stroke Mother   . Cancer Father   . Heart disease Brother     s/p MI   History  Substance Use Topics  . Smoking status: Former Smoker -- 10 years    Types: Cigarettes    Quit date: 09/13/1968  . Smokeless tobacco: Never Used     Comment: only smoked 1 cig per day  . Alcohol Use: Yes     Comment: 1 glass of wine with dinner   OB History   Grav Para Term Preterm Abortions TAB SAB Ect Mult Living                 Review of Systems  HENT: Positive for nosebleeds.   Neurological: Negative for dizziness.    Allergies  Codeine  Home Medications   Current Outpatient Rx  Name  Route  Sig  Dispense  Refill  . Ascorbic Acid (VITAMIN C) 500 MG tablet   Oral   Take 500 mg by mouth daily.           Marland Kitchen aspirin 81 MG tablet   Oral   Take 81 mg by mouth daily.           . Calcium Carbonate-Vitamin D (CALTRATE 600+D) 600-400 MG-UNIT per tablet   Oral   Take 1 tablet by mouth daily.           Marland Kitchen  Cholecalciferol (VITAMIN D) 1000 UNITS capsule   Oral   Take 1,000 Units by mouth daily.           . digoxin (LANOXIN) 0.125 MG tablet   Oral   Take 0.5 tablets (0.0625 mg total) by mouth daily.   30 tablet   11   . feeding supplement (ENSURE COMPLETE) LIQD   Oral   Take 237 mLs by mouth daily.         . furosemide (LASIX) 40 MG tablet   Oral   Take 1 tablet (40 mg total) by mouth 2 (two) times daily.   180 tablet   1   . ibandronate (BONIVA) 150 MG tablet   Oral   Take 150 mg by mouth every 30 (thirty) days. Take in the morning with a full glass of water, on an empty stomach, and do not take anything else by mouth or lie down for the next 30 min.         Marland Kitchen levothyroxine (SYNTHROID, LEVOTHROID) 75 MCG tablet   Oral   Take 75 mcg by mouth daily before breakfast.         . metoprolol (LOPRESSOR) 25 MG tablet   Oral   Take 0.5 tablets (12.5 mg total) by mouth 2 (two) times daily.   45  tablet   11   . potassium chloride SA (KLOR-CON M20) 20 MEQ tablet      1 tablet in the AM and 1/2 Tablet in the PM   180 tablet   3   . Probiotic Product (PRO-FLORA CONCENTRATE) CAPS   Oral   Take 1 capsule by mouth daily.           . simvastatin (ZOCOR) 40 MG tablet   Oral   Take 1 tablet (40 mg total) by mouth at bedtime.   30 tablet   11   . traMADol (ULTRAM) 50 MG tablet   Oral   Take 1 tablet (50 mg total) by mouth 3 (three) times daily as needed for pain.   90 tablet   0   . vitamin E 400 UNIT capsule   Oral   Take 400 Units by mouth daily.           Marland Kitchen warfarin (COUMADIN) 2.5 MG tablet      TAKE 1 TABLET AS DIRECTED   90 tablet   1     Take as directed by coumadin clinic    BP 124/80  Pulse 92  Temp(Src) 98.1 F (36.7 C) (Oral)  Resp 20  SpO2 96% Physical Exam  Constitutional: She appears well-developed and well-nourished. No distress.  HENT:  Nose: Epistaxis is observed.  Bleeding primarily from R nare, a tiny amount from L nare    ED Course  Procedures (including critical care time) Labs Review Labs Reviewed  PROTIME-INR - Abnormal; Notable for the following:    Prothrombin Time 24.3 (*)    INR 2.27 (*)    All other components within normal limits   Imaging Review No results found.  EKG Interpretation    Date/Time:    Ventricular Rate:    PR Interval:    QRS Duration:   QT Interval:    QTC Calculation:   R Axis:     Text Interpretation:             Afrin sprayed into B nares and direct pressure applied for 10 minutes.  This slowed but did not stop the bleeding.  Afrin applied to  cotton balls placed in B nares for 10 minutes.  This abated the L nare bleeding, but the R nare continued to trickle.  There is a blood clot in R nare. Dr. Lorenz Coaster removed this and it turned out to be very large.  Afrin again sprayed into R nare.  This has completely stopped bleeding.  Bacitracin ointment applied internally B.  MDM   1. Epistaxis     Pt to f/u with Dr. Jenne Pane tomorrow.     Cathlyn Parsons, NP 08/06/13 1205

## 2013-08-06 NOTE — ED Provider Notes (Signed)
Medical screening examination/treatment/procedure(s) were performed by non-physician practitioner and as supervising physician I was immediately available for consultation/collaboration.  Leslee Home, M.D.  Reuben Likes, MD 08/06/13 [phone]

## 2013-08-06 NOTE — ED Notes (Addendum)
Hist of nosebleed x 2 weeks; seen by Dr Jenne Pane; restarted last PM, bleeding off and on since then; states majority of  bleeding from right nostril. Direct pressure to anterior nasal area x 10 min controlled bleeding , but when pressure d/c'd clot removed, started bleeding again

## 2013-08-07 ENCOUNTER — Telehealth: Payer: Self-pay | Admitting: Cardiovascular Disease

## 2013-08-07 NOTE — Telephone Encounter (Signed)
New problem    Dr. Jenne Pane is aware that [name] in the cath lab this am .    1. Can patient come off coumadin for a week or so due to nose bleeds.

## 2013-08-07 NOTE — Telephone Encounter (Signed)
Dr Lamount Cranker  OFFICE  AWARE PT  MAY  COUMADIN FOR 1 WEEK PER DR Eden Emms DR [facility]  OFFICE TO INFORM PT .Zack Seal

## 2013-08-07 NOTE — Telephone Encounter (Signed)
MESSAGE FORWARDED TO  DR NISHAN./CY

## 2013-08-07 NOTE — Telephone Encounter (Signed)
Yes she can come off for a week for nose bleeds

## 2013-08-10 ENCOUNTER — Ambulatory Visit (INDEPENDENT_AMBULATORY_CARE_PROVIDER_SITE_OTHER): Payer: Medicare Other | Admitting: Pharmacist

## 2013-08-10 DIAGNOSIS — I4891 Unspecified atrial fibrillation: Secondary | ICD-10-CM

## 2013-08-10 DIAGNOSIS — Z7901 Long term (current) use of anticoagulants: Secondary | ICD-10-CM

## 2013-08-10 LAB — POCT INR: INR: 1.3

## 2013-08-14 ENCOUNTER — Encounter: Payer: Self-pay | Admitting: Cardiovascular Disease

## 2013-08-24 ENCOUNTER — Other Ambulatory Visit: Payer: Self-pay | Admitting: Pulmonary Disease

## 2013-08-27 ENCOUNTER — Ambulatory Visit (INDEPENDENT_AMBULATORY_CARE_PROVIDER_SITE_OTHER): Payer: Medicare Other | Admitting: Pharmacist

## 2013-08-27 DIAGNOSIS — I4891 Unspecified atrial fibrillation: Secondary | ICD-10-CM

## 2013-08-27 DIAGNOSIS — Z7901 Long term (current) use of anticoagulants: Secondary | ICD-10-CM

## 2013-08-27 LAB — POCT INR: INR: 1.8

## 2013-08-28 ENCOUNTER — Encounter: Payer: Self-pay | Admitting: Pulmonary Disease

## 2013-08-28 ENCOUNTER — Ambulatory Visit (INDEPENDENT_AMBULATORY_CARE_PROVIDER_SITE_OTHER): Payer: Medicare Other | Admitting: Pulmonary Disease

## 2013-08-28 VITALS — BP 104/60 | HR 84 | Temp 97.4°F | Ht 62.0 in | Wt 108.0 lb

## 2013-08-28 DIAGNOSIS — I059 Rheumatic mitral valve disease, unspecified: Secondary | ICD-10-CM

## 2013-08-28 DIAGNOSIS — H9190 Unspecified hearing loss, unspecified ear: Secondary | ICD-10-CM

## 2013-08-28 DIAGNOSIS — M545 Low back pain, unspecified: Secondary | ICD-10-CM

## 2013-08-28 DIAGNOSIS — R040 Epistaxis: Secondary | ICD-10-CM

## 2013-08-28 DIAGNOSIS — I4891 Unspecified atrial fibrillation: Secondary | ICD-10-CM

## 2013-08-28 DIAGNOSIS — I5033 Acute on chronic diastolic (congestive) heart failure: Secondary | ICD-10-CM

## 2013-08-28 DIAGNOSIS — E039 Hypothyroidism, unspecified: Secondary | ICD-10-CM

## 2013-08-28 DIAGNOSIS — M810 Age-related osteoporosis without current pathological fracture: Secondary | ICD-10-CM

## 2013-08-28 DIAGNOSIS — Z7901 Long term (current) use of anticoagulants: Secondary | ICD-10-CM

## 2013-08-28 MED ORDER — SIMVASTATIN 40 MG PO TABS: 40.0000 mg | ORAL_TABLET | Freq: Every day | ORAL | Status: AC

## 2013-08-28 MED ORDER — WARFARIN SODIUM 2.5 MG PO TABS: ORAL_TABLET | ORAL | Status: DC

## 2013-08-28 NOTE — Patient Instructions (Signed)
Today we updated your med list in our EPIC system...    Continue your current medications the same...  Good luck w/ your follow up appt at [facility]...  Call for any questions...  Let's plan a follow up visit in 63mo, sooner if needed for problems.Marland KitchenMarland Kitchen

## 2013-08-28 NOTE — Progress Notes (Signed)
Subjective:    Patient ID: Erin Rivers, female    DOB: 11/27/1920, 79 y.o.   MRN: [id number]  HPI 79 y/o WF here for a follow up visit... she has multiple medical problems including:  CHF w/ severe MR and chr AFib, Hyperlipidemia, Hypothyroid, Osteopenia, etc... she is followed for Cards by Erin Rivers.  ~  March 13, 2012:  39mo ROV & Erin Rivers's CC is some left arm pain just above the elbow> I offered Ortho eval & Rx but she would prefer to try rest, heat & Tramadol first... She & her husb had another great trip to [place] in [place] several months ago & they did quite well, also went to [facility] on the return leg of the voyage...    We reviewed prob list, meds, xrays and labs> see below>> LABS 7/13:  FLP- at goals on Simva40;  Chems- wnl;  CBC- Hg=11.5 MCV=94;  BNP=584  ~  September 20, 2012:  39mo ROV & she ha had a busy interval- moved to [place] [place], grandson got married, Thanksgiving, Christmas, etc...  We reviewed the following medical problems during today's office visit>>     HOH> she has hearing aides bilat, canals are clear but she notes incr difficulty hearing & advised to f/u w/ Audiology...    Hx +PPD> no known Tb exposure; she denies cough, sputum, SOB, hemoptysis, etc; last CXR 8/13 remained clear, NAD...    MVP, severeMR, DiastolicCHF> on ASA81, Metop50Bid, Lasix40-1.5tabs/d, K20/d; went to ER 8/13 for sharp fleeting atypCP- neg ER eval & was reassured...    ChrAFib> on Coumadin in CC & Lanoxin0.125; followed by Erin Rivers on Coumadin & rate control- stable...    Hyperlipid> on Simva40; last FLP 6/13 revealed TChol 161, TG 125, HDL 47, LDL 89    Hypothy> on Levothy75; last TSH 1/13 was 2.05    LBP, Osteopenia> on Boniva150/mo, MVI, VitD; doing satis w/ exercise program; last BMD 2009 w/ TScore -1.8 in spine & we discussed f/u BMD...    Anemia> she takes vitamins & a probiotic daily; Hg is stable in the 11-12 range... We reviewed prob list, meds, xrays and labs> see below for updates >>   ~   March 21, 2013:  39mo ROV & post hosp check> [name] was seen 5/14 by Erin Rivers for f/u of her chronic AFib, severe biatrial enlargement w/ severe MR/ myxomatous valve w/ bileaflet prolapse & preserved LVF (EF=55%)- felt to be stable on her Metop50Bid, Lasix40-1.5tabs/d, K20/d, Lanoxin0.125, & Coumadin (no changes made)...  She was hosp 6/24 - 03/09/13 by [facility] w/ findings of acute on chronic diastolic heart failure after presenting w/ incr SOB>  Exam showed rales in lungs;  CXR showed cardiomeg & new vasc congestion w/ sm right effusion;  EKG revealed chr AFib, rate80, lat STTW abn;  2DEcho was repeated w/ good LVF, EF=55%, severe myxomatous MV w/ bileaflet prolapse & severe MR, LA&RA mod to severely dilated, modTR, mildAI, peak PAsys=66;   Labs revealed Chems wnl & Cr=0.74, BNP 2900=>584, Dig level=1.4 on the 0.125mg /d dose...  She diuresed off several lbs & improved;  She had some bradycardia on the monitor but was asymptomatic- they decreased her BBlocker & Digoxin doses;  She was underweight & encouraged to take Ensure/ boost/ etc daily;  She was seen by [facility] & rec to consider Mitral Valve clip procedure at [facility] by DrWong/ [place]- appt arranged for 8/14;  She was disch on similar meds but less Metop25-1/2Bid & Lanoxin0.125-1/2 daily, w/ Lasix at 40Bid.Rivers KitchenMarland Kitchen  Since disch she has improved, stabilized> CXR today shows cardiomeg, improved aeration & resolved edema;  Labs today reveal normal chems, mild anemia= 11.0, BNP= 548...     We reviewed prob list, meds, xrays and labs> see below for updates >> we broached the subject of a MOST form today but she is not ready to discuss due to up-coming [facility] eval for poss surg...  CXR 7/14 showed cardiomeg, no infiltrate or edema, DJD in Tspine...  LABS 7/14:  Chems- ok w/ Cr=0.8;  CBC- Hg=11.0;  BNP=548...   ~  May 23, 2013:  31mo ROV & [name] has turned 79 y/o & really looks good- she went to [facility] 9/14 & saw DrBashore w/ severe MR due to bileaflet prolapse, they were  not certain that her anatomy would be amenable to the MitraClip & wanted to do a TEE w/ DrGlower & this is sched for next week (to help decide HeartPort MVrepair vs MitraClip...  She had a fabulous 89th birthday w/ family at [facility] she has been drinking Boost supplements & has gained 4-5# w/o edema or rales, we reviewed the following medical problems during today's office visit >>     HOH> she has hearing aides bilat, canals are clear but she notes incr difficulty hearing & advised to f/u w/ Audiology...    Hx +PPD> no known Tb exposure; she denies cough, sputum, SOB, hemoptysis, etc; last CXR 7/14 showed cardiomegaly, clear lungs, degen changes in Tspine...    MVP, severeMR, DiastolicCHF> on ASA81, Metop50-1/2Bid, Lasix40Bid, K20/d; she was hosp 6/14 as noted- diuresed, meds adjusted, improved; went to [facility] 8/14 & TEE pending before deciding about MitraClip...    ChrAFib> on Coumadin in CC & Lanoxin0.125-1/2 daily; followed by Delray Alt on Coumadin & rate control- stable...    Hyperlipid> on Simva40- last FLP 6/13 revealed TChol 161, TG 125, HDL 47, LDL 89; continue same...    Hypothy> on Levothy75; last TSH 6/14 was 3.91    LBP, Osteopenia> on Boniva150/mo, MVI, VitD; doing satis w/ exercise program; last BMD 2009 w/ TScore -1.8 in spine & we discussed f/u BMD...    Anemia> she takes vitamins & a probiotic daily; Hg is stable in the 11-12 range... We reviewed prob list, meds, xrays and labs> see below for updates >> Given the 2014 Flu vaccine...   ~  August 28, 2013:  33mo ROV & Erin Rivers has had some nosebleeds (see below) and on-going eval for her severe MR by [place] and [facility]...     She had several nosebleeds and eval by DrBates> Coumadin held for awhile and he placed a pack & cauterized ant nasal septum but no obvious site of bleeding found; he considered embolization but this wasn't necessary, bleeding stopped & back on Coumadin...    She saw DrNishan 11/14> hx AFib and acute on chr  diastolic CHF in the setting of severe MR; 2DEcho w/ severe myxomatous MV w/ bileaflet prolapse, severe MR, severe LAE, mod RAE & TR, PAsys=66; not felt to be an operative cand she has been seen at [facility] to consider MVClip (DrBashore, DrGlower) and she has up-coming recheck soon...     She is stable on Metop25-1/2Bid, Lasix40Bid, K20-1Qam & 1/2Qpm, Lanoxin.125-1/2, Coumadin, Simva40, Synthroid75...            Problem List:    HEARING LOSS (ICD-389.9) - she wears digital hearing aides...  ALLERGIC RHINITIS (ICD-477.9) - uses OTC meds Prn...  Hx of POSITIVE PPD (ICD-795.5) - no known Tb exposure.Rivers KitchenMarland Kitchen  asymptomatic without cough, sputum, hemoptysis, etc... 11/11 c/o mild dyspnea but more trouble getting air "in"... ~  CXR 11/09 mild cardiomeg, atherosclerotic Ao, clear lungs, mild scoliosis. ~  CXR 11/10 showed similar- cardiomeg, sl incr markings, NAD. ~  CXR 11/11 showed similar cardiomeg but R>L basilar airsp dis vs atx vs effusion (improved w/ diuresis). ~  CXR 8/13 showed cardiomegaly, no edema, clear lungs, NAD.Rivers Kitchen. ~  CXR 6/14 hosp showed cardiomeg, pulm vasc congestion, tiny right effusion... ~  CXR 7/14 in office showed resolution of the edema (cardiomegaly, clear lungs, degen changes in Tspine).  MITRAL VALVE PROLAPSE (ICD-424.0) -  MITRAL REGURGITATION (ICD-396.3) -  DIASTOLIC CHF -  ATRIAL FIBRILLATION, CHRONIC (ICD-427.31) - she was followed by Erin Rivers for Cardiology- his notes are reviewed... AFib dx in 2002 treated w/ rate control on METOPROLOL 50mg Bid, ASA 81mg /d and COUMADIN- followed in the coumadin clinic... l2DEcho 6/08 showed bileaflet MVP w/ mod to severe MR...  ~  5/11:  she remains asymptomatic without CP, palpit, dizziness, syncope, dyspnea, or edema... no symptoms of TIA/ stroke... she remains very active and doing well- walks >69mi per day... ~  11/11:  presents w/ incr SOB- w/ activ & at rest, can't get a DB, sighs help, min cough, no sputum, no f/c/s, feels "tight" but no  wheezing... **NOTE> CXR w/ cardiomeg & basilar edema, Labs w/ Hg=10.9, BNP>1000... Adm for diuresis- 2DEcho showed mild LVH, EF=55-60%, no regional wall motion abn, severe MR, pulmHTN w/ PAsys=55... disch on DIGOXIN 0.125mg /d & LASIX 40mg /d... ~  Post hosp check 11/11:  improved overall, Dig= 1.6, BNP= 822, Creat= 0.9> rec incr Lasix to 60mg /d & K20 to 1.5 tabs/d as well. ~  Labs 3/12 showed BNP= 532 ~  6/12:  Follow up improved, had great trip to [place] w/o complic & stable on Lasix 60mg /d w/ BNP= 430... ~  9/12:  DrNishan reiterated that she is not an operative candidate (severe MR) but she is compensated & not yet ready to talk about NCB, DNR, Hospice, etc... ~  1/13:  She reports trip to [facility] & did satis; stable overall & BNP= 636 on her Lasix 60mg /d... ~  7/13:  She had great trip to [place] [employer] [place]; BNP= 584 on Lasix60... ~  8/13:  EKG showed AFib, rate65, NSSTTWA; presented to ER w/ fleeting sharp AtypCP, neg eval  & was reassured... ~  6/14:  Hosp w/ diastolic CHF- EKG showed chr AFib, rate80, lat STTW abn; & 2DEcho showed good LVF, EF=55%, severe myxomatous MV w/ bileaflet prolapse & severe MR, LA&RA mod to severely dilated, modTR, mildAI, peak PAsys=66... ~  8/14:  Eval at [facility] w/ severe MR due to bileaflet prolapse, they were not certain that her anatomy would be amenable to the MitraClip vs HeartPort MVrepair & TEE is pending... ~  She saw DrNishan 11/14> hx AFib and acute on chr diastolic CHF in the setting of severe MR; 2DEcho w/ severe myxomatous MV w/ bileaflet prolapse, severe MR, severe LAE, mod RAE & TR, PAsys=66; not felt to be an operative cand she has been seen at [facility] to consider MVClip (DrBashore, DrGlower) and she has up-coming recheck soon...   HYPERLIPIDEMIA (ICD-272.4) - on SIMVASTATIN 40mg /d... ~  FLP 5/09 showed TChol 181, TG 95, HDL 54, LDL 108... same med, & low fat diet. ~  FLP 5/10 showed TChol 168, TG 86, HDL 57, LDL 94 ~  FLP 5/11 showed TChol 170,  TG 94, HDL 57, LDL 94 ~  FLP 3/12  showed TChol 150, Tg 118, HDL 43, LDL 84 ~  FLP 6/13 on Simva40 showed TChol 161, TG 125, HDL 47, LDL 89  HYPOTHYROIDISM (ICD-244.9) - now on SYNTHROID 27mcg/d... clinically euthyroid & asymptomatic... ~  labs 5/09 showed TSH= 3.71... continue same med. ~  labs 5/10 showed TSH= 3.50 ~  labs 5/11 showed TSH= 5.36 ~  labs in hosp 11/11 showed TSH= 7.77 but FreeT3 & FreeT4 were WNL, they incr Synthroid to 47mcg/d anyway & we will watch HR & recheck. ~  Labs 3/12 on Synthroid75 showed TSH= 0.75 ~  Labs 1/13 showed TSH= 2.05 ~  Labs 6/14 on Synthroid75 showed TSH= 3.91  LOW BACK PAIN, CHRONIC (ICD-724.2) - she has seen a chiropractor in the past, and used Vicodin, Tramadol... currently doing satis w/ her exercise program and using OTC meds only as needed.  OSTEOPOROSIS (ICD-733.00) - prev on Actonel 35mg /wk, but changed to BONIVA 150mg /mo per new insurance co ([employer]); plus Calcium/ Vits/ etc... ~  BMD 11/07 showed TScores -1.5 sp, and -1.3 hips... sl improved from 2004 on rx. ~  BMD 12/09 showed TScores -1.8 Spine, & -1.0 left FemNeck... continue Boniva. ~  Labs 1/13 showed Vit D level = 58... rec to continue VitD 1000u daily.  ANEMIA (ICD-285.9) ~  labs 5/10 showed Hg= 12.5, MCV= 102 ~  labs 5/11 showed Hg= 12.5, MCV= 99 ~  11/11:  presented w/ dyspnea & Hg=10.9 ?etiology, noanemia w/u done in hosp as f/u labs showed Hg=12-13. ~  Labs 3/12 showed Hg= 12.1 ~  Labs 1/13 showed Hg= 11.6 ~  Labs 7/13 showed Hg= 11.5 ~  Labs 6/14-7/14 showed Hg= 11.0  SHINGLES, HX OF (ICD-V13.8) - we briefly discussed the shingles vaccine...  Health Maint:  she has never had a colonoscopy (now in her 89's & on Coumadin); no hx of GI problems & Hg= 13.0 (5/09) & 12.5 (5/10)... she gets the seasonal Flu vaccine yearly.   Past Surgical History  Procedure Laterality Date  . Myringotomies      Outpatient Encounter Prescriptions as of 08/28/2013  Medication Sig  .  Ascorbic Acid (VITAMIN C) 500 MG tablet Take 500 mg by mouth daily.    Rivers Kitchen aspirin 81 MG tablet Take 81 mg by mouth daily.    . Calcium Carbonate-Vitamin D (CALTRATE 600+D) 600-400 MG-UNIT per tablet Take 1 tablet by mouth daily.    . Cholecalciferol (VITAMIN D) 1000 UNITS capsule Take 1,000 Units by mouth daily.    . digoxin (LANOXIN) 0.125 MG tablet Take 0.5 tablets (0.0625 mg total) by mouth daily.  . feeding supplement (ENSURE COMPLETE) LIQD Take 237 mLs by mouth daily.  . furosemide (LASIX) 40 MG tablet Take 1 tablet (40 mg total) by mouth 2 (two) times daily.  Rivers Kitchen ibandronate (BONIVA) 150 MG tablet TAKE AS INSTRUCTED BY YOUR PRESCRIBER  . levothyroxine (SYNTHROID, LEVOTHROID) 75 MCG tablet Take 75 mcg by mouth daily before breakfast.  . metoprolol (LOPRESSOR) 25 MG tablet Take 0.5 tablets (12.5 mg total) by mouth 2 (two) times daily.  . potassium chloride SA (KLOR-CON M20) 20 MEQ tablet 1 tablet in the AM and 1/2 Tablet in the PM  . Probiotic Product (PRO-FLORA CONCENTRATE) CAPS Take 1 capsule by mouth daily.    . simvastatin (ZOCOR) 40 MG tablet Take 1 tablet (40 mg total) by mouth at bedtime.  . vitamin E 400 UNIT capsule Take 400 Units by mouth daily.    Rivers Kitchen warfarin (COUMADIN) 2.5 MG tablet TAKE 1 TABLET  AS DIRECTED  . [DISCONTINUED] ibandronate (BONIVA) 150 MG tablet Take 150 mg by mouth every 30 (thirty) days. Take in the morning with a full glass of water, on an empty stomach, and do not take anything else by mouth or lie down for the next 30 min.  . [DISCONTINUED] traMADol (ULTRAM) 50 MG tablet Take 1 tablet (50 mg total) by mouth 3 (three) times daily as needed for pain.    Allergies  Allergen Reactions  . Codeine     REACTION: Nausea/dizziness    Review of Systems        See HPI - all other systems neg except as noted...  The patient complains of dyspnea on exertion.  The patient denies anorexia, fever, weight loss, weight gain, vision loss, decreased hearing, hoarseness, chest  pain, syncope, peripheral edema, prolonged cough, headaches, hemoptysis, abdominal pain, melena, hematochezia, severe indigestion/heartburn, hematuria, incontinence, muscle weakness, suspicious skin lesions, transient blindness, difficulty walking, depression, unusual weight change, abnormal bleeding, enlarged lymph nodes, and angioedema.     Objective:   Physical Exam     WD, WN, 79 y/o WF in NAD... GENERAL:  Alert & oriented; pleasant & cooperative... HEENT:  [place]/AT, EOM-wnl, PERRLA, EACs-clear, TMs-wnl, NOSE-clear, THROAT-clear & wnl. NECK:  Supple w/ fairROM; no JVD; normal carotid impulses w/o bruits; no thyromegaly or nodules palpated; no lymphadenopathy. CHEST:  Decr BS bilat w/ few basilar rales & scat rhonchi, not coughing, no wheezing, no consolidation. HEART:  irreg AFib w/ control VR; gr 3/6 sys murmur at apex, no rubs or gallops... ABDOMEN:  Soft & nontender; normal bowel sounds; no organomegaly or masses detected. EXT: without deformities, mild arthritic changes; no varicose veins/ venous insuffic/ or edema. NEURO:  CN's intact; motor testing normal; sensory testing normal; gait normal & balance OK. DERM:  No lesions noted; no rash etc...  RADIOLOGY DATA:  Reviewed in the EPIC EMR & discussed w/ the patient...  LABORATORY DATA:  Reviewed in the EPIC EMR & discussed w/ the patient...    Assessment & Plan:    Adm 6/14 w/ Diastolic CHF>> meds adjusted w/ incr Lasix 40Bid & decr Metop & Digoxin as above; continue same...  S/P initial eval at [facility] for poss mitral valve clip...   DYSPNEA, multifactorial>  This is improved, diuresis has helped, rec to continue exercise...  AFib>  Followed by Erin Rivers, on coumadin in the CC, continue same meds...  HYPERLIPID>  Stable on Simva40 + diet etc...  HYPOTHYROID>  She remains stable on the Synthroid 8mcg/d...  DJD, LBP, Osteopenia>  Stable on her exerc program + Boniva etc...  Anemia>  Hg is stable ~11-12 range, continue vit  supplements & watch for any bleeding...   Patient's Medications  New Prescriptions   No medications on file  Previous Medications   ASCORBIC ACID (VITAMIN C) 500 MG TABLET    Take 500 mg by mouth daily.     ASPIRIN 81 MG TABLET    Take 81 mg by mouth daily.     CALCIUM CARBONATE-VITAMIN D (CALTRATE 600+D) 600-400 MG-UNIT PER TABLET    Take 1 tablet by mouth daily.     CHOLECALCIFEROL (VITAMIN D) 1000 UNITS CAPSULE    Take 1,000 Units by mouth daily.     DIGOXIN (LANOXIN) 0.125 MG TABLET    Take 0.5 tablets (0.0625 mg total) by mouth daily.   FEEDING SUPPLEMENT (ENSURE COMPLETE) LIQD    Take 237 mLs by mouth daily.   FUROSEMIDE (LASIX) 40 MG TABLET  Take 1 tablet (40 mg total) by mouth 2 (two) times daily.   IBANDRONATE (BONIVA) 150 MG TABLET    TAKE AS INSTRUCTED BY YOUR PRESCRIBER   LEVOTHYROXINE (SYNTHROID, LEVOTHROID) 75 MCG TABLET    Take 75 mcg by mouth daily before breakfast.   METOPROLOL (LOPRESSOR) 25 MG TABLET    Take 0.5 tablets (12.5 mg total) by mouth 2 (two) times daily.   POTASSIUM CHLORIDE SA (KLOR-CON M20) 20 MEQ TABLET    1 tablet in the AM and 1/2 Tablet in the PM   PROBIOTIC PRODUCT (PRO-FLORA CONCENTRATE) CAPS    Take 1 capsule by mouth daily.     VITAMIN E 400 UNIT CAPSULE    Take 400 Units by mouth daily.    Modified Medications   Modified Medication Previous Medication   SIMVASTATIN (ZOCOR) 40 MG TABLET simvastatin (ZOCOR) 40 MG tablet      Take 1 tablet (40 mg total) by mouth at bedtime.    Take 1 tablet (40 mg total) by mouth at bedtime.   WARFARIN (COUMADIN) 2.5 MG TABLET warfarin (COUMADIN) 2.5 MG tablet      TAKE 1 TABLET AS DIRECTED    TAKE 1 TABLET AS DIRECTED  Discontinued Medications   IBANDRONATE (BONIVA) 150 MG TABLET    Take 150 mg by mouth every 30 (thirty) days. Take in the morning with a full glass of water, on an empty stomach, and do not take anything else by mouth or lie down for the next 30 min.   TRAMADOL (ULTRAM) 50 MG TABLET    Take 1 tablet  (50 mg total) by mouth 3 (three) times daily as needed for pain.

## 2013-09-04 ENCOUNTER — Telehealth: Payer: Self-pay | Admitting: Pulmonary Disease

## 2013-09-04 ENCOUNTER — Telehealth: Payer: Self-pay | Admitting: Cardiovascular Disease

## 2013-09-04 NOTE — Telephone Encounter (Signed)
Left message to call back  

## 2013-09-04 NOTE — Telephone Encounter (Signed)
New Problem:  Pt states his wife went to [facility] yesterday regarding her mitra valve... Husband states the doctor there suggested she continue on her meds and not have any procedure done yet... Erin Rivers just wanted to make [name] aware. He'd also like a call back.

## 2013-09-04 NOTE — Telephone Encounter (Signed)
lmomtcb x1 for spouse 

## 2013-09-05 NOTE — Telephone Encounter (Signed)
SN is aware. 

## 2013-09-05 NOTE — Telephone Encounter (Signed)
Called, spoke with pt's spouse -  Pt was seen by Dr. Modesto Charon at [facility] on Monday to discuss valve procedure.  Reports Dr. Modesto Charon recs pt let the medication do it's job for now.  If in the future the med isn't working, they can regroup.  Will route to SN as FYI.

## 2013-09-05 NOTE — Telephone Encounter (Signed)
Spoke with pt husband, dr Modesto Charon saw the pt Monday and wanted to let dr Eden Emms know dr Modesto Charon did not think she needed surgery at this time. Will make dr Eden Emms aware.

## 2013-09-17 ENCOUNTER — Ambulatory Visit (INDEPENDENT_AMBULATORY_CARE_PROVIDER_SITE_OTHER): Payer: Medicare Other | Admitting: Pharmacist

## 2013-09-17 DIAGNOSIS — I4891 Unspecified atrial fibrillation: Secondary | ICD-10-CM

## 2013-09-17 DIAGNOSIS — Z7901 Long term (current) use of anticoagulants: Secondary | ICD-10-CM

## 2013-09-17 LAB — POCT INR: INR: 2

## 2013-09-18 NOTE — Telephone Encounter (Signed)
New Message   Pt husband called regarding the procedure that was delayed because of the appointment scheduled at [facility].Erin Rivers. Pt called to note that the appt was canceled at [facility] and now requests to schedule the Cataract surgery please call.

## 2013-09-18 NOTE — Telephone Encounter (Signed)
Ok to have cataract surgery 

## 2013-09-18 NOTE — Telephone Encounter (Signed)
WILL DISCUSS WITH DR Eden EmmsNISHAN  IF  PT  MAY  PROCEED WITH  CATARACT SURGERY .Zack Seal/CY

## 2013-09-20 NOTE — Telephone Encounter (Signed)
PT  AWARE MAY PROCEED  WITH  CATARACT  SURGERY .Zack Seal/CY

## 2013-10-19 ENCOUNTER — Ambulatory Visit (INDEPENDENT_AMBULATORY_CARE_PROVIDER_SITE_OTHER): Payer: Medicare Other | Admitting: Cardiovascular Disease

## 2013-10-19 ENCOUNTER — Ambulatory Visit (INDEPENDENT_AMBULATORY_CARE_PROVIDER_SITE_OTHER): Payer: Medicare Other | Admitting: *Deleted

## 2013-10-19 ENCOUNTER — Encounter: Payer: Self-pay | Admitting: Cardiovascular Disease

## 2013-10-19 VITALS — BP 127/69 | HR 86 | Ht 62.0 in | Wt 107.5 lb

## 2013-10-19 DIAGNOSIS — Z5181 Encounter for therapeutic drug level monitoring: Secondary | ICD-10-CM

## 2013-10-19 DIAGNOSIS — I4891 Unspecified atrial fibrillation: Secondary | ICD-10-CM

## 2013-10-19 DIAGNOSIS — I080 Rheumatic disorders of both mitral and aortic valves: Secondary | ICD-10-CM

## 2013-10-19 DIAGNOSIS — I10 Essential (primary) hypertension: Secondary | ICD-10-CM

## 2013-10-19 DIAGNOSIS — Z7901 Long term (current) use of anticoagulants: Secondary | ICD-10-CM

## 2013-10-19 LAB — POCT INR: INR: 2.5

## 2013-10-19 NOTE — Patient Instructions (Signed)
Your physician wants you to follow-up in:  6 MONTHS WITH DR NISHAN  You will receive a reminder letter in the mail two months in advance. If you don't receive a letter, please call our office to schedule the follow-up appointment. Your physician recommends that you continue on your current medications as directed. Please refer to the Current Medication list given to you today. 

## 2013-10-19 NOTE — Progress Notes (Signed)
Patient ID: Erin Rivers, female   DOB: [birth year], 79 y.o.   MRN: [id number] LOA IDLER is a 80 y.o. female who returns for follow up after a recent admission to the hospital with a/c diastolic CHF in the setting of severe mitral regurgitation 6/24-6/27. She has a hx of mitral regurgitation, diastolic CHF and permanent atrial fibrillation. She is on chronic Coumadin therapy. She presented to the hospital with increasing shortness of breath, orthopnea and PND. She improved clinically with diuresis with IV Lasix. 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 She is not a surgical candidate. Rerred her to [facility] for mitral valve clipping. Scheduled for next month Of note, patient did have bradycardia and her digoxin and beta blocker doses were reduced. Since d/c, she is doing well. Denies significant dyspnea. No CP. No syncope. No orthopnea, PND, edema. She has an appointment with Dr. Earma Reading at [facility] in December   She has been seen there a couple of times No records. Apparantly Dr Silvestre Mesi evaluated and agreed she is not a surgical candidate  Dr Modesto Charon thought she was doing well on meds and should wait on procedure.   Labs (6/14): K 4.1, Cr 0.74, proBNP 2904, THS 3.907  Labs (7/14): K 4.3, Cr 0.8, proBNP 548, Hgb 11  Doing well and wants to go home to [place] for a visit    ROS: Denies fever, malais, weight loss, blurry vision, decreased visual acuity, cough, sputum, SOB, hemoptysis, pleuritic pain, palpitaitons, heartburn, abdominal pain, melena, lower extremity edema, claudication, or rash.  All other systems reviewed and negative  General: Affect appropriate Frail thin female  HEENT: normal Neck supple with no adenopathy JVP normal no bruits no thyromegaly Lungs clear with no wheezing and good diaphragmatic motion Heart:  S1/S2 loud MR  murmur, no rub, gallop or click PMI  normal Abdomen: benighn, BS positve, no tenderness, no AAA no bruit.  No HSM or HJR Distal pulses intact with no bruits No edema Neuro non-focal Skin warm and dry No muscular weakness   Current Outpatient Prescriptions  Medication Sig Dispense Refill  . Ascorbic Acid (VITAMIN C) 500 MG tablet Take 500 mg by mouth daily.        Marland Kitchen aspirin 81 MG tablet Take 81 mg by mouth daily.        . Calcium Carbonate-Vitamin D (CALTRATE 600+D) 600-400 MG-UNIT per tablet Take 1 tablet by mouth daily.        . Cholecalciferol (VITAMIN D) 1000 UNITS capsule Take 1,000 Units by mouth daily.        . digoxin (LANOXIN) 0.125 MG tablet Take 0.5 tablets (0.0625 mg total) by mouth daily.  30 tablet  11  . feeding supplement (ENSURE COMPLETE) LIQD Take 237 mLs by mouth daily.      . furosemide (LASIX) 40 MG tablet Take 1 tablet (40 mg total) by mouth 2 (two) times daily.  180 tablet  1  . ibandronate (BONIVA) 150 MG tablet TAKE AS INSTRUCTED BY YOUR PRESCRIBER  3 tablet  0  . levothyroxine (SYNTHROID, LEVOTHROID) 75 MCG tablet Take 75 mcg by mouth daily before breakfast.      . metoprolol (LOPRESSOR) 25 MG tablet Take 0.5 tablets (12.5 mg total) by mouth 2 (two) times daily.  45 tablet  11  . potassium chloride SA (KLOR-CON M20) 20 MEQ tablet 1 tablet in the AM and 1/2 Tablet in the  PM  180 tablet  3  . Probiotic Product (PRO-FLORA CONCENTRATE) CAPS Take 1 capsule by mouth daily.        . simvastatin (ZOCOR) 40 MG tablet Take 1 tablet (40 mg total) by mouth at bedtime.  90 tablet  3  . vitamin E 400 UNIT capsule Take 400 Units by mouth daily.        Marland Kitchen. warfarin (COUMADIN) 2.5 MG tablet TAKE 1 TABLET AS DIRECTED  90 tablet  3   No current facility-administered medications for this visit.    Allergies  Codeine  Electrocardiogram:  afib rate 75 nonspecific ST/T wave changes Dig effect   Assessment and Plan

## 2013-10-19 NOTE — Assessment & Plan Note (Signed)
Good rate control and anticoagulation INR today in coumadin clinic No bleeding issues

## 2013-10-19 NOTE — Assessment & Plan Note (Signed)
Well controlled.  Continue current medications and low sodium Dash type diet.    

## 2013-10-19 NOTE — Assessment & Plan Note (Signed)
Surprisingly asymptomatic  Continue current dose lasix.  Euvolemic  Per [facility] no surgery or clip procedure

## 2013-11-16 ENCOUNTER — Ambulatory Visit (INDEPENDENT_AMBULATORY_CARE_PROVIDER_SITE_OTHER): Payer: Medicare Other | Admitting: Pharmacist

## 2013-11-16 DIAGNOSIS — Z5181 Encounter for therapeutic drug level monitoring: Secondary | ICD-10-CM

## 2013-11-16 DIAGNOSIS — Z7901 Long term (current) use of anticoagulants: Secondary | ICD-10-CM

## 2013-11-16 DIAGNOSIS — I4891 Unspecified atrial fibrillation: Secondary | ICD-10-CM

## 2013-11-16 LAB — POCT INR: INR: 1.9

## 2013-11-17 ENCOUNTER — Encounter (HOSPITAL_COMMUNITY): Payer: Self-pay | Admitting: Emergency Medicine

## 2013-11-17 ENCOUNTER — Emergency Department (HOSPITAL_COMMUNITY): Payer: Medicare Other

## 2013-11-17 ENCOUNTER — Inpatient Hospital Stay (HOSPITAL_COMMUNITY)
Admission: EM | Admit: 2013-11-17 | Discharge: 2013-11-27 | DRG: 291 | Disposition: A | Discharge status: Skilled Nursing Facility | Payer: Medicare Other | Attending: Cardiology | Admitting: Cardiology

## 2013-11-17 DIAGNOSIS — D72829 Elevated white blood cell count, unspecified: Secondary | ICD-10-CM | POA: Diagnosis present

## 2013-11-17 DIAGNOSIS — R64 Cachexia: Secondary | ICD-10-CM | POA: Diagnosis present

## 2013-11-17 DIAGNOSIS — E876 Hypokalemia: Secondary | ICD-10-CM | POA: Diagnosis not present

## 2013-11-17 DIAGNOSIS — J9620 Acute and chronic respiratory failure, unspecified whether with hypoxia or hypercapnia: Secondary | ICD-10-CM | POA: Diagnosis present

## 2013-11-17 DIAGNOSIS — Z79899 Other long term (current) drug therapy: Secondary | ICD-10-CM

## 2013-11-17 DIAGNOSIS — I272 Pulmonary hypertension, unspecified: Secondary | ICD-10-CM | POA: Diagnosis present

## 2013-11-17 DIAGNOSIS — D649 Anemia, unspecified: Secondary | ICD-10-CM | POA: Diagnosis present

## 2013-11-17 DIAGNOSIS — G9349 Other encephalopathy: Secondary | ICD-10-CM

## 2013-11-17 DIAGNOSIS — I340 Nonrheumatic mitral (valve) insufficiency: Secondary | ICD-10-CM | POA: Diagnosis present

## 2013-11-17 DIAGNOSIS — I5033 Acute on chronic diastolic (congestive) heart failure: Principal | ICD-10-CM | POA: Diagnosis present

## 2013-11-17 DIAGNOSIS — H9190 Unspecified hearing loss, unspecified ear: Secondary | ICD-10-CM | POA: Diagnosis present

## 2013-11-17 DIAGNOSIS — R0602 Shortness of breath: Secondary | ICD-10-CM

## 2013-11-17 DIAGNOSIS — E785 Hyperlipidemia, unspecified: Secondary | ICD-10-CM | POA: Diagnosis present

## 2013-11-17 DIAGNOSIS — Z885 Allergy status to narcotic agent status: Secondary | ICD-10-CM

## 2013-11-17 DIAGNOSIS — E875 Hyperkalemia: Secondary | ICD-10-CM | POA: Diagnosis present

## 2013-11-17 DIAGNOSIS — I2789 Other specified pulmonary heart diseases: Secondary | ICD-10-CM | POA: Diagnosis present

## 2013-11-17 DIAGNOSIS — Z87891 Personal history of nicotine dependence: Secondary | ICD-10-CM

## 2013-11-17 DIAGNOSIS — Z823 Family history of stroke: Secondary | ICD-10-CM

## 2013-11-17 DIAGNOSIS — Z7901 Long term (current) use of anticoagulants: Secondary | ICD-10-CM

## 2013-11-17 DIAGNOSIS — I10 Essential (primary) hypertension: Secondary | ICD-10-CM | POA: Diagnosis present

## 2013-11-17 DIAGNOSIS — M545 Low back pain, unspecified: Secondary | ICD-10-CM | POA: Diagnosis present

## 2013-11-17 DIAGNOSIS — G9340 Encephalopathy, unspecified: Secondary | ICD-10-CM | POA: Diagnosis present

## 2013-11-17 DIAGNOSIS — Z681 Body mass index (BMI) 19 or less, adult: Secondary | ICD-10-CM

## 2013-11-17 DIAGNOSIS — G8929 Other chronic pain: Secondary | ICD-10-CM | POA: Diagnosis present

## 2013-11-17 DIAGNOSIS — R778 Other specified abnormalities of plasma proteins: Secondary | ICD-10-CM | POA: Diagnosis present

## 2013-11-17 DIAGNOSIS — Z7982 Long term (current) use of aspirin: Secondary | ICD-10-CM

## 2013-11-17 DIAGNOSIS — R0682 Tachypnea, not elsewhere classified: Secondary | ICD-10-CM

## 2013-11-17 DIAGNOSIS — E7800 Pure hypercholesterolemia, unspecified: Secondary | ICD-10-CM | POA: Diagnosis present

## 2013-11-17 DIAGNOSIS — I509 Heart failure, unspecified: Secondary | ICD-10-CM | POA: Diagnosis present

## 2013-11-17 DIAGNOSIS — Z8249 Family history of ischemic heart disease and other diseases of the circulatory system: Secondary | ICD-10-CM

## 2013-11-17 DIAGNOSIS — E039 Hypothyroidism, unspecified: Secondary | ICD-10-CM | POA: Diagnosis present

## 2013-11-17 DIAGNOSIS — D509 Iron deficiency anemia, unspecified: Secondary | ICD-10-CM | POA: Diagnosis present

## 2013-11-17 DIAGNOSIS — I4891 Unspecified atrial fibrillation: Secondary | ICD-10-CM | POA: Diagnosis present

## 2013-11-17 DIAGNOSIS — I079 Rheumatic tricuspid valve disease, unspecified: Secondary | ICD-10-CM | POA: Diagnosis present

## 2013-11-17 DIAGNOSIS — I071 Rheumatic tricuspid insufficiency: Secondary | ICD-10-CM | POA: Diagnosis present

## 2013-11-17 DIAGNOSIS — R7989 Other specified abnormal findings of blood chemistry: Secondary | ICD-10-CM

## 2013-11-17 DIAGNOSIS — I059 Rheumatic mitral valve disease, unspecified: Secondary | ICD-10-CM | POA: Diagnosis present

## 2013-11-17 LAB — CBC
HCT: 32.5 % — ABNORMAL LOW (ref 36.0–46.0)
HEMOGLOBIN: 9.6 g/dL — AB (ref 12.0–15.0)
MCH: 23 pg — ABNORMAL LOW (ref 26.0–34.0)
MCHC: 29.5 g/dL — ABNORMAL LOW (ref 30.0–36.0)
MCV: 77.8 fL — AB (ref 78.0–100.0)
Platelets: 293 10*3/uL (ref 150–400)
RBC: 4.18 MIL/uL (ref 3.87–5.11)
RDW: 19 % — AB (ref 11.5–15.5)
WBC: 9.6 10*3/uL (ref 4.0–10.5)

## 2013-11-17 LAB — COMPREHENSIVE METABOLIC PANEL
ALBUMIN: 4.1 g/dL (ref 3.5–5.2)
ALK PHOS: 101 U/L (ref 39–117)
ALT: 12 U/L (ref 0–35)
AST: 30 U/L (ref 0–37)
BILIRUBIN TOTAL: 0.7 mg/dL (ref 0.3–1.2)
BUN: 17 mg/dL (ref 6–23)
CHLORIDE: 93 meq/L — AB (ref 96–112)
CO2: 22 meq/L (ref 19–32)
CREATININE: 1.04 mg/dL (ref 0.50–1.10)
Calcium: 10 mg/dL (ref 8.4–10.5)
GFR calc Af Amer: 53 mL/min — ABNORMAL LOW (ref >90)
GFR, EST NON AFRICAN AMERICAN: 46 mL/min — AB (ref >90)
GLUCOSE: 185 mg/dL — AB (ref 70–99)
Potassium: 4.1 mEq/L (ref 3.7–5.3)
Sodium: 132 mEq/L — ABNORMAL LOW (ref 137–147)
Total Protein: 7.9 g/dL (ref 6.0–8.3)

## 2013-11-17 LAB — I-STAT TROPONIN, ED: Troponin i, poc: 0.06 ng/mL (ref 0.00–0.08)

## 2013-11-17 LAB — I-STAT ARTERIAL BLOOD GAS, ED
ACID-BASE DEFICIT: 1 mmol/L (ref 0.0–2.0)
Bicarbonate: 25.8 mEq/L — ABNORMAL HIGH (ref 20.0–24.0)
O2 Saturation: 35 %
TCO2: 27 mmol/L (ref 0–100)
pCO2 arterial: 53.4 mmHg — ABNORMAL HIGH (ref 35.0–45.0)
pH, Arterial: 7.292 — ABNORMAL LOW (ref 7.350–7.450)
pO2, Arterial: 24 mmHg — CL (ref 80.0–100.0)

## 2013-11-17 LAB — PRO B NATRIURETIC PEPTIDE: PRO B NATRI PEPTIDE: 3039 pg/mL — AB (ref 0–450)

## 2013-11-17 MED ORDER — FUROSEMIDE 10 MG/ML IJ SOLN
20.0000 mg | Freq: Once | INTRAMUSCULAR | Status: AC
Start: 2013-11-17 — End: 2013-11-17
  Administered 2013-11-17: 20 mg via INTRAVENOUS
  Filled 2013-11-17: qty 2

## 2013-11-17 NOTE — ED Notes (Signed)
EMS states pt was administered Nitro SL x 1.

## 2013-11-17 NOTE — ED Notes (Signed)
PBT at bedside

## 2013-11-17 NOTE — H&P (Signed)
CARDIOLOGY ADMISSION NOTE  Patient ID: Erin MoundMoira G Justman MRN: [id number] DOB/AGE: 78-Sep-1922 78 y.o.  Admit date: 11/17/2013 Primary Physician   Michele McalpineNADEL,SCOTT M, MD Primary Cardiologist   Dr. Eden EmmsNishan Chief Complaint    Dyspnea  HPI:  The patient has a history of atrial fibrillation, acute on chronic diastolic heart failure with severe MR with bileaflet prolapse.  [facility] is is thought not to be a surgical or clip candidate.  Her last echocardiogram was in June of last year.  The EF was 55%.  She also has moderate TR.   She was in her usual state of health. She said she had a busy day yesterday going to get her INR checked and shopping. She however felt relatively well. She wasn't having any excessive shortness of breath. She hasn't had any cough fevers or chills. She hasn't had any weight gain and she does weigh herself daily. She wasn't having any swelling. She watches her salt but hasn't had any excessive fluid. She has been craving ice. She's not been having any chest pressure, neck or arm discomfort. She hasn't noticed any new palpitations, presyncope or syncope. However, after eating dinner around 6:30 tonight she became acutely short of breath. She had some nausea. She somewhat diaphoretic.  EMS was notified. She was on BiPAP on arrival. However, she was soon oxygenating well. She's now on nasal cannula. She was treated with IV Lasix.  Past Medical History  Diagnosis Date  . MVP (mitral valve prolapse)   . Hyperlipidemia   . Hypertension   . Osteoporosis   . Anemia   . Hypothyroid   . Atrial fibrillation     permanent; coumadin Rx  . Mitral regurgitation     a. not a surgical candidate;  b. 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 Hypercholesteremia   . Pneumonia   . Hearing loss   .  Allergic rhinitis   . Chronic low back pain   . Shingles   . Chronic diastolic heart failure     Past Surgical History  Procedure Laterality Date  . Myringotomies      Allergies  Allergen Reactions  . Codeine Nausea Only    Also dizziness   No current facility-administered medications on file prior to encounter.   Current Outpatient Prescriptions on File Prior to Encounter  Medication Sig Dispense Refill  . Ascorbic Acid (VITAMIN C) 500 MG tablet Take 500 mg by mouth daily.        Marland Kitchen. aspirin 81 MG tablet Take 81 mg by mouth daily.        . Calcium Carbonate-Vitamin D (CALTRATE 600+D) 600-400 MG-UNIT per tablet Take 1 tablet by mouth daily.        . Cholecalciferol (VITAMIN D) 1000 UNITS capsule Take 1,000 Units by mouth daily.        . digoxin (LANOXIN) 0.125 MG tablet Take 0.5 tablets (0.0625 mg total) by mouth daily.  30 tablet  11  . feeding supplement (ENSURE COMPLETE) LIQD Take 237 mLs by mouth daily.      . furosemide (LASIX) 40 MG tablet Take 1 tablet (40 mg total) by mouth 2 (two) times daily.  180 tablet  1  . levothyroxine (SYNTHROID, LEVOTHROID) 75 MCG tablet Take 75 mcg by mouth daily before breakfast.      .  metoprolol (LOPRESSOR) 25 MG tablet Take 0.5 tablets (12.5 mg total) by mouth 2 (two) times daily.  45 tablet  11  . potassium chloride SA (KLOR-CON M20) 20 MEQ tablet 1 tablet in the AM and 1/2 Tablet in the PM  180 tablet  3  . Probiotic Product (PRO-FLORA CONCENTRATE) CAPS Take 1 capsule by mouth daily.        . simvastatin (ZOCOR) 40 MG tablet Take 1 tablet (40 mg total) by mouth at bedtime.  90 tablet  3  . vitamin E 400 UNIT capsule Take 400 Units by mouth daily.         History   Social History  . Marital Status: Married    Spouse Name: N/A    Number of Children: 2  . Years of Education: N/A   Occupational History  . Not on file.   Social History Main Topics  . Smoking status: Former Smoker -- 10 years    Types: Cigarettes    Quit date: 09/13/1968    . Smokeless tobacco: Never Used     Comment: only smoked 1 cig per day  . Alcohol Use: Yes     Comment: 1 glass of wine with dinner  . Drug Use: Not on file  . Sexual Activity: Not on file   Other Topics Concern  . Not on file   Social History Narrative  . No narrative on file    Family History  Problem Relation Age of Onset  . Stroke Mother   . Cancer Father   . Heart disease Brother     s/p MI     ROS:  As stated in the HPI and negative for all other systems.  Physical Exam: Blood pressure 126/75, pulse 94, resp. rate 24, SpO2 97.00%.  GENERAL:  No distress but frail. HEENT:  Pupils equal round and reactive, fundi not visualized, oral mucosa unremarkable NECK:  JVD to jaw at 45 degrees, carotid upstroke brisk and symmetric, no bruits, no thyromegaly LYMPHATICS:  No cervical, inguinal adenopathy LUNGS:  Decreased breath sounds with basilar crackles. BACK:  No CVA tenderness CHEST:  Unremarkable HEART:  PMI not displaced or sustained,S1 and S2 within normal limits, no S3, no clicks, no rubs, 3/6 systolic murmur at the apex and axilla, no diastolic murmurs ABD:  Flat, positive bowel sounds normal in frequency in pitch, no bruits, no rebound, no guarding, no midline pulsatile mass, no hepatomegaly, no splenomegaly EXT:  2 plus pulses throughout, no edema, no cyanosis no clubbing SKIN:  No rashes no nodules NEURO:  Cranial nerves II through XII grossly intact, motor grossly intact throughout PSYCH:  Cognitively intact, oriented to person place and time  Labs: Lab Results  Component Value Date   BUN 17 11/17/2013   Lab Results  Component Value Date   CREATININE 1.04 11/17/2013   Lab Results  Component Value Date   NA 132* 11/17/2013   K 4.1 11/17/2013   CL 93* 11/17/2013   CO2 22 11/17/2013    Lab Results  Component Value Date   WBC 9.6 11/17/2013   HGB 9.6* 11/17/2013   HCT 32.5* 11/17/2013   MCV 77.8* 11/17/2013   PLT 293 11/17/2013    Lab Results  Component Value Date    ALT 12 11/17/2013   AST 30 11/17/2013   ALKPHOS 101 11/17/2013   BILITOT 0.7 11/17/2013      Radiology:  CXR:  Cardiomegaly. Interstitial and alveolar prominence consistent with  CHF. Calcified tortuous aorta. No  effusion or pneumothorax. Osseous  demineralization. Worsening aeration from priors.   EKG:  Atrial fib, rate 111, poor anterior R wave progression.  Lateral T wave inversion and ST depression not significantly different than previous EKG.  11/18/2013  ASSESSMENT AND PLAN:    ACUTE ON CHRONIC DIASTOLIC HF:  She is acute heart failure presumably with still preserved ejection fraction. We'll start her on IV Lasix. I will order another echocardiogram.  MR:  She has been deemed not to be a surgical candidate.  However, I don't know that she was turned down  definitely for any percutaneous treatment.    ATRIAL FIBRILLATION:  Her rate is slightly elevated. She will continue on her anticoagulation and for now by mouth meds. However, she may need further med adjustment.  ANEMIA:  She has had no signs or symptoms of active bleeding. However, her hemoglobin is slightly lower than her baseline. I will check stool guaiacs.  SignedRollene Rotunda 11/18/2013, 12:14 AM

## 2013-11-17 NOTE — ED Notes (Signed)
Arrieta MD at bedside. 

## 2013-11-17 NOTE — ED Provider Notes (Signed)
I saw and evaluated the patient, reviewed the resident's note and I agree with the findings and plan.   EKG Interpretation   Date/Time:  Saturday November 17 2013 21:45:47 EST Ventricular Rate:  111 PR Interval:    QRS Duration: 92 QT Interval:  356 QTC Calculation: 484 R Axis:   76 Text Interpretation:  Atrial fibrillation Repol abnrm suggests ischemia,  diffuse leads Minimal ST elevation, lateral leads No significant change  was found Confirmed by [facility]  MD, TREY ([id number]) on 11/17/2013 11:33:57 PM      CRITICAL CARE Performed by: Blake DivineWOFFORD, Caliope Ruppert DAVID III   Total critical care time: 35  Critical care time was exclusive of separately billable procedures and treating other patients.  Critical care was necessary to treat or prevent imminent or life-threatening deterioration.  Critical care was time spent personally by me on the following activities: development of treatment plan with patient and/or surrogate as well as nursing, discussions with consultants, evaluation of patient's response to treatment, examination of patient, obtaining history from patient or surrogate, ordering and performing treatments and interventions, ordering and review of laboratory studies, ordering and review of radiographic studies, pulse oximetry and re-evaluation of patient's condition.  80 year old female presenting in respiratory distress. Per EMS, and O2 sats were in the mid 80s upon arrival. They are upper 90s on BiPAP, initiated by EMS, and continued here. On exam, alert, elderly, mild respiratory distress, on BiPAP, lungs with diffuse rales, no wheezing, S1, S2, with loud systolic murmur, abdomen soft and nontender. Workup was consistent with CHF exacerbation. Plan admission to step down unit.  Clinical Impression: 1. Acute on chronic diastolic HF (heart failure)   2. Acute CHF   3. SOB (shortness of breath)   4. Tachypnea   5. Atrial fibrillation       Candyce ChurnJohn David Filmore Molyneux III, MD 11/18/13 [phone]

## 2013-11-17 NOTE — Progress Notes (Signed)
Patient taken off Bipap and placed on 4L nasal cannula at this time. Tolerating well, VSS, and patient breathing comfortably. RT will continue to monitor.

## 2013-11-17 NOTE — ED Notes (Signed)
Pt arrived from [facility], c/o SOB, diaphoretic, accessory muscle use, rales. Pt placed on CPAP by EMS and administered 5 mg albuterol, and 125 mg solumedrol. EMS VS 100% on CPAP. BP 160/90. Afib with RVR on EMS 12 lead.

## 2013-11-17 NOTE — ED Provider Notes (Signed)
CSN: [id number]     Arrival date & time 11/17/13  2136 History   First MD Initiated Contact with Patient 11/17/13 2137     Chief Complaint  Patient presents with  . 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 Presents with sudden onset of sob and blt rales on exam. Patient denies any CP. She states she felt fine earlier today when she suddenly started to feel dyspneic. She is on coumadin last INR was 1.9 on Friday. She reports a similar episode almost a year ago when she was admitted for CHF exacerbation. On arrival she is not hypertensive. EMS started her on BIPAP. No noted alleviating or aggravating factors.   Echo: 03/07/2013  Study Conclusions - Left ventricle: The cavity size was normal. Wall thickness was normal. The estimated ejection fraction was 55%. - Aortic valve: Mild regurgitation. 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 - Left atrium: The atrium was severely dilated. - Right atrium: The atrium was moderately dilated. - Atrial septum: No defect or patent foramen ovale was identified. - Tricuspid valve: Moderate regurgitation. - Pulmonary arteries: PA peak pressure: 66mm Hg (S).   Past Medical History  Diagnosis Date  . MVP (mitral valve prolapse)   . Hyperlipidemia   . Hypertension   . Osteoporosis   . Anemia   . Hypothyroid   . Atrial fibrillation     permanent; coumadin Rx  . Mitral regurgitation     a. not a surgical candidate;  b. 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 Hypercholesteremia   . Pneumonia   . Hearing loss   . Allergic rhinitis   . Chronic low back pain   . Shingles   . Chronic diastolic heart failure    Past  Surgical History  Procedure Laterality Date  . Myringotomies     Family History  Problem Relation Age of Onset  . Stroke Mother   . Cancer Father   . Heart disease Brother     s/p MI   History  Substance Use Topics  . Smoking status: Former Smoker -- 10 years    Types: Cigarettes    Quit date: 09/13/1968  . Smokeless tobacco: Never Used     Comment: only smoked 1 cig per day  . Alcohol Use: Yes     Comment: 1 glass of wine with dinner   OB History   Grav Para Term Preterm Abortions TAB SAB Ect Mult Living                 Review of Systems  Constitutional: Negative for fever, chills, activity change and appetite change.  HENT: Negative for congestion, ear pain and rhinorrhea.   Eyes: Negative for pain.  Respiratory: Positive for cough and shortness of breath.   Cardiovascular: Negative for chest pain and palpitations.  Gastrointestinal: Negative for nausea, vomiting and abdominal pain.  Genitourinary: Negative for dysuria, difficulty urinating and pelvic pain.  Musculoskeletal: Negative for back pain and neck pain.  Skin: Negative for rash and wound.  Neurological: Negative for weakness and headaches.  Psychiatric/Behavioral: Negative for behavioral problems, confusion and agitation.      Allergies  Codeine  Home Medications  Current Outpatient Rx  Name  Route  Sig  Dispense  Refill  . Ascorbic Acid (VITAMIN C) 500 MG tablet   Oral   Take 500 mg by mouth daily.           Marland Kitchen. aspirin 81 MG tablet   Oral   Take 81 mg by mouth daily.           . Calcium Carbonate-Vitamin D (CALTRATE 600+D) 600-400 MG-UNIT per tablet   Oral   Take 1 tablet by mouth daily.           . Cholecalciferol (VITAMIN D) 1000 UNITS capsule   Oral   Take 1,000 Units by mouth daily.           . digoxin (LANOXIN) 0.125 MG tablet   Oral   Take 0.5 tablets (0.0625 mg total) by mouth daily.   30 tablet   11   . feeding supplement (ENSURE COMPLETE) LIQD   Oral   Take 237 mLs  by mouth daily.         . furosemide (LASIX) 40 MG tablet   Oral   Take 1 tablet (40 mg total) by mouth 2 (two) times daily.   180 tablet   1   . ibandronate (BONIVA) 150 MG tablet   Oral   Take 150 mg by mouth every 30 (thirty) days. Take in the morning with a full glass of water, on an empty stomach, and do not take anything else by mouth or lie down for the next 30 min.         Marland Kitchen. levothyroxine (SYNTHROID, LEVOTHROID) 75 MCG tablet   Oral   Take 75 mcg by mouth daily before breakfast.         . metoprolol (LOPRESSOR) 25 MG tablet   Oral   Take 0.5 tablets (12.5 mg total) by mouth 2 (two) times daily.   45 tablet   11   . potassium chloride SA (KLOR-CON M20) 20 MEQ tablet      1 tablet in the AM and 1/2 Tablet in the PM   180 tablet   3   . Probiotic Product (PRO-FLORA CONCENTRATE) CAPS   Oral   Take 1 capsule by mouth daily.           . simvastatin (ZOCOR) 40 MG tablet   Oral   Take 1 tablet (40 mg total) by mouth at bedtime.   90 tablet   3   . vitamin E 400 UNIT capsule   Oral   Take 400 Units by mouth daily.           Marland Kitchen. warfarin (COUMADIN) 2.5 MG tablet   Oral   Take 1.25-2.5 mg by mouth See admin instructions. Takes 1.25mg  on Mon, Wed, Fri and Sat Takes 2.5mg  all other days          BP 116/59  Pulse 106  Resp 26  SpO2 97% Physical Exam  Constitutional: She is oriented to person, place, and time. She appears well-developed and well-nourished. She appears distressed.  HENT:  Head: Normocephalic and atraumatic.  Nose: Nose normal.  Mouth/Throat: Oropharynx is clear and moist.  Eyes: EOM are normal. Pupils are equal, round, and reactive to light.  Neck: Normal range of motion. Neck supple. No tracheal deviation present.  Cardiovascular: Intact distal pulses.   Murmur heard. Tachycardia   Pulmonary/Chest: She is in respiratory distress. She has no wheezes. She has rales ( bilateral ).  Abdominal: Soft. Bowel sounds are  normal. She exhibits  no distension. There is no tenderness. There is no rebound and no guarding.  Musculoskeletal: Normal range of motion. She exhibits no tenderness.  Neurological: She is alert and oriented to person, place, and time.  Skin: Skin is warm and dry. No rash noted.  Psychiatric: She has a normal mood and affect. Her behavior is normal.    ED Course  Procedures (including critical care time) Labs Review Labs Reviewed  CBC - Abnormal; Notable for the following:    Hemoglobin 9.6 (*)    HCT 32.5 (*)    MCV 77.8 (*)    MCH 23.0 (*)    MCHC 29.5 (*)    RDW 19.0 (*)    All other components within normal limits  COMPREHENSIVE METABOLIC PANEL - Abnormal; Notable for the following:    Sodium 132 (*)    Chloride 93 (*)    Glucose, Bld 185 (*)    GFR calc non Af Amer 46 (*)    GFR calc Af Amer 53 (*)    All other components within normal limits  PRO B NATRIURETIC PEPTIDE - Abnormal; Notable for the following:    Pro B Natriuretic peptide (BNP) 3039.0 (*)    All other components within normal limits  I-STAT ARTERIAL BLOOD GAS, ED - Abnormal; Notable for the following:    pH, Arterial 7.292 (*)    pCO2 arterial 53.4 (*)    pO2, Arterial 24.0 (*)    Bicarbonate 25.8 (*)    All other components within normal limits  PROTIME-INR  I-STAT TROPOININ, ED   Imaging Review Dg Chest Portable 1 View  11/17/2013   CLINICAL DATA:  Respiratory distress without chest pain.  EXAM: PORTABLE CHEST - 1 VIEW  COMPARISON:  03/21/2013.  FINDINGS: Cardiomegaly. Interstitial and alveolar prominence consistent with CHF. Calcified tortuous aorta. No effusion or pneumothorax. Osseous demineralization. Worsening aeration from priors.  IMPRESSION: Cardiomegaly.  CHF.   Electronically Signed   By: Davonna Belling M.D.   On: 11/17/2013 22:58     EKG Interpretation   Date/Time:  Saturday November 17 2013 21:45:47 EST Ventricular Rate:  111 PR Interval:    QRS Duration: 92 QT Interval:  356 QTC Calculation: 484 R Axis:    76 Text Interpretation:  Atrial fibrillation Repol abnrm suggests ischemia,  diffuse leads Minimal ST elevation, lateral leads No significant change  was found Confirmed by [facility]  MD, TREY ([id number]) on 11/17/2013 11:33:57 PM      MDM   Final diagnoses:  Acute CHF  SOB (shortness of breath)  Tachypnea    80 yo F with acute CHF exacerbation possibly related to her severe mitral valve. Patient on BIPAP on arrival . She received dose of 20 mg lasix IV. Foley placed. EKG show AFIB. CXR with pulm edema and elevated BNP. Patients oxygenation and respiratory status improved rapidly and was taken of the BIPAP. Case co managed with my attending Dr. Loretha Stapler.   12:38 AM Patient tachypneic and asking to be placed back on bipap. BIPAP restarted. Patient has been evaluated by Dr. Antoine Poche. Plan to admit for further care.    Nadara Mustard, MD 11/18/13 [phone]

## 2013-11-18 ENCOUNTER — Encounter (HOSPITAL_COMMUNITY): Payer: Self-pay | Admitting: *Deleted

## 2013-11-18 DIAGNOSIS — I509 Heart failure, unspecified: Secondary | ICD-10-CM

## 2013-11-18 DIAGNOSIS — I059 Rheumatic mitral valve disease, unspecified: Secondary | ICD-10-CM

## 2013-11-18 DIAGNOSIS — I5033 Acute on chronic diastolic (congestive) heart failure: Secondary | ICD-10-CM | POA: Diagnosis present

## 2013-11-18 LAB — URINALYSIS, ROUTINE W REFLEX MICROSCOPIC
Bilirubin Urine: NEGATIVE
GLUCOSE, UA: NEGATIVE mg/dL
Hgb urine dipstick: NEGATIVE
Ketones, ur: NEGATIVE mg/dL
LEUKOCYTES UA: NEGATIVE
Nitrite: NEGATIVE
PH: 5 (ref 5.0–8.0)
Protein, ur: NEGATIVE mg/dL
Specific Gravity, Urine: 1.017 (ref 1.005–1.030)
Urobilinogen, UA: 0.2 mg/dL (ref 0.0–1.0)

## 2013-11-18 LAB — TROPONIN I
TROPONIN I: 0.4 ng/mL — AB (ref <0.30)
TROPONIN I: 0.51 ng/mL — AB (ref <0.30)
Troponin I: 0.49 ng/mL (ref <0.30)

## 2013-11-18 LAB — MRSA PCR SCREENING: MRSA BY PCR: NEGATIVE

## 2013-11-18 LAB — PROTIME-INR
INR: 2.28 — ABNORMAL HIGH (ref 0.00–1.49)
Prothrombin Time: 24.4 seconds — ABNORMAL HIGH (ref 11.6–15.2)

## 2013-11-18 LAB — TSH: TSH: 1.762 u[IU]/mL (ref 0.350–4.500)

## 2013-11-18 LAB — DIGOXIN LEVEL: DIGOXIN LVL: 0.4 ng/mL — AB (ref 0.8–2.0)

## 2013-11-18 MED ORDER — METOPROLOL TARTRATE 12.5 MG HALF TABLET
12.5000 mg | ORAL_TABLET | Freq: Two times a day (BID) | ORAL | Status: DC
Start: 2013-11-18 — End: 2013-11-20
  Administered 2013-11-18 – 2013-11-19 (×5): 12.5 mg via ORAL
  Filled 2013-11-18 (×7): qty 1

## 2013-11-18 MED ORDER — POTASSIUM CHLORIDE CRYS ER 20 MEQ PO TBCR
20.0000 meq | EXTENDED_RELEASE_TABLET | Freq: Every day | ORAL | Status: DC
Start: 2013-11-18 — End: 2013-11-19
  Administered 2013-11-18: 20 meq via ORAL
  Filled 2013-11-18 (×2): qty 1

## 2013-11-18 MED ORDER — WHITE PETROLATUM GEL
Status: AC
  Administered 2013-11-18: 0.2
  Filled 2013-11-18: qty 5

## 2013-11-18 MED ORDER — FUROSEMIDE 10 MG/ML IJ SOLN
40.0000 mg | Freq: Two times a day (BID) | INTRAMUSCULAR | Status: DC
  Administered 2013-11-18 – 2013-11-23 (×11): 40 mg via INTRAVENOUS
  Filled 2013-11-18 (×14): qty 4

## 2013-11-18 MED ORDER — SODIUM CHLORIDE 0.9 % IJ SOLN: 3.0000 mL | INTRAMUSCULAR | Status: DC | PRN

## 2013-11-18 MED ORDER — ASPIRIN EC 81 MG PO TBEC
81.0000 mg | DELAYED_RELEASE_TABLET | Freq: Every day | ORAL | Status: DC
  Administered 2013-11-18 – 2013-11-27 (×10): 81 mg via ORAL
  Filled 2013-11-18 (×10): qty 1

## 2013-11-18 MED ORDER — LEVOTHYROXINE SODIUM 75 MCG PO TABS
75.0000 ug | ORAL_TABLET | Freq: Every day | ORAL | Status: DC
  Administered 2013-11-18 – 2013-11-27 (×10): 75 ug via ORAL
  Filled 2013-11-18 (×11): qty 1

## 2013-11-18 MED ORDER — WARFARIN SODIUM 2.5 MG PO TABS
2.5000 mg | ORAL_TABLET | ORAL | Status: DC
  Administered 2013-11-18: 2.5 mg via ORAL
  Filled 2013-11-18: qty 1

## 2013-11-18 MED ORDER — SODIUM CHLORIDE 0.9 % IJ SOLN
3.0000 mL | Freq: Two times a day (BID) | INTRAMUSCULAR | Status: DC
  Administered 2013-11-18 – 2013-11-27 (×19): 3 mL via INTRAVENOUS

## 2013-11-18 MED ORDER — WARFARIN 1.25 MG HALF TABLET
1.2500 mg | ORAL_TABLET | ORAL | Status: DC
  Filled 2013-11-18: qty 1

## 2013-11-18 MED ORDER — SIMVASTATIN 40 MG PO TABS
40.0000 mg | ORAL_TABLET | Freq: Every day | ORAL | Status: DC
  Administered 2013-11-18 – 2013-11-26 (×9): 40 mg via ORAL
  Filled 2013-11-18 (×10): qty 1

## 2013-11-18 MED ORDER — ENSURE COMPLETE PO LIQD
237.0000 mL | ORAL | Status: DC
Start: 2013-11-18 — End: 2013-11-27
  Administered 2013-11-18 – 2013-11-26 (×9): 237 mL via ORAL

## 2013-11-18 MED ORDER — SODIUM CHLORIDE 0.9 % IV SOLN: 250.0000 mL | INTRAVENOUS | Status: DC | PRN

## 2013-11-18 MED ORDER — LORAZEPAM 2 MG/ML IJ SOLN
1.0000 mg | INTRAMUSCULAR | Status: DC | PRN
  Administered 2013-11-18 – 2013-11-19 (×3): 1 mg via INTRAVENOUS
  Filled 2013-11-18 (×3): qty 1

## 2013-11-18 MED ORDER — DIGOXIN 0.0625 MG HALF TABLET
0.0625 mg | ORAL_TABLET | Freq: Every day | ORAL | Status: DC
  Administered 2013-11-18 – 2013-11-27 (×10): 0.0625 mg via ORAL
  Filled 2013-11-18 (×10): qty 1

## 2013-11-18 MED ORDER — WARFARIN - PHARMACIST DOSING INPATIENT
Freq: Every day | Status: DC
  Administered 2013-11-18: 17:00:00
  Administered 2013-11-21 – 2013-11-22 (×2): 1
  Administered 2013-11-23 – 2013-11-26 (×2)

## 2013-11-18 NOTE — ED Notes (Signed)
MD at bedside. Cardiology 

## 2013-11-18 NOTE — Progress Notes (Signed)
  Echocardiogram 2D Echocardiogram has been performed.  Charmain Diosdado FRANCES 11/18/2013, 4:19 PM

## 2013-11-18 NOTE — Progress Notes (Signed)
ANTICOAGULATION CONSULT NOTE - Initial Consult  Pharmacy Consult for Coumadin Indication: atrial fibrillation  Allergies  Allergen Reactions  . Codeine Nausea Only    Also dizziness    Patient Measurements: Height: 5\' 2"  (157.5 cm) Weight: 101 lb 9.6 oz (46.085 kg) IBW/kg (Calculated) : 50.1  Vital Signs: Temp: 97.4 F (36.3 C) (03/08 0313) Temp src: Oral (03/08 0313) BP: 126/77 mmHg (03/08 0313) Pulse Rate: 107 (03/08 0313)  Labs:  Recent Labs  11/16/13 1129 11/17/13 2206 11/18/13 0040  HGB  --  9.6*  --   HCT  --  32.5*  --   PLT  --  293  --   LABPROT  --   --  24.4*  INR 1.9  --  2.28*  CREATININE  --  1.04  --     Estimated Creatinine Clearance: 26.2 ml/min (by C-G formula based on Cr of 1.04).   Medical History: Past Medical History  Diagnosis Date  . MVP (mitral valve prolapse)   . Hyperlipidemia   . Hypertension   . Osteoporosis   . Anemia   . Hypothyroid   . Atrial fibrillation     permanent; coumadin Rx  . Mitral regurgitation     a. not a surgical candidate;  b. 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 Hypercholesteremia   . Pneumonia   . Hearing loss   . Allergic rhinitis   . Chronic low back pain   . Shingles   . Chronic diastolic heart failure     Medications:  Prescriptions prior to admission  Medication Sig Dispense Refill  . Ascorbic Acid (VITAMIN C) 500 MG tablet Take 500 mg by mouth daily.        Marland Kitchen. aspirin 81 MG tablet Take 81 mg by mouth daily.        . Calcium Carbonate-Vitamin D (CALTRATE 600+D) 600-400 MG-UNIT per tablet Take 1 tablet by mouth daily.        . Cholecalciferol (VITAMIN D) 1000 UNITS capsule Take 1,000 Units by mouth daily.        . digoxin (LANOXIN) 0.125 MG tablet Take 0.5 tablets (0.0625 mg total) by mouth daily.  30 tablet  11  .  feeding supplement (ENSURE COMPLETE) LIQD Take 237 mLs by mouth daily.      . furosemide (LASIX) 40 MG tablet Take 1 tablet (40 mg total) by mouth 2 (two) times daily.  180 tablet  1  . ibandronate (BONIVA) 150 MG tablet Take 150 mg by mouth every 30 (thirty) days. Take in the morning with a full glass of water, on an empty stomach, and do not take anything else by mouth or lie down for the next 30 min.      Marland Kitchen. levothyroxine (SYNTHROID, LEVOTHROID) 75 MCG tablet Take 75 mcg by mouth daily before breakfast.      . metoprolol (LOPRESSOR) 25 MG tablet Take 0.5 tablets (12.5 mg total) by mouth 2 (two) times daily.  45 tablet  11  . potassium chloride SA (KLOR-CON M20) 20 MEQ tablet 1 tablet in the AM and 1/2 Tablet in the PM  180 tablet  3  . Probiotic Product (PRO-FLORA CONCENTRATE) CAPS Take 1 capsule by mouth daily.        . simvastatin (ZOCOR) 40 MG tablet Take 1 tablet (40 mg  total) by mouth at bedtime.  90 tablet  3  . vitamin E 400 UNIT capsule Take 400 Units by mouth daily.        Marland Kitchen warfarin (COUMADIN) 2.5 MG tablet Take 1.25-2.5 mg by mouth See admin instructions. Takes 1.25mg  on Mon, Wed, Fri and Sat Takes 2.5mg  all other days        Assessment: 80 yo female admitted with SOB/CHF, h/o Afib, to continue Coumadin  Goal of Therapy:  INR 2-3 Monitor platelets by anticoagulation protocol: Yes   Plan:  Continue home regimen Daily INR  Eddie Candle 11/18/2013,3:23 AM

## 2013-11-18 NOTE — Progress Notes (Signed)
Patient ID: Erin MoundMoira G Rivers, female   DOB: 04/05/1921, 80 y.o.   MRN: [id number]    Subjective:  Dyspnic on bipap    Objective:  Filed Vitals:   11/18/13 0748 11/18/13 0800 11/18/13 0916 11/18/13 0917  BP: 109/70 112/74  126/68  Pulse: 98 104 102   Temp: 97.6 F (36.4 C)     TempSrc: Axillary     Resp: 29 24    Height:      Weight:      SpO2: 100% 98%      Intake/Output from previous day:  Intake/Output Summary (Last 24 hours) at 11/18/13 1036 Last data filed at 11/18/13 0751  Gross per 24 hour  Intake      0 ml  Output    125 ml  Net   -125 ml    Physical Exam: Affect appropriate Frail pale scottish female  HEENT: normal Neck supple with no adenopathy JVP normal no bruits no thyromegaly Lungs rales mid and basilar bilaterally wheezing and good diaphragmatic motion Heart:  S1/S2 MR  murmur, no rub, gallop or click PMI normal Abdomen: benighn, BS positve, no tenderness, no AAA no bruit.  No HSM or HJR Distal pulses intact with no bruits No edema Neuro non-focal Skin warm and dry No muscular weakness   Lab Results: Basic Metabolic Panel:  Recent Labs  08/65/7802/03/27 2206  NA 132*  K 4.1  CL 93*  CO2 22  GLUCOSE 185*  BUN 17  CREATININE 1.04  CALCIUM 10.0   Liver Function Tests:  Recent Labs  11/17/13 2206  AST 30  ALT 12  ALKPHOS 101  BILITOT 0.7  PROT 7.9  ALBUMIN 4.1   CBC:  Recent Labs  11/17/13 2206  WBC 9.6  HGB 9.6*  HCT 32.5*  MCV 77.8*  PLT 293   Cardiac Enzymes:  Recent Labs  11/18/13 0420 11/18/13 0835  TROPONINI 0.40* 0.51*    Imaging: Dg Chest Portable 1 View  11/17/2013   CLINICAL DATA:  Respiratory distress without chest pain.  EXAM: PORTABLE CHEST - 1 VIEW  COMPARISON:  03/21/2013.  FINDINGS: Cardiomegaly. Interstitial and alveolar prominence consistent with CHF. Calcified tortuous aorta. No effusion or pneumothorax. Osseous demineralization. Worsening aeration from priors.  IMPRESSION: Cardiomegaly.  CHF.    Electronically Signed   By: Davonna BellingJohn  Curnes M.D.   On: 11/17/2013 22:58    Cardiac Studies:  ECG:  afib nonspecific ST  T wave changes    Telemetry:  afib rapid rate   Echo: Study Conclusions  - Left ventricle: The cavity size was normal. Wall thickness was normal. The estimated ejection fraction was 55%. - Aortic valve: Mild regurgitation. 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 - Left atrium: The atrium was severely dilated. - Right atrium: The atrium was moderately dilated. - Atrial septum: No defect or patent foramen ovale was identified. - Tricuspid valve: Moderate regurgitation. - Pulmonary arteries: PA peak pressure: 66mm Hg (S).    Medications:   . aspirin EC  81 mg Oral Daily  . digoxin  0.0625 mg Oral Daily  . feeding supplement (ENSURE COMPLETE)  237 mL Oral Q24H  . furosemide  40 mg Intravenous BID  . levothyroxine  75 mcg Oral QAC breakfast  . metoprolol  12.5 mg Oral BID  . potassium chloride SA  20 mEq Oral Daily  . simvastatin  40 mg Oral QHS  . sodium chloride  3 mL Intravenous Q12H  . [START ON 11/19/2013] warfarin  1.25 mg Oral Once per day on Mon Wed Fri Sat  . warfarin  2.5 mg Oral Once per day on Sun Tue Thu  . Warfarin - Pharmacist Dosing Inpatient   Does not apply q1800       Assessment/Plan:  Pulmonary edema.  She has been seen at [facility] by [facility], [place] and [place].  I am not clear that she was refused percutaneous procedure As opposed to "your doing ok with medicine for now"  However she has had 3 episodes of flash pulmonary edema  Would stabilize and get Off bibap.  Continue diuresis.  Re check echo  Ativan for anxiety  Transfer to [facility] ? Tuesday for further inpatient evaluation for mitral clip Or other percutaneous Rx for severe MR  Not an open operative candidate due to advanced age.  This and pulmonary HTN poor prognostic  Signs  Afib continue metoprolol  And coumadin  INR 2.28 yesterday   Charlton Haws 11/18/2013, 10:36 AM

## 2013-11-18 NOTE — Progress Notes (Signed)
Bipap is not needed at this time. Pt encouraged to call if having any difficulty breathing. Pt has diminished BBS and VSS at this time. RT will continue to monitor.

## 2013-11-18 NOTE — Progress Notes (Signed)
CRITICAL VALUE ALERT  Critical value received:  Troponin   Date of notification:  11/18/13    Time of notification:  0550  Critical value read back:YES   Nurse who received alert:  RG    MD notified (1st page):  Dr. Antoine PocheHochrein Time of first page:  0600  MD notified (2nd page):  Time of second page:  Responding MD:  Dr. Antoine PocheHochrein   Time MD responded:  [phone]

## 2013-11-19 ENCOUNTER — Inpatient Hospital Stay (HOSPITAL_COMMUNITY): Payer: Medicare Other

## 2013-11-19 DIAGNOSIS — G9349 Other encephalopathy: Secondary | ICD-10-CM

## 2013-11-19 DIAGNOSIS — R7989 Other specified abnormal findings of blood chemistry: Secondary | ICD-10-CM

## 2013-11-19 DIAGNOSIS — R778 Other specified abnormalities of plasma proteins: Secondary | ICD-10-CM | POA: Diagnosis present

## 2013-11-19 DIAGNOSIS — I071 Rheumatic tricuspid insufficiency: Secondary | ICD-10-CM | POA: Diagnosis present

## 2013-11-19 DIAGNOSIS — D72829 Elevated white blood cell count, unspecified: Secondary | ICD-10-CM

## 2013-11-19 DIAGNOSIS — I272 Pulmonary hypertension, unspecified: Secondary | ICD-10-CM | POA: Diagnosis present

## 2013-11-19 DIAGNOSIS — Z7901 Long term (current) use of anticoagulants: Secondary | ICD-10-CM

## 2013-11-19 DIAGNOSIS — E875 Hyperkalemia: Secondary | ICD-10-CM | POA: Diagnosis present

## 2013-11-19 LAB — BASIC METABOLIC PANEL
BUN: 29 mg/dL — ABNORMAL HIGH (ref 6–23)
CALCIUM: 9.7 mg/dL (ref 8.4–10.5)
CO2: 26 mEq/L (ref 19–32)
Chloride: 99 mEq/L (ref 96–112)
Creatinine, Ser: 0.77 mg/dL (ref 0.50–1.10)
GFR calc non Af Amer: 72 mL/min — ABNORMAL LOW (ref >90)
GFR, EST AFRICAN AMERICAN: 83 mL/min — AB (ref >90)
Glucose, Bld: 131 mg/dL — ABNORMAL HIGH (ref 70–99)
Potassium: 5 mEq/L (ref 3.7–5.3)
Sodium: 137 mEq/L (ref 137–147)

## 2013-11-19 LAB — BLOOD GAS, ARTERIAL
ACID-BASE EXCESS: 1.5 mmol/L (ref 0.0–2.0)
Bicarbonate: 25.6 mEq/L — ABNORMAL HIGH (ref 20.0–24.0)
Drawn by: 28337
O2 Content: 4.5 L/min
O2 SAT: 97.7 %
PO2 ART: 96.6 mmHg (ref 80.0–100.0)
Patient temperature: 98.6
TCO2: 26.9 mmol/L (ref 0–100)
pCO2 arterial: 41.3 mmHg (ref 35.0–45.0)
pH, Arterial: 7.409 (ref 7.350–7.450)

## 2013-11-19 LAB — PROTIME-INR
INR: 3.14 — AB (ref 0.00–1.49)
Prothrombin Time: 31.1 seconds — ABNORMAL HIGH (ref 11.6–15.2)

## 2013-11-19 LAB — CBC
HCT: 29.1 % — ABNORMAL LOW (ref 36.0–46.0)
Hemoglobin: 8.8 g/dL — ABNORMAL LOW (ref 12.0–15.0)
MCH: 23.4 pg — ABNORMAL LOW (ref 26.0–34.0)
MCHC: 30.2 g/dL (ref 30.0–36.0)
MCV: 77.4 fL — ABNORMAL LOW (ref 78.0–100.0)
Platelets: 240 10*3/uL (ref 150–400)
RBC: 3.76 MIL/uL — ABNORMAL LOW (ref 3.87–5.11)
RDW: 18.9 % — AB (ref 11.5–15.5)
WBC: 15.2 10*3/uL — AB (ref 4.0–10.5)

## 2013-11-19 LAB — POTASSIUM: Potassium: 5 mEq/L (ref 3.7–5.3)

## 2013-11-19 LAB — HEMOGLOBIN AND HEMATOCRIT, BLOOD
HCT: 28.7 % — ABNORMAL LOW (ref 36.0–46.0)
Hemoglobin: 8.7 g/dL — ABNORMAL LOW (ref 12.0–15.0)

## 2013-11-19 MED ORDER — PANTOPRAZOLE SODIUM 40 MG PO TBEC
40.0000 mg | DELAYED_RELEASE_TABLET | Freq: Two times a day (BID) | ORAL | Status: DC
  Administered 2013-11-19 – 2013-11-27 (×17): 40 mg via ORAL
  Filled 2013-11-19 (×16): qty 1

## 2013-11-19 NOTE — Progress Notes (Signed)
Bipap not needed at this time. BBS diminished. Pt resting comfortably at this time. No distress noted.

## 2013-11-19 NOTE — Progress Notes (Signed)
    Subjective:  Lethargic, confused, received 1mg  Ativan at 4am.  Objective:  Vital Signs in the last 24 hours: Temp:  [97.4 F (36.3 C)-98.4 F (36.9 C)] 98.4 F (36.9 C) (03/09 0805) Pulse Rate:  [86-110] 107 (03/09 0805) Resp:  [22-34] 34 (03/09 0805) BP: (99-124)/(52-78) 102/70 mmHg (03/09 0805) SpO2:  [93 %-100 %] 97 % (03/09 0805) Weight:  [101 lb 9.6 oz (46.085 kg)] 101 lb 9.6 oz (46.085 kg) (03/09 0300)  Intake/Output from previous day:  Intake/Output Summary (Last 24 hours) at 11/19/13 0926 Last data filed at 11/19/13 0010  Gross per 24 hour  Intake      0 ml  Output    525 ml  Net   -525 ml    Physical Exam: General appearance: pale and confused, lethargic Lungs: bilat rales Heart: irregularly irregular rhythm and 2-3 systolic murmur   Rate: 115  Rhythm: atrial fibrillation  Lab Results:  Recent Labs  11/17/13 2206 11/19/13 0416  WBC 9.6 15.2*  HGB 9.6* 8.8*  PLT 293 240    Recent Labs  11/17/13 2206 11/19/13 0416  NA 132* 137  K 4.1 5.0  CL 93* 99  CO2 22 26  GLUCOSE 185* 131*  BUN 17 29*  CREATININE 1.04 0.77    Recent Labs  11/18/13 0835 11/18/13 1507  TROPONINI 0.51* 0.49*    Recent Labs  11/19/13 0416  INR 3.14*    Imaging: Dg Chest Portable 1 View  11/17/2013   CLINICAL DATA:  Respiratory distress without chest pain.  EXAM: PORTABLE CHEST - 1 VIEW  COMPARISON:  03/21/2013.  FINDINGS: Cardiomegaly. Interstitial and alveolar prominence consistent with CHF. Calcified tortuous aorta. No effusion or pneumothorax. Osseous demineralization. Worsening aeration from priors.  IMPRESSION: Cardiomegaly.  CHF.   Electronically Signed   By: Davonna BellingJohn  Curnes M.D.   On: 11/17/2013 22:58    Cardiac Studies: 2D 11/18/13- Study Conclusions  - Left ventricle: The cavity size was mildly dilated. Wall thickness was normal. Systolic function was vigorous. The estimated ejection fraction was in the range of 65% to 70%. Wall motion was normal; there  were no regional wall motion abnormalities. - Aortic valve: Mild regurgitation. - Mitral valve: Severe prolapse, involving the posterior leaflet. Severe regurgitation. - Left atrium: The atrium was massively dilated. - Right atrium: The atrium was moderately to severely dilated. - Tricuspid valve: Moderate regurgitation. - Pulmonary arteries: Systolic pressure was moderately increased. PA peak pressure: 66mm Hg (S).   Assessment/Plan:   Principal Problem:   Acute on chronic diastolic HF (heart failure) Active Problems:   Severe mitral regurgitation   Elevated troponin   ANEMIA   MITRAL VALVE PROLAPSE   ATRIAL FIBRILLATION, CHRONIC   Moderate tricuspid regurgitation   Pulmonary hypertension, moderate (PA 66 mmHg 11/18/13)   Chronic anticoagulation   Hyperkalemia   HYPOTHYROIDISM   Unspecified essential hypertension   HYPERCHOLESTEROLEMIA    PLAN:  Continue IV Lasix.  D/C Ativan, use Benadryl 25 mg HS prn. Stool guaiacs pending, anemia appears to be new. Add PPI.  INR 3/14- hold Coumadin.  Stop K+, check pm K+ and Hgb.   Corine ShelterLuke Valyn Latchford PA-C Beeper [phone] 11/19/2013, 9:26 AM

## 2013-11-19 NOTE — Progress Notes (Addendum)
Pt. Seen and examined. Agree with the NP/PA-C note as written.  Difficult situation with recurrent heart failure and no good options for valve repair or replacement. This admission is complicated by anemia as well. Agree with holding warfarin (INR is supratherapeutic today). Replete lytes. Continue diuresis.  She is markedly encephalopathic today - not sure if this is due to infection, hypercapnea, benzodiazepines? Holding ativan. Check CXR and ABG.  She is mounting a leukocytosis today - concern for occult infection.  Chrystie NoseKenneth C. Kayle Passarelli, MD, [facility]ACC Attending Cardiologist [facility]

## 2013-11-19 NOTE — ED Provider Notes (Signed)
I saw and evaluated the patient, reviewed the resident's note and I agree with the findings and plan.   EKG Interpretation   Date/Time:  Saturday November 17 2013 21:45:47 EST Ventricular Rate:  111 PR Interval:    QRS Duration: 92 QT Interval:  356 QTC Calculation: 484 R Axis:   76 Text Interpretation:  Atrial fibrillation Repol abnrm suggests ischemia,  diffuse leads Minimal ST elevation, lateral leads No significant change  was found Confirmed by [facility]  MD, TREY ([id number]) on 11/17/2013 11:33:57 PM        Jonny RuizJohn Young Berryavid Sherle Mello III, MD 11/19/13 [phone]

## 2013-11-19 NOTE — Progress Notes (Signed)
ANTICOAGULATION CONSULT NOTE - Follow Up Consult  Pharmacy Consult for Coumadin Indication: atrial fibrillation  Allergies  Allergen Reactions  . Codeine Nausea Only    Also dizziness    Patient Measurements: Height: 5\' 2"  (157.5 cm) Weight: 101 lb 9.6 oz (46.085 kg) IBW/kg (Calculated) : 50.1 Heparin Dosing Weight:   Vital Signs: Temp: 98 F (36.7 C) (03/09 1257) Temp src: Axillary (03/09 1257) BP: 101/59 mmHg (03/09 1257) Pulse Rate: 101 (03/09 1257)  Labs:  Recent Labs  11/17/13 2206 11/18/13 0040 11/18/13 0420 11/18/13 0835 11/18/13 1507 11/19/13 0416  HGB 9.6*  --   --   --   --  8.8*  HCT 32.5*  --   --   --   --  29.1*  PLT 293  --   --   --   --  240  LABPROT  --  24.4*  --   --   --  31.1*  INR  --  2.28*  --   --   --  3.14*  CREATININE 1.04  --   --   --   --  0.77  TROPONINI  --   --  0.40* 0.51* 0.49*  --     Estimated Creatinine Clearance: 34 ml/min (by C-G formula based on Cr of 0.77).   Medications:  Scheduled:  . aspirin EC  81 mg Oral Daily  . digoxin  0.0625 mg Oral Daily  . feeding supplement (ENSURE COMPLETE)  237 mL Oral Q24H  . furosemide  40 mg Intravenous BID  . levothyroxine  75 mcg Oral QAC breakfast  . metoprolol  12.5 mg Oral BID  . pantoprazole  40 mg Oral BID  . simvastatin  40 mg Oral QHS  . sodium chloride  3 mL Intravenous Q12H  . Warfarin - Pharmacist Dosing Inpatient   Does not apply q1800    Assessment: 80yo female with AFib, on home dose of Coumadin.  Large jump in INR today, to 3.14.  Hg 8.8- down.  No bleeding noted, though FOB is pending.    Goal of Therapy:  INR 2-3 Monitor platelets by anticoagulation protocol: Yes   Plan:  1-  No Coumadin today 2-  Daily protime 3-  F/U stool guaiac  Marisue HumbleKendra Haeley Fordham, PharmD Clinical Pharmacist [facility]

## 2013-11-20 DIAGNOSIS — I059 Rheumatic mitral valve disease, unspecified: Secondary | ICD-10-CM

## 2013-11-20 DIAGNOSIS — I079 Rheumatic tricuspid valve disease, unspecified: Secondary | ICD-10-CM

## 2013-11-20 LAB — PROTIME-INR
INR: 3.51 — ABNORMAL HIGH (ref 0.00–1.49)
PROTHROMBIN TIME: 33.9 s — AB (ref 11.6–15.2)

## 2013-11-20 LAB — CBC
HCT: 28.9 % — ABNORMAL LOW (ref 36.0–46.0)
Hemoglobin: 8.6 g/dL — ABNORMAL LOW (ref 12.0–15.0)
MCH: 23.1 pg — ABNORMAL LOW (ref 26.0–34.0)
MCHC: 29.8 g/dL — ABNORMAL LOW (ref 30.0–36.0)
MCV: 77.7 fL — ABNORMAL LOW (ref 78.0–100.0)
Platelets: 240 10*3/uL (ref 150–400)
RBC: 3.72 MIL/uL — ABNORMAL LOW (ref 3.87–5.11)
RDW: 19.1 % — ABNORMAL HIGH (ref 11.5–15.5)
WBC: 12.5 10*3/uL — ABNORMAL HIGH (ref 4.0–10.5)

## 2013-11-20 LAB — BASIC METABOLIC PANEL
BUN: 31 mg/dL — AB (ref 6–23)
CHLORIDE: 97 meq/L (ref 96–112)
CO2: 29 mEq/L (ref 19–32)
Calcium: 10 mg/dL (ref 8.4–10.5)
Creatinine, Ser: 0.79 mg/dL (ref 0.50–1.10)
GFR calc non Af Amer: 71 mL/min — ABNORMAL LOW (ref >90)
GFR, EST AFRICAN AMERICAN: 82 mL/min — AB (ref >90)
Glucose, Bld: 117 mg/dL — ABNORMAL HIGH (ref 70–99)
POTASSIUM: 4.8 meq/L (ref 3.7–5.3)
Sodium: 137 mEq/L (ref 137–147)

## 2013-11-20 MED ORDER — LORAZEPAM 0.5 MG PO TABS: 0.5000 mg | ORAL_TABLET | Freq: Once | ORAL | Status: DC

## 2013-11-20 MED ORDER — FUROSEMIDE 10 MG/ML IJ SOLN
40.0000 mg | Freq: Once | INTRAMUSCULAR | Status: AC
  Administered 2013-11-21: 40 mg via INTRAVENOUS

## 2013-11-20 MED ORDER — METOPROLOL TARTRATE 25 MG PO TABS
25.0000 mg | ORAL_TABLET | Freq: Two times a day (BID) | ORAL | Status: DC
  Administered 2013-11-20 – 2013-11-21 (×3): 25 mg via ORAL
  Filled 2013-11-20 (×5): qty 1

## 2013-11-20 NOTE — Progress Notes (Signed)
ANTICOAGULATION CONSULT NOTE - Follow Up Consult  Pharmacy Consult for Coumadin Indication: atrial fibrillation  Allergies  Allergen Reactions  . Codeine Nausea Only    Also dizziness    Patient Measurements: Height: 5\' 2"  (157.5 cm) Weight: 101 lb 6.6 oz (46 kg) IBW/kg (Calculated) : 50.1 Heparin Dosing Weight:   Vital Signs: Temp: 97.8 F (36.6 C) (03/10 0812) Temp src: Oral (03/10 0812) BP: 106/71 mmHg (03/10 0812) Pulse Rate: 129 (03/10 0812)  Labs:  Recent Labs  11/17/13 2206 11/18/13 0040 11/18/13 0420 11/18/13 0835 11/18/13 1507 11/19/13 0416 11/19/13 1427 11/20/13 0253  HGB 9.6*  --   --   --   --  8.8* 8.7* 8.6*  HCT 32.5*  --   --   --   --  29.1* 28.7* 28.9*  PLT 293  --   --   --   --  240  --  240  LABPROT  --  24.4*  --   --   --  31.1*  --  33.9*  INR  --  2.28*  --   --   --  3.14*  --  3.51*  CREATININE 1.04  --   --   --   --  0.77  --  0.79  TROPONINI  --   --  0.40* 0.51* 0.49*  --   --   --     Estimated Creatinine Clearance: 33.9 ml/min (by C-G formula based on Cr of 0.79).   Medications:  Scheduled:  . aspirin EC  81 mg Oral Daily  . digoxin  0.0625 mg Oral Daily  . feeding supplement (ENSURE COMPLETE)  237 mL Oral Q24H  . furosemide  40 mg Intravenous BID  . levothyroxine  75 mcg Oral QAC breakfast  . LORazepam  0.5 mg Oral Once  . metoprolol  12.5 mg Oral BID  . pantoprazole  40 mg Oral BID  . simvastatin  40 mg Oral QHS  . sodium chloride  3 mL Intravenous Q12H  . Warfarin - Pharmacist Dosing Inpatient   Does not apply q1800    Assessment: 80yo female with AFib, on home dose of Coumadin.  Large jump in INR today, to 3.51.  Hg 8.6- down.  No bleeding noted, though FOB is pending.    Goal of Therapy:  INR 2-3 Monitor platelets by anticoagulation protocol: Yes   Plan:  1-  No Coumadin today 2-  Daily protime 3-  F/U stool guaiac  Thank you. Okey RegalLisa Husayn Reim, PharmD [phone]

## 2013-11-20 NOTE — Progress Notes (Addendum)
Pt. Seen and examined. Agree with the NP/PA-C note as written. Continue diuresis. Mental status clearing - avoid benzos in the future, however, she is still somnolent. Suspect this is due to reduced cardiac output - she has no appetite - EF is high, but RF is also high? I wonder if she would benefit from the addition of milrinone.  I discussed her case with [facility] Attending on call - Dr. Modesto CharonWong (only doctor that does Mitra[facility]) is out of town for 10 days. Therefore, no benefit to transfer at this time. Will continue to optimize and plan to get to a point for home d/c and [facility]. She would benefit from a consult with the Advanced HF service - I will discuss with him.  Chrystie NoseKenneth C. Deosha Werden, MD, [facility] Attending Cardiologist [facility]

## 2013-11-20 NOTE — Progress Notes (Signed)
Patient: Erin Rivers / Admit Date: 11/17/2013 / Date of Encounter: 11/20/2013, 9:42 AM Primary Cardiologist:  Subjective  Feels weak, no appetite. No CP. SOB stable. She noticed herself belly breathing earlier and this bothered her. No abd pain.   Confusion has cleared. Daughter feels she is still sleepy at times but she is awake and conversant this AM.  Objective   Telemetry: rapid afib  Physical Exam: Blood pressure 106/71, pulse 129, temperature 97.8 F (36.6 C), temperature source Oral, resp. rate 38, height 5\' 2"  (1.575 m), weight 101 lb 6.6 oz (46 kg), SpO2 100.00%. General: Frail elderly WF in no acute distress. Head: Normocephalic, atraumatic, sclera non-icteric, no xanthomas, nares are without discharge. Neck: JVP not elevated. Lungs: Diminished BS throughout. No wheezes, rales or rhonchi. Breathing is unlabored. Heart: Tachy, regular S1 S2 with 2/6 SEM across entire precordium. No rubs or gallops.  Abdomen: Soft, non-tender, non-distended with normoactive bowel sounds. No rebound/guarding. Extremities: No clubbing or cyanosis. No edema. Distal pedal pulses are 2+ and equal bilaterally. Neuro: Alert and oriented X 3. Moves all extremities spontaneously. Slightly hard of hearing Psych:  Responds to questions appropriately with a normal affect.   Intake/Output Summary (Last 24 hours) at 11/20/13 0942 Last data filed at 11/20/13 0813  Gross per 24 hour  Intake    300 ml  Output   1400 ml  Net  -1100 ml    Inpatient Medications:  . aspirin EC  81 mg Oral Daily  . digoxin  0.0625 mg Oral Daily  . feeding supplement (ENSURE COMPLETE)  237 mL Oral Q24H  . furosemide  40 mg Intravenous BID  . levothyroxine  75 mcg Oral QAC breakfast  . LORazepam  0.5 mg Oral Once  . metoprolol  12.5 mg Oral BID  . pantoprazole  40 mg Oral BID  . simvastatin  40 mg Oral QHS  . sodium chloride  3 mL Intravenous Q12H  . Warfarin - Pharmacist Dosing Inpatient   Does not apply q1800    Infusions:    Labs:  Recent Labs  11/19/13 0416 11/19/13 1427 11/20/13 0253  NA 137  --  137  K 5.0 5.0 4.8  CL 99  --  97  CO2 26  --  29  GLUCOSE 131*  --  117*  BUN 29*  --  31*  CREATININE 0.77  --  0.79  CALCIUM 9.7  --  10.0    Recent Labs  11/17/13 2206  AST 30  ALT 12  ALKPHOS 101  BILITOT 0.7  PROT 7.9  ALBUMIN 4.1    Recent Labs  11/19/13 0416 11/19/13 1427 11/20/13 0253  WBC 15.2*  --  12.5*  HGB 8.8* 8.7* 8.6*  HCT 29.1* 28.7* 28.9*  MCV 77.4*  --  77.7*  PLT 240  --  240    Recent Labs  11/18/13 0420 11/18/13 0835 11/18/13 1507  TROPONINI 0.40* 0.51* 0.49*    Radiology/Studies:  Dg Chest Port 1 View  11/19/2013   CLINICAL DATA:  Confusion, dyspnea, tachypnea.  EXAM: PORTABLE CHEST - 1 VIEW  COMPARISON:  11/17/2013  FINDINGS: Heart is enlarged. There are bilateral pleural effusions. There has been mild improvement in aeration of the upper lobes, consistent with interval improvement in pulmonary edema.  IMPRESSION: 1. Cardiomegaly and mild pulmonary edema. 2. Slight interval improvement in aeration.   Electronically Signed   By: Rosalie GumsBeth  Brown M.D.   On: 11/19/2013 15:48   Dg Chest Portable 1 View  11/17/2013   CLINICAL DATA:  Respiratory distress without chest pain.  EXAM: PORTABLE CHEST - 1 VIEW  COMPARISON:  03/21/2013.  FINDINGS: Cardiomegaly. Interstitial and alveolar prominence consistent with CHF. Calcified tortuous aorta. No effusion or pneumothorax. Osseous demineralization. Worsening aeration from priors.  IMPRESSION: Cardiomegaly.  CHF.   Electronically Signed   By: Davonna Belling M.D.   On: 11/17/2013 22:58     Assessment and Plan  1. Acute on chronic diastolic CHF with acute respiratory failure from pulmonary edema 2. Severe MR with bileaflet prolapse 3. Elevated troponin possibly due to demand ischemia 4. Encephalopathy, unclear etiology on 3/9, improved, may have been due to Ativan 5. Microcytic anemia, gradual decline since  summer 6. Chronic atrial fibrillation, elevated rates 7. Moderate TR 8. Pulm HTN moderate 9. Hypothyroidism, normal TSH this adm 10. Hypertension 11. Hyperlipidemia 12. Leukocytosis  CXR and UA negative for infection, ABG was OK. Encephalopathy may have been due to Ativan. Hgb relatively stable today but down from July as well as admission. Guaiac pending. HR remains rapid - will increase Lopressor. Follow BP's. Continue IV Lasix for now -  CXR improving but still with mild pulm edema yesterday. There is question of transferring her to [facility]. Daughter states that they avoided mitral valve procedures in the past due to doing OK on medical therapy for the meantime, and that going to [facility] caused anxiety for the patient. However, Dr. Rennis Golden feels that now may be the time to act given continued exacerbations. Have contacted [facility] transfer center who will be contacting Dr. Rennis Golden to further discuss. Consider dc ASA.  Signed, Ronie Spies PA-C

## 2013-11-21 LAB — PROTIME-INR
INR: 2.38 — ABNORMAL HIGH (ref 0.00–1.49)
PROTHROMBIN TIME: 25.2 s — AB (ref 11.6–15.2)

## 2013-11-21 LAB — BASIC METABOLIC PANEL
BUN: 31 mg/dL — ABNORMAL HIGH (ref 6–23)
CHLORIDE: 97 meq/L (ref 96–112)
CO2: 29 mEq/L (ref 19–32)
Calcium: 9.3 mg/dL (ref 8.4–10.5)
Creatinine, Ser: 0.72 mg/dL (ref 0.50–1.10)
GFR calc Af Amer: 85 mL/min — ABNORMAL LOW (ref >90)
GFR calc non Af Amer: 73 mL/min — ABNORMAL LOW (ref >90)
Glucose, Bld: 110 mg/dL — ABNORMAL HIGH (ref 70–99)
Potassium: 4.2 mEq/L (ref 3.7–5.3)
SODIUM: 138 meq/L (ref 137–147)

## 2013-11-21 MED ORDER — METOPROLOL TARTRATE 12.5 MG HALF TABLET
12.5000 mg | ORAL_TABLET | ORAL | Status: AC
  Administered 2013-11-21: 12.5 mg via ORAL
  Filled 2013-11-21: qty 1

## 2013-11-21 MED ORDER — METOPROLOL TARTRATE 25 MG PO TABS
37.5000 mg | ORAL_TABLET | Freq: Two times a day (BID) | ORAL | Status: DC
  Administered 2013-11-21 – 2013-11-22 (×2): 37.5 mg via ORAL
  Filled 2013-11-21 (×3): qty 1

## 2013-11-21 MED ORDER — WARFARIN SODIUM 1 MG PO TABS
1.0000 mg | ORAL_TABLET | Freq: Once | ORAL | Status: AC
  Administered 2013-11-21: 1 mg via ORAL
  Filled 2013-11-21: qty 1

## 2013-11-21 NOTE — Progress Notes (Signed)
Patient placed on BIPAP due to increased WOB ,increased RR and SOB.  Patient tolerating well at this time.  RN aware. RT will continue to monitor.

## 2013-11-21 NOTE — Progress Notes (Signed)
11/21/2013- 0900  RN removed Bipap to 3L [place]. RT assessed pt at 0925, Pt is sitting upright, eating breakfast, stating that she is SOB. RR 40's, HR 120's. RT will assess again post breakfast for the need of Bipap.   Christie BeckersBurnham, Ndidi Nesby Marie

## 2013-11-21 NOTE — Progress Notes (Signed)
ANTICOAGULATION CONSULT NOTE - Follow Up Consult  Pharmacy Consult for Coumadin Indication: atrial fibrillation  Allergies  Allergen Reactions  . Codeine Nausea Only    Also dizziness    Patient Measurements: Height: 5\' 2"  (157.5 cm) Weight: 97 lb (44 kg) IBW/kg (Calculated) : 50.1 Heparin Dosing Weight:   Vital Signs: Temp: 96.6 F (35.9 C) (03/11 0900) Temp src: Axillary (03/11 0900) BP: 101/81 mmHg (03/11 1104) Pulse Rate: 122 (03/11 1104)  Labs:  Recent Labs  11/18/13 1507  11/19/13 0416 11/19/13 1427 11/20/13 0253 11/21/13 0413  HGB  --   < > 8.8* 8.7* 8.6*  --   HCT  --   --  29.1* 28.7* 28.9*  --   PLT  --   --  240  --  240  --   LABPROT  --   --  31.1*  --  33.9* 25.2*  INR  --   --  3.14*  --  3.51* 2.38*  CREATININE  --   --  0.77  --  0.79 0.72  TROPONINI 0.49*  --   --   --   --   --   < > = values in this interval not displayed.  Estimated Creatinine Clearance: 32.5 ml/min (by C-G formula based on Cr of 0.72).   Medications:  Scheduled:  . aspirin EC  81 mg Oral Daily  . digoxin  0.0625 mg Oral Daily  . feeding supplement (ENSURE COMPLETE)  237 mL Oral Q24H  . furosemide  40 mg Intravenous BID  . levothyroxine  75 mcg Oral QAC breakfast  . metoprolol  37.5 mg Oral BID  . pantoprazole  40 mg Oral BID  . simvastatin  40 mg Oral QHS  . sodium chloride  3 mL Intravenous Q12H  . warfarin  1 mg Oral ONCE-1800  . Warfarin - Pharmacist Dosing Inpatient   Does not apply q1800    Assessment: 89 yof on chronic coumadin for afib. Her INR has now decreased to 2.38 today which is at goal. Doses have been skipped the last 2 days d/t rapidly increased INR. No new CBC today and no overt bleeding noted. No plan to hold coumadin per MD notes. Will resume low-dose coumadin today.   Goal of Therapy:  INR 2-3   Plan:  1. Warfarin 1mg  PO x 1 tonight 2. F/u AM INR 3. Monitor closely for S&S of bleeding  Lysle Pearlachel Vito Beg, PharmD, BCPS Pager # [phone]  11/21/2013 11:52 AM

## 2013-11-21 NOTE — Progress Notes (Signed)
Cardiologist in to see pt. Pt has been off bipap for morning and still has a little labored breathing. md aware. Also pt's heart is up to 120's-140 range. MD aware and adjusting medication. Pt is resting. Husband at bedside.

## 2013-11-21 NOTE — Progress Notes (Signed)
Pt requested Bipap placed back on at this time due to SOB. Erin Rivers, [name]

## 2013-11-21 NOTE — Progress Notes (Signed)
Patient placed on BIPAP per request. RR 34 HR 113 Spo2 96 on 3 lpm [place].  Paient tolerating BIPAP well at this time. RN aware.

## 2013-11-21 NOTE — Progress Notes (Signed)
Pt has been on and off bipap throughout day per pt request. Pt stated she had a hard time to breath and would request bipap. Oxygen saturation was always in high 90's. Husband has been at bedside. Daughter was in earlier. Pt has not had any appetite. Pt did drink Ensure drink and nurse encouraged pt to try to take a couple bites of each meal.

## 2013-11-21 NOTE — Progress Notes (Signed)
  DAILY PROGRESS NOTE  Subjective:  Required BIPAP last night for dyspnea. Maintaining sats this morning. Up and eating, but still somewhat somnolent.  She is -2.8L negative. CHF service was consulted and should be seeing her today with recommendations.  Objective:  Temp:  [96.6 F (35.9 C)-98 F (36.7 C)] 96.6 F (35.9 C) (03/11 0900) Pulse Rate:  [97-144] 119 (03/11 0931) Resp:  [21-48] 37 (03/11 0926) BP: (95-109)/(56-76) 109/76 mmHg (03/11 0931) SpO2:  [94 %-99 %] 98 % (03/11 0926) FiO2 (%):  [30 %] 30 % (03/11 0441) Weight:  [97 lb (44 kg)] 97 lb (44 kg) (03/11 0401) Weight change: -4 lb 6.6 oz (-2 kg)  Intake/Output from previous day: 03/10 0701 - 03/11 0700 In: 3 [I.V.:3] Out: 1250 [Urine:1250]  Intake/Output from this shift: Total I/O In: 3 [I.V.:3] Out: 150 [Urine:150]  Medications: Current Facility-Administered Medications  Medication Dose Route Frequency Provider Last Rate Last Dose  . 0.9 %  sodium chloride infusion  250 mL Intravenous PRN James Hochrein, MD      . aspirin EC tablet 81 mg  81 mg Oral Daily James Hochrein, MD   81 mg at 11/21/13 0930  . digoxin (LANOXIN) tablet 0.0625 mg  0.0625 mg Oral Daily James Hochrein, MD   0.0625 mg at 11/21/13 0930  . feeding supplement (ENSURE COMPLETE) (ENSURE COMPLETE) liquid 237 mL  237 mL Oral Q24H James Hochrein, MD   237 mL at 11/20/13 1709  . furosemide (LASIX) injection 40 mg  40 mg Intravenous BID James Hochrein, MD   40 mg at 11/21/13 0852  . levothyroxine (SYNTHROID, LEVOTHROID) tablet 75 mcg  75 mcg Oral QAC breakfast James Hochrein, MD   75 mcg at 11/21/13 0852  . metoprolol tartrate (LOPRESSOR) tablet 25 mg  25 mg Oral BID Dayna N Dunn, PA-C   25 mg at 11/21/13 0931  . pantoprazole (PROTONIX) EC tablet 40 mg  40 mg Oral BID Luke K Kilroy, PA-C   40 mg at 11/21/13 0931  . simvastatin (ZOCOR) tablet 40 mg  40 mg Oral QHS James Hochrein, MD   40 mg at 11/20/13 2152  . sodium chloride 0.9 % injection 3 mL  3  mL Intravenous Q12H James Hochrein, MD   3 mL at 11/21/13 0932  . sodium chloride 0.9 % injection 3 mL  3 mL Intravenous PRN James Hochrein, MD      . Warfarin - Pharmacist Dosing Inpatient   Does not apply q1800 James Hochrein, MD        Physical Exam: General appearance: alert, appears older than stated age, cachectic, no distress and more awake today Neck: JVD - 7 (prominent A wave) cm above sternal notch and no carotid bruit Lungs: diminished breath sounds bilaterally Heart: irregularly irregular rhythm and tachycardic Extremities: edema trace edema Neurologic: Mental status: Awake, more oriented this morning  Lab Results: Results for orders placed during the hospital encounter of 11/17/13 (from the past 48 hour(s))  POTASSIUM     Status: None   Collection Time    11/19/13  2:27 PM      Result Value Ref Range   Potassium 5.0  3.7 - 5.3 mEq/L  HEMOGLOBIN AND HEMATOCRIT, BLOOD     Status: Abnormal   Collection Time    11/19/13  2:27 PM      Result Value Ref Range   Hemoglobin 8.7 (*) 12.0 - 15.0 g/dL   HCT 28.7 (*) 36.0 - 46.0 %  BLOOD GAS, ARTERIAL       Status: Abnormal   Collection Time    11/19/13  3:00 PM      Result Value Ref Range   O2 Content 4.5     Delivery systems NASAL CANNULA     pH, Arterial 7.409  7.350 - 7.450   pCO2 arterial 41.3  35.0 - 45.0 mmHg   pO2, Arterial 96.6  80.0 - 100.0 mmHg   Bicarbonate 25.6 (*) 20.0 - 24.0 mEq/L   TCO2 26.9  0 - 100 mmol/L   Acid-Base Excess 1.5  0.0 - 2.0 mmol/L   O2 Saturation 97.7     Patient temperature 98.6     Collection site RIGHT RADIAL     Drawn by [id number]     Sample type ARTERIAL DRAW     Allens test (pass/fail) PASS  PASS  BASIC METABOLIC PANEL     Status: Abnormal   Collection Time    11/20/13  2:53 AM      Result Value Ref Range   Sodium 137  137 - 147 mEq/L   Potassium 4.8  3.7 - 5.3 mEq/L   Chloride 97  96 - 112 mEq/L   CO2 29  19 - 32 mEq/L   Glucose, Bld 117 (*) 70 - 99 mg/dL   BUN 31 (*) 6 - 23 mg/dL    Creatinine, Ser 0.79  0.50 - 1.10 mg/dL   Calcium 10.0  8.4 - 10.5 mg/dL   GFR calc non Af Amer 71 (*) >90 mL/min   GFR calc Af Amer 82 (*) >90 mL/min   Comment: (NOTE)     The eGFR has been calculated using the CKD EPI equation.     This calculation has not been validated in all clinical situations.     eGFR's persistently <90 mL/min signify possible Chronic Kidney     Disease.  PROTIME-INR     Status: Abnormal   Collection Time    11/20/13  2:53 AM      Result Value Ref Range   Prothrombin Time 33.9 (*) 11.6 - 15.2 seconds   INR 3.51 (*) 0.00 - 1.49  CBC     Status: Abnormal   Collection Time    11/20/13  2:53 AM      Result Value Ref Range   WBC 12.5 (*) 4.0 - 10.5 K/uL   RBC 3.72 (*) 3.87 - 5.11 MIL/uL   Hemoglobin 8.6 (*) 12.0 - 15.0 g/dL   HCT 28.9 (*) 36.0 - 46.0 %   MCV 77.7 (*) 78.0 - 100.0 fL   MCH 23.1 (*) 26.0 - 34.0 pg   MCHC 29.8 (*) 30.0 - 36.0 g/dL   RDW 19.1 (*) 11.5 - 15.5 %   Platelets 240  150 - 400 K/uL  BASIC METABOLIC PANEL     Status: Abnormal   Collection Time    11/21/13  4:13 AM      Result Value Ref Range   Sodium 138  137 - 147 mEq/L   Potassium 4.2  3.7 - 5.3 mEq/L   Chloride 97  96 - 112 mEq/L   CO2 29  19 - 32 mEq/L   Glucose, Bld 110 (*) 70 - 99 mg/dL   BUN 31 (*) 6 - 23 mg/dL   Creatinine, Ser 0.72  0.50 - 1.10 mg/dL   Calcium 9.3  8.4 - 10.5 mg/dL   GFR calc non Af Amer 73 (*) >90 mL/min   GFR calc Af Amer 85 (*) >90 mL/min   Comment: (NOTE)       The eGFR has been calculated using the CKD EPI equation.     This calculation has not been validated in all clinical situations.     eGFR's persistently <90 mL/min signify possible Chronic Kidney     Disease.  PROTIME-INR     Status: Abnormal   Collection Time    11/21/13  4:13 AM      Result Value Ref Range   Prothrombin Time 25.2 (*) 11.6 - 15.2 seconds   INR 2.38 (*) 0.00 - 1.49    Imaging: Dg Chest Port 1 View  11/19/2013   CLINICAL DATA:  Confusion, dyspnea, tachypnea.  EXAM:  PORTABLE CHEST - 1 VIEW  COMPARISON:  11/17/2013  FINDINGS: Heart is enlarged. There are bilateral pleural effusions. There has been mild improvement in aeration of the upper lobes, consistent with interval improvement in pulmonary edema.  IMPRESSION: 1. Cardiomegaly and mild pulmonary edema. 2. Slight interval improvement in aeration.   Electronically Signed   By: Beth  Brown M.D.   On: 11/19/2013 15:48    Assessment:  1. Principal Problem: 2.   Acute on chronic diastolic HF (heart failure) 3. Active Problems: 4.   HYPOTHYROIDISM 5.   ANEMIA 6.   Severe mitral regurgitation 7.   Unspecified essential hypertension 8.   MITRAL VALVE PROLAPSE 9.   ATRIAL FIBRILLATION, CHRONIC 10.   HYPERCHOLESTEROLEMIA 11.   Moderate tricuspid regurgitation 12.   Pulmonary hypertension, moderate (PA 66 mmHg 11/18/13) 13.   Chronic anticoagulation 14.   Elevated troponin 15.   Hyperkalemia 16.   Plan:  1. Continues with RVR - could stand better HR control.  Seems to be responding to diuresis. More alert today and appetite has returned. Plan for CHF service consult today - ?RHC for cardiac output or simply Co-ox. Consideration for outpatient evaluation at [facility] for MitraClip.  A palliative option has been discussed with her - she realizes there are not many good options, but at this point she wishes to continue aggressive care.  Time Spent Directly with Patient:  15 minutes  Length of Stay:  LOS: 4 days   Kenneth C. Hilty, MD, FACC Attending Cardiologist [facility]  HILTY,Kenneth C 11/21/2013, 9:50 AM     

## 2013-11-21 NOTE — Progress Notes (Signed)
DAILY PROGRESS NOTE  Subjective:  Required BIPAP last night for dyspnea. Maintaining sats this morning. Up and eating, but still somewhat somnolent.  She is -2.8L negative. CHF service was consulted and should be seeing her today with recommendations.  Objective:  Temp:  [96.6 F (35.9 C)-97.8 F (36.6 C)] 96.6 F (35.9 C) (03/11 0900) Pulse Rate:  [97-144] 105 (03/11 1330) Resp:  [21-48] 34 (03/11 1330) BP: (95-109)/(56-81) 107/72 mmHg (03/11 1330) SpO2:  [94 %-99 %] 99 % (03/11 1330) FiO2 (%):  [30 %] 30 % (03/11 1104) Weight:  [44 kg (97 lb)] 44 kg (97 lb) (03/11 0401) Weight change: -2 kg (-4 lb 6.6 oz)  Intake/Output from previous day: 03/10 0701 - 03/11 0700 In: 3 [I.V.:3] Out: 1250 [Urine:1250]  Intake/Output from this shift: Total I/O In: 3 [I.V.:3] Out: 150 [Urine:150]  Medications: Current Facility-Administered Medications  Medication Dose Route Frequency Provider Last Rate Last Dose  . 0.9 %  sodium chloride infusion  250 mL Intravenous PRN Minus Breeding, MD      . aspirin EC tablet 81 mg  81 mg Oral Daily Minus Breeding, MD   81 mg at 11/21/13 0930  . digoxin (LANOXIN) tablet 0.0625 mg  0.0625 mg Oral Daily Minus Breeding, MD   0.0625 mg at 11/21/13 0930  . feeding supplement (ENSURE COMPLETE) (ENSURE COMPLETE) liquid 237 mL  237 mL Oral Q24H Minus Breeding, MD   237 mL at 11/21/13 1504  . furosemide (LASIX) injection 40 mg  40 mg Intravenous BID Minus Breeding, MD   40 mg at 11/21/13 4854  . levothyroxine (SYNTHROID, LEVOTHROID) tablet 75 mcg  75 mcg Oral QAC breakfast Minus Breeding, MD   75 mcg at 11/21/13 [phone]  . metoprolol tartrate (LOPRESSOR) tablet 37.5 mg  37.5 mg Oral BID Pixie Casino, MD      . pantoprazole (PROTONIX) EC tablet 40 mg  40 mg Oral BID Erlene Quan, PA-C   40 mg at 11/21/13 0931  . simvastatin (ZOCOR) tablet 40 mg  40 mg Oral QHS Minus Breeding, MD   40 mg at 11/20/13 2152  . sodium chloride 0.9 % injection 3 mL  3 mL  Intravenous Q12H Minus Breeding, MD   3 mL at 11/21/13 0932  . sodium chloride 0.9 % injection 3 mL  3 mL Intravenous PRN Minus Breeding, MD      . warfarin (COUMADIN) tablet 1 mg  1 mg Oral ONCE-1800 Rande Lawman [place], [facility]      . Warfarin - Pharmacist Dosing Inpatient   Does not apply q1800 Minus Breeding, MD        Physical Exam: General appearance: frail cachectic, no distress and more awake today Kyphotic Neck: JVD - 9cm above sternal notch and no carotid bruit Lungs: diminished breath sounds bilaterally particularly at the bases Heart: irregularly irregular rhythm and tachycardic 3/6 MR Extremities: 1+ edema Neurologic: Mental status: alert and oriented x3  Hard of hearing  Otherwise nonfocal  Lab Results: Results for orders placed during the hospital encounter of 11/17/13 (from the past 48 hour(s))  BASIC METABOLIC PANEL     Status: Abnormal   Collection Time    11/20/13  2:53 AM      Result Value Ref Range   Sodium 137  137 - 147 mEq/L   Potassium 4.8  3.7 - 5.3 mEq/L   Chloride 97  96 - 112 mEq/L   CO2 29  19 - 32 mEq/L   Glucose, Bld 117 (*)  70 - 99 mg/dL   BUN 31 (*) 6 - 23 mg/dL   Creatinine, Ser 0.79  0.50 - 1.10 mg/dL   Calcium 10.0  8.4 - 10.5 mg/dL   GFR calc non Af Amer 71 (*) >90 mL/min   GFR calc Af Amer 82 (*) >90 mL/min   Comment: (NOTE)     The eGFR has been calculated using the CKD EPI equation.     This calculation has not been validated in all clinical situations.     eGFR's persistently <90 mL/min signify possible Chronic Kidney     Disease.  PROTIME-INR     Status: Abnormal   Collection Time    11/20/13  2:53 AM      Result Value Ref Range   Prothrombin Time 33.9 (*) 11.6 - 15.2 seconds   INR 3.51 (*) 0.00 - 1.49  CBC     Status: Abnormal   Collection Time    11/20/13  2:53 AM      Result Value Ref Range   WBC 12.5 (*) 4.0 - 10.5 K/uL   RBC 3.72 (*) 3.87 - 5.11 MIL/uL   Hemoglobin 8.6 (*) 12.0 - 15.0 g/dL   HCT 28.9 (*) 36.0 - 46.0 %    MCV 77.7 (*) 78.0 - 100.0 fL   MCH 23.1 (*) 26.0 - 34.0 pg   MCHC 29.8 (*) 30.0 - 36.0 g/dL   RDW 19.1 (*) 11.5 - 15.5 %   Platelets 240  150 - 400 K/uL  BASIC METABOLIC PANEL     Status: Abnormal   Collection Time    11/21/13  4:13 AM      Result Value Ref Range   Sodium 138  137 - 147 mEq/L   Potassium 4.2  3.7 - 5.3 mEq/L   Chloride 97  96 - 112 mEq/L   CO2 29  19 - 32 mEq/L   Glucose, Bld 110 (*) 70 - 99 mg/dL   BUN 31 (*) 6 - 23 mg/dL   Creatinine, Ser 0.72  0.50 - 1.10 mg/dL   Calcium 9.3  8.4 - 10.5 mg/dL   GFR calc non Af Amer 73 (*) >90 mL/min   GFR calc Af Amer 85 (*) >90 mL/min   Comment: (NOTE)     The eGFR has been calculated using the CKD EPI equation.     This calculation has not been validated in all clinical situations.     eGFR's persistently <90 mL/min signify possible Chronic Kidney     Disease.  PROTIME-INR     Status: Abnormal   Collection Time    11/21/13  4:13 AM      Result Value Ref Range   Prothrombin Time 25.2 (*) 11.6 - 15.2 seconds   INR 2.38 (*) 0.00 - 1.49    Imaging: No results found.  Assessment:  1. A/c diastolic/valvular HF 2. Severe mitral regurgitation due to MVP 3. Chronic AF now with RVR 4. Massive LA dialtion 5. Pulmonary HTN (RVSP 59mHG echo 11/18/13) 6. Physical deconditioning 7. Acute respiratory failure due to pulmonary edema.  Difficult situation. She is very tenuous with severe MR and AF now with RVR. [facility] and felt not to be candidate for surgical MVR. She was then referred to Dr. WMina Marblefor consideration of MV clip and he felt that she was doing well enough with medical management at the time to defer the decision about the clip.  She was admitted a  few days ago with pulmonary edema. Now somewhat improved with diuresis though she needed BiPAP last night. Prior to this hospitalization she has done well and has been fairly independent though I feel her overall condition has declined  markedly in the last few weeks.   I talked with her and her family about the options. I told them I felt the best option would be close f/u in the HF Clinic with an attempt to optimize her fluid status with a very tight sliding scale diuretic regimen. I also explained that her euvolemic window may be very small and we may have trouble keeping her comfortable. I also talked to them about the MV clip procedure and expressed my concern that it may carry considerable risk for a woman of her size and age. Finally we talked about her Code Status and possibility of involving Hospice at some point.   For now we will continue with IV diuresis one more day. Agree with increasing lopressor to help control AF rate.  Will consult PT and also provide her with an incentive spirometer.   We will continue to follow her and continue these discussions throughout her hospitalization.   Total time spent 50 minutes with over half that time discussing above.    Length of Stay:  LOS: 4 days   Glori Bickers MD 11/21/2013, 4:35 PM

## 2013-11-21 NOTE — Progress Notes (Signed)
Paged cardiologist because pt. Stated she can not catch her breath. O2 stat was WNL, wheezing was heard throughout her lungs. No prn breathing treatments were ordered and Dr. Was unable to order ativan due to previously being somnolent. Pt requested to be placed back on the bipap. Respiratory was called and pt was placed back on the bipap. Will continue to monitor.

## 2013-11-22 ENCOUNTER — Other Ambulatory Visit: Payer: Self-pay | Admitting: Pulmonary Disease

## 2013-11-22 LAB — BASIC METABOLIC PANEL
BUN: 36 mg/dL — ABNORMAL HIGH (ref 6–23)
CALCIUM: 9.4 mg/dL (ref 8.4–10.5)
CO2: 29 mEq/L (ref 19–32)
CREATININE: 0.8 mg/dL (ref 0.50–1.10)
Chloride: 89 mEq/L — ABNORMAL LOW (ref 96–112)
GFR, EST AFRICAN AMERICAN: 73 mL/min — AB (ref >90)
GFR, EST NON AFRICAN AMERICAN: 63 mL/min — AB (ref >90)
Glucose, Bld: 113 mg/dL — ABNORMAL HIGH (ref 70–99)
Potassium: 4.3 mEq/L (ref 3.7–5.3)
Sodium: 134 mEq/L — ABNORMAL LOW (ref 137–147)

## 2013-11-22 LAB — PROTIME-INR
INR: 1.76 — AB (ref 0.00–1.49)
PROTHROMBIN TIME: 20 s — AB (ref 11.6–15.2)

## 2013-11-22 MED ORDER — METOPROLOL TARTRATE 50 MG PO TABS
50.0000 mg | ORAL_TABLET | Freq: Two times a day (BID) | ORAL | Status: DC
  Administered 2013-11-22 – 2013-11-27 (×10): 50 mg via ORAL
  Filled 2013-11-22 (×11): qty 1

## 2013-11-22 MED ORDER — METOPROLOL TARTRATE 12.5 MG HALF TABLET
12.5000 mg | ORAL_TABLET | Freq: Once | ORAL | Status: AC
  Administered 2013-11-22: 12.5 mg via ORAL
  Filled 2013-11-22: qty 1

## 2013-11-22 MED ORDER — WARFARIN SODIUM 2.5 MG PO TABS
2.5000 mg | ORAL_TABLET | Freq: Once | ORAL | Status: AC
  Administered 2013-11-22: 2.5 mg via ORAL
  Filled 2013-11-22: qty 1

## 2013-11-22 MED ORDER — METOLAZONE 2.5 MG PO TABS
2.5000 mg | ORAL_TABLET | Freq: Once | ORAL | Status: AC
  Administered 2013-11-22: 2.5 mg via ORAL
  Filled 2013-11-22: qty 1

## 2013-11-22 MED ORDER — METOLAZONE 2.5 MG PO TABS
2.5000 mg | ORAL_TABLET | Freq: Once | ORAL | Status: DC
  Filled 2013-11-22: qty 1

## 2013-11-22 NOTE — Evaluation (Signed)
Physical Therapy Evaluation Patient Details Name: Derek MoundMoira G Weist MRN: [id number] DOB: Apr 19, 1921 Today's Date: 11/22/2013 Time: [id number] PT Time Calculation (min): 39 min  PT Assessment / Plan / Recommendation History of Present Illness  80 year old woman with hx of CHF and severe MR admitted with sudden onset of SOB.  Pt has been on and off bipap since admission.  Clinical Impression  Pt admitted with above.   Pt currently with functional limitations due to the deficits listed below (see PT Problem List). Pt and family trying to decide whether they want SNF for short term Rehab and then back to I living.  This PT feels this is appropriate if husband feels he cannot provide initial 24 hour care.   Pt will benefit from skilled PT to increase their independence and safety with mobility to allow discharge to the venue listed below.     PT Assessment  Patient needs continued PT services    Follow Up Recommendations  SNF;Supervision/Assistance - 24 hour (Husband unsure if he can provide 24 hour care initially- short term NH )    Does the patient have the potential to tolerate intense rehabilitation      Barriers to Discharge        Equipment Recommendations  3in1 (PT);Other (comment) (RW with 4 wheels and seat, may need home O2)    Recommendations for Other Services     Frequency Min 3X/week    Precautions / Restrictions Precautions Precautions: Fall Precaution Comments: watch HR and 02 sats Restrictions Weight Bearing Restrictions: No   Pertinent Vitals/Pain Desat to 88% on RA.  Replaced O2 at 3L.  Reviewed incentive spirometer use.  HR into high 130's with activity.  No pain.       Mobility  Bed Mobility Overal bed mobility: Needs Assistance Bed Mobility: Supine to Sit Supine to sit: Min assist;HOB elevated General bed mobility comments: extra time, reaches for therapist's hand Transfers Overall transfer level: Needs assistance Equipment used: None Transfers: Sit to/from  Stand Sit to Stand: +2 physical assistance;Mod assist General transfer comment: assist to power up and control descent to chair Ambulation/Gait Ambulation/Gait assistance: Mod assist;+2 safety/equipment Ambulation Distance (Feet): 15 Feet Assistive device: 1 person hand held assist Gait Pattern/deviations: Step-through pattern;Decreased step length - right;Decreased step length - left;Shuffle;Trunk flexed;Wide base of support Gait velocity interpretation: Below normal speed for age/gender General Gait Details: Pt needed assist for postural stabiity as her stability gets worse as she fatigues.  Needs steadying assist throughout.      Exercises     PT Diagnosis: Generalized weakness  PT Problem List: Decreased activity tolerance;Decreased strength;Decreased balance;Decreased mobility;Decreased knowledge of use of DME;Decreased safety awareness;Decreased knowledge of precautions PT Treatment Interventions: DME instruction;Gait training;Functional mobility training;Therapeutic activities;Therapeutic exercise;Balance training;Patient/family education     PT Goals(Current goals can be found in the care plan section) Acute Rehab PT Goals Patient Stated Goal: Return to PLOF. PT Goal Formulation: With patient Time For Goal Achievement: 11/29/13 Potential to Achieve Goals: Good  Visit Information  Last PT Received On: 11/22/13 Assistance Needed: +2 PT/OT/SLP Co-Evaluation/Treatment: Yes Reason for Co-Treatment: Complexity of the patient's impairments (multi-system involvement);For patient/therapist safety PT goals addressed during session: Mobility/safety with mobility OT goals addressed during session: ADL's and self-care History of Present Illness: 80 year old woman with hx of CHF and severe MR admitted with sudden onset of SOB.  Pt has been on and off bipap since admission.       Prior Functioning  Home Living Family/patient expects  to be discharged to:: Private residence Living  Arrangements: Spouse/significant other Available Help at Discharge: Family;Available 24 hours/day Type of Home: Independent living facility ([facility]) Home Access: Elevator;Level entry Home Layout: One level Home Equipment: Grab bars - tub/shower;Grab bars - toilet;Hand held shower head Prior Function Level of Independence: Independent Comments: pt was driving and ambulating to dining room at [facility] Communication Communication: HOH Dominant Hand: Right    Cognition  Cognition Arousal/Alertness: Awake/alert Behavior During Therapy: WFL for tasks assessed/performed Overall Cognitive Status: Within Functional Limits for tasks assessed    Extremity/Trunk Assessment Upper Extremity Assessment Upper Extremity Assessment: Generalized weakness Lower Extremity Assessment Lower Extremity Assessment: Defer to PT evaluation Cervical / Trunk Assessment Cervical / Trunk Assessment: Kyphotic   Balance Balance Overall balance assessment: Needs assistance Sitting-balance support: Feet supported Sitting balance-Leahy Scale: Good Postural control: Posterior lean Standing balance support: Bilateral upper extremity supported Standing balance-Leahy Scale: Poor Standing balance comment: Needs UE support for stability.  End of Session PT - End of Session Equipment Utilized During Treatment: Gait belt;Other (comment) Activity Tolerance: Patient limited by fatigue Patient left: in chair;with call bell/phone within reach;with family/visitor present Nurse Communication: Mobility status  GP     INGOLD,Wyllow Seigler 11/22/2013, 11:24 AM Audree Camel Acute Rehabilitation [phone] (pager)

## 2013-11-22 NOTE — Evaluation (Signed)
Occupational Therapy Evaluation Patient Details Name: Erin MoundMoira G Hines MRN: [id number] DOB: 05-11-1921 Today's Date: 11/22/2013 Time: [id number] OT Time Calculation (min): 40 min  OT Assessment / Plan / Recommendation History of present illness 80 year old woman with hx of CHF and severe MR admitted with sudden onset of SOB.  Pt has been on and off bipap since admission.   Clinical Impression   Pt appears frail with low activity tolerance and impaired balance interfering with ability to perform ADL.  Pt with HR to 138 with ambulation/standing and requiring 3L 02 to maintain sats in 90s.  Had conversation with pt and family regarding benefits of post acute rehab to build strength, endurance, mobility and independence in ADL.  Will follow acutely.    OT Assessment  Patient needs continued OT Services    Follow Up Recommendations  SNF;Supervision/Assistance - 24 hour ([facility]    Barriers to Discharge      Equipment Recommendations  3 in 1 bedside comode    Recommendations for Other Services    Frequency  Min 2X/week    Precautions / Restrictions Precautions Precautions: Fall Precaution Comments: watch HR and 02 sats Restrictions Weight Bearing Restrictions: No   Pertinent Vitals/Pain 02 mid to upper 90s on 3l, down to 88% on RA, no pain reported, encouraged incentive spirometer    ADL  Eating/Feeding: Independent Where Assessed - Eating/Feeding: Chair Grooming: Wash/dry hands;Wash/dry face;Teeth care;Set up Where Assessed - Grooming: Supported sitting Upper Body Bathing: Minimal assistance Where Assessed - Upper Body Bathing: Unsupported sitting Lower Body Bathing: Maximal assistance Where Assessed - Lower Body Bathing: Unsupported sitting;Supported sit to stand Upper Body Dressing: Minimal assistance Where Assessed - Upper Body Dressing: Unsupported sitting Lower Body Dressing: Maximal assistance Where Assessed - Lower Body Dressing: Unsupported sitting;Supported  sit to stand Toilet Transfer: +2 Total assistance;Moderate assistance Toilet Transfer Method: Sit to stand Equipment Used:  (02) Transfers/Ambulation Related to ADLs: +2 min to mod assist ADL Comments: Pt unable to tolerate standing at sink for grooming, leans over to support herself.  Low activity tolerance    OT Diagnosis: Generalized weakness  OT Problem List: Decreased strength;Decreased activity tolerance;Impaired balance (sitting and/or standing);Decreased knowledge of use of DME or AE;Cardiopulmonary status limiting activity OT Treatment Interventions: DME and/or AE instruction;Self-care/ADL training;Therapeutic activities;Patient/family education;Balance training;Energy conservation   OT Goals(Current goals can be found in the care plan section) Acute Rehab OT Goals Patient Stated Goal: Return to PLOF. OT Goal Formulation: With patient Time For Goal Achievement: 12/06/13 Potential to Achieve Goals: Good ADL Goals Pt Will Perform Grooming: with supervision;standing (2 activities) Pt Will Perform Upper Body Bathing: with supervision;sitting Pt Will Perform Lower Body Bathing: with supervision;sit to/from stand Pt Will Perform Upper Body Dressing: with supervision;sitting Pt Will Perform Lower Body Dressing: with supervision;sit to/from stand Pt Will Transfer to Toilet: with supervision;ambulating;bedside commode (with RW, 3 in1 over toilet) Pt Will Perform Toileting - Clothing Manipulation and hygiene: with supervision;sit to/from stand Additional ADL Goal #1: Pt will utilize breathing techniques and energy conservation strategies in mobility and ADL with minimal verbal cues.  Visit Information  Last OT Received On: 11/22/13 Assistance Needed: +2 PT/OT/SLP Co-Evaluation/Treatment: Yes Reason for Co-Treatment: Complexity of the patient's impairments (multi-system involvement);For patient/therapist safety OT goals addressed during session: ADL's and self-care History of Present  Illness: 80 year old woman with hx of CHF and severe MR admitted with sudden onset of SOB.  Pt has been on and off bipap since admission.  Prior Functioning     Home Living Family/patient expects to be discharged to:: Private residence Living Arrangements: Spouse/significant other Available Help at Discharge: Family;Available 24 hours/day Type of Home: Independent living facility ([facility]) Home Access: Elevator;Level entry Home Layout: One level Home Equipment: Grab bars - tub/shower;Grab bars - toilet;Hand held shower head Prior Function Level of Independence: Independent Comments: pt was driving and ambulating to dining room at [facility]F Communication Communication: HOH Dominant Hand: Right         Vision/Perception Vision - History Baseline Vision: Wears glasses all the time Visual History: Other (comment) (uses magnifying glass)   Cognition  Cognition Arousal/Alertness: Awake/alert Behavior During Therapy: WFL for tasks assessed/performed Overall Cognitive Status: Within Functional Limits for tasks assessed    Extremity/Trunk Assessment Upper Extremity Assessment Upper Extremity Assessment: Generalized weakness Lower Extremity Assessment Lower Extremity Assessment: Defer to PT evaluation Cervical / Trunk Assessment Cervical / Trunk Assessment: Kyphotic     Mobility Bed Mobility Overal bed mobility: Needs Assistance Bed Mobility: Supine to Sit Supine to sit: Min assist;HOB elevated General bed mobility comments: extra time, reaches for therapist's hand Transfers Overall transfer level: Needs assistance Transfers: Sit to/from Stand Sit to Stand: +2 physical assistance;Mod assist General transfer comment: assist to power up and control descent to chair     Exercise     Balance Balance Overall balance assessment: Needs assistance Sitting-balance support: Feet supported Sitting balance-Leahy Scale: Good Standing balance support: Bilateral upper  extremity supported Standing balance-Leahy Scale: Poor   End of Session OT - End of Session Activity Tolerance: Patient limited by fatigue Patient left: in chair;with call bell/phone within reach;with family/visitor present Nurse Communication: Mobility status  GO     Evern Bio 11/22/2013, 11:11 AM [phone]

## 2013-11-22 NOTE — Progress Notes (Addendum)
DAILY PROGRESS NOTE  Subjective:   Breathing better. Up to chair with PT. AF rate improved ~100. Weight unchanged.   Objective:  Temp:  [97.5 F (36.4 C)-98.3 F (36.8 C)] 97.8 F (36.6 C) (03/12 1117) Pulse Rate:  [87-150] 110 (03/12 1200) Resp:  [16-42] 28 (03/12 0800) BP: (89-115)/(48-89) 95/72 mmHg (03/12 1200) SpO2:  [96 %-100 %] 97 % (03/12 0800) FiO2 (%):  [30 %] 30 % (03/12 0408) Weight:  [44 kg (97 lb)] 44 kg (97 lb) (03/12 0400) Weight change: 0 kg (0 lb)  Intake/Output from previous day: 03/11 0701 - 03/12 0700 In: 256 [P.O.:250; I.V.:6] Out: 725 [Urine:725]  Intake/Output from this shift: Total I/O In: 3 [I.V.:3] Out: -   Medications: Current Facility-Administered Medications  Medication Dose Route Frequency Provider Last Rate Last Dose  . 0.9 %  sodium chloride infusion  250 mL Intravenous PRN Minus Breeding, MD      . aspirin EC tablet 81 mg  81 mg Oral Daily Minus Breeding, MD   81 mg at 11/22/13 0902  . digoxin (LANOXIN) tablet 0.0625 mg  0.0625 mg Oral Daily Minus Breeding, MD   0.0625 mg at 11/22/13 0901  . feeding supplement (ENSURE COMPLETE) (ENSURE COMPLETE) liquid 237 mL  237 mL Oral Q24H Minus Breeding, MD   237 mL at 11/21/13 1504  . furosemide (LASIX) injection 40 mg  40 mg Intravenous BID Minus Breeding, MD   40 mg at 11/22/13 0856  . levothyroxine (SYNTHROID, LEVOTHROID) tablet 75 mcg  75 mcg Oral QAC breakfast Minus Breeding, MD   75 mcg at 11/22/13 0857  . metoprolol (LOPRESSOR) tablet 50 mg  50 mg Oral BID Jolaine Artist, MD      . pantoprazole (PROTONIX) EC tablet 40 mg  40 mg Oral BID Erlene Quan, PA-C   40 mg at 11/22/13 0902  . simvastatin (ZOCOR) tablet 40 mg  40 mg Oral QHS Minus Breeding, MD   40 mg at 11/21/13 2158  . sodium chloride 0.9 % injection 3 mL  3 mL Intravenous Q12H Minus Breeding, MD   3 mL at 11/22/13 0903  . sodium chloride 0.9 % injection 3 mL  3 mL Intravenous PRN Minus Breeding, MD      . warfarin  (COUMADIN) tablet 2.5 mg  2.5 mg Oral ONCE-1800 Gay Filler Carney, RPH      . Warfarin - Pharmacist Dosing Inpatient   Does not apply [id number] Minus Breeding, MD   1 each at 11/21/13 1800    Physical Exam: General appearance: frail cachectic, sitting in chair Kyphotic Neck: JVD - 9cm above sternal notch and no carotid bruit Lungs: diminished breath sounds bilaterally particularly at the bases Heart: irregularly irregular rhythm and tachycardic 3/6 MR Extremities: warm trace edema Neurologic: Mental status: alert and oriented x3  Hard of hearing  Otherwise nonfocal  Lab Results: Results for orders placed during the hospital encounter of 11/17/13 (from the past 48 hour(s))  BASIC METABOLIC PANEL     Status: Abnormal   Collection Time    11/21/13  4:13 AM      Result Value Ref Range   Sodium 138  137 - 147 mEq/L   Potassium 4.2  3.7 - 5.3 mEq/L   Chloride 97  96 - 112 mEq/L   CO2 29  19 - 32 mEq/L   Glucose, Bld 110 (*) 70 - 99 mg/dL   BUN 31 (*) 6 - 23 mg/dL   Creatinine, Ser 0.72  0.50 - 1.10 mg/dL   Calcium 9.3  8.4 - 10.5 mg/dL   GFR calc non Af Amer 73 (*) >90 mL/min   GFR calc Af Amer 85 (*) >90 mL/min   Comment: (NOTE)     The eGFR has been calculated using the CKD EPI equation.     This calculation has not been validated in all clinical situations.     eGFR's persistently <90 mL/min signify possible Chronic Kidney     Disease.  PROTIME-INR     Status: Abnormal   Collection Time    11/21/13  4:13 AM      Result Value Ref Range   Prothrombin Time 25.2 (*) 11.6 - 15.2 seconds   INR 2.38 (*) 0.00 - 1.49  PROTIME-INR     Status: Abnormal   Collection Time    11/22/13  3:16 AM      Result Value Ref Range   Prothrombin Time 20.0 (*) 11.6 - 15.2 seconds   INR 1.76 (*) 0.00 - 1.49    Imaging: No results found.  Assessment:  1. A/c diastolic/valvular HF 2. Severe mitral regurgitation due to MVP 3. Chronic AF now with RVR 4. Massive LA dialtion 5. Pulmonary HTN (RVSP  18mHG echo 11/18/13) 6. Physical deconditioning 7. Acute respiratory failure due to pulmonary edema.  Volume status still mildly elevated. Would like to see her down about 2 more pounds. Will add metolazone today. Pull Foley soon.   Increase lopressor to 50 bid.  Continue PT. May need SNF.   Reviewed use of incentive spirometer.   We are discussing her Code Status. Full Code for now.    Length of Stay:  LOS: 5 days   DGlori BickersMD 11/22/2013, 12:14 PM

## 2013-11-22 NOTE — Progress Notes (Signed)
ANTICOAGULATION CONSULT NOTE - Follow Up Consult  Pharmacy Consult for Coumadin Indication: atrial fibrillation  Allergies  Allergen Reactions  . Codeine Nausea Only    Also dizziness    Patient Measurements: Height: 5\' 2"  (157.5 cm) Weight: 97 lb (44 kg) IBW/kg (Calculated) : 50.1 Heparin Dosing Weight:   Vital Signs: Temp: 97.5 F (36.4 C) (03/12 0720) Temp src: Oral (03/12 0720) BP: 115/81 mmHg (03/12 0720) Pulse Rate: 126 (03/12 0720)  Labs:  Recent Labs  11/19/13 1427 11/20/13 0253 11/21/13 0413 11/22/13 0316  HGB 8.7* 8.6*  --   --   HCT 28.7* 28.9*  --   --   PLT  --  240  --   --   LABPROT  --  33.9* 25.2* 20.0*  INR  --  3.51* 2.38* 1.76*  CREATININE  --  0.79 0.72  --     Estimated Creatinine Clearance: 32.5 ml/min (by C-G formula based on Cr of 0.72).   Medications:  Scheduled:  . aspirin EC  81 mg Oral Daily  . digoxin  0.0625 mg Oral Daily  . feeding supplement (ENSURE COMPLETE)  237 mL Oral Q24H  . furosemide  40 mg Intravenous BID  . levothyroxine  75 mcg Oral QAC breakfast  . metoprolol  37.5 mg Oral BID  . pantoprazole  40 mg Oral BID  . simvastatin  40 mg Oral QHS  . sodium chloride  3 mL Intravenous Q12H  . Warfarin - Pharmacist Dosing Inpatient   Does not apply q1800    Assessment: 89 yof on chronic coumadin for afib. Her INR has now decreased to slightly subtherapeutic. Doses were held 3/9 and 3/10  d/t rapidly increased INR. No new CBC today and no overt bleeding noted.   Goal of Therapy:  INR 2-3   Plan:  1. Warfarin 2.5 mg PO x 1 tonight 2. F/u AM INR 3. Monitor closely for S&S of bleeding  Tad MooreJessica Lambert Jeanty, Pharm D, BCPS  Clinical Pharmacist Pager [phone]  11/22/2013 7:45 AM

## 2013-11-22 NOTE — Progress Notes (Signed)
Placed patient on BIPAP per request.  Patient Sp02 97% on 2 lpm [place].  Patient has slight increased WOB, states "she just feels like maybe she isn't getting enough oxygen".

## 2013-11-23 ENCOUNTER — Other Ambulatory Visit: Payer: Self-pay | Admitting: Pulmonary Disease

## 2013-11-23 DIAGNOSIS — I10 Essential (primary) hypertension: Secondary | ICD-10-CM

## 2013-11-23 DIAGNOSIS — E039 Hypothyroidism, unspecified: Secondary | ICD-10-CM

## 2013-11-23 LAB — PROTIME-INR
INR: 1.71 — ABNORMAL HIGH (ref 0.00–1.49)
PROTHROMBIN TIME: 19.6 s — AB (ref 11.6–15.2)

## 2013-11-23 LAB — BASIC METABOLIC PANEL
BUN: 37 mg/dL — AB (ref 6–23)
CO2: 32 mEq/L (ref 19–32)
CREATININE: 0.89 mg/dL (ref 0.50–1.10)
Calcium: 9.7 mg/dL (ref 8.4–10.5)
Chloride: 91 mEq/L — ABNORMAL LOW (ref 96–112)
GFR calc Af Amer: 64 mL/min — ABNORMAL LOW (ref >90)
GFR, EST NON AFRICAN AMERICAN: 55 mL/min — AB (ref >90)
Glucose, Bld: 129 mg/dL — ABNORMAL HIGH (ref 70–99)
Potassium: 3.5 mEq/L — ABNORMAL LOW (ref 3.7–5.3)
Sodium: 137 mEq/L (ref 137–147)

## 2013-11-23 MED ORDER — FUROSEMIDE 10 MG/ML IJ SOLN
40.0000 mg | Freq: Once | INTRAMUSCULAR | Status: AC
  Administered 2013-11-23: 40 mg via INTRAVENOUS
  Filled 2013-11-23: qty 4

## 2013-11-23 MED ORDER — FUROSEMIDE 10 MG/ML IJ SOLN
80.0000 mg | Freq: Two times a day (BID) | INTRAMUSCULAR | Status: DC
  Administered 2013-11-23 – 2013-11-24 (×2): 80 mg via INTRAVENOUS
  Filled 2013-11-23 (×3): qty 8

## 2013-11-23 MED ORDER — METOLAZONE 2.5 MG PO TABS
2.5000 mg | ORAL_TABLET | Freq: Every day | ORAL | Status: AC
  Administered 2013-11-23 – 2013-11-24 (×2): 2.5 mg via ORAL
  Filled 2013-11-23 (×3): qty 1

## 2013-11-23 MED ORDER — WARFARIN SODIUM 2.5 MG PO TABS
2.5000 mg | ORAL_TABLET | Freq: Once | ORAL | Status: AC
  Administered 2013-11-23: 2.5 mg via ORAL
  Filled 2013-11-23: qty 1

## 2013-11-23 MED ORDER — POLYETHYLENE GLYCOL 3350 17 G PO PACK
17.0000 g | PACK | Freq: Every day | ORAL | Status: DC
  Administered 2013-11-23 – 2013-11-27 (×4): 17 g via ORAL
  Filled 2013-11-23 (×6): qty 1

## 2013-11-23 NOTE — Progress Notes (Addendum)
Clinical Social Work Department CLINICAL SOCIAL WORK PLACEMENT NOTE 11/23/2013  Patient:  Erin Rivers,Erin Rivers  Account Number:  [id number] Admit date:  11/17/2013  Clinical Social Worker:  Sharol HarnessPOONUM AMBELAL, Theresia MajorsLCSWA  Date/time:  11/23/2013 02:30 PM  Clinical Social Work is seeking post-discharge placement for this patient at the following level of care:   SKILLED NURSING   (*CSW will update this form in Epic as items are completed)   11/23/2013  Patient/family provided with [facility] Department of Clinical Social Work's list of facilities offering this level of care within the geographic area requested by the patient (or if unable, by the patient's family).  11/23/2013  Patient/family informed of their freedom to choose among providers that offer the needed level of care, that participate in Medicare, Medicaid or managed care program needed by the patient, have an available bed and are willing to accept the patient.  11/23/2013  Patient/family informed of [facility]' ownership interest in [facility], as well as of the fact that they are under no obligation to receive care at this facility.  PASARR submitted to EDS on 11/23/2013 PASARR number received from EDS on 11/23/2013  FL2 transmitted to all facilities in geographic area requested by pt/family on  11/23/2013 FL2 transmitted to all facilities within larger geographic area on   Patient informed that his/her managed care company has contracts with or will negotiate with  certain facilities, including the following:     Patient/family informed of bed offers received:  11/24/13 [facility]ician recommends and patient chooses bed at    [facility]   Patient to be transferred to facility by   The following physician request were entered in Epic:   Additional Comments:  Poonum Ambelal, LCSWA [phone]

## 2013-11-23 NOTE — Progress Notes (Signed)
DAILY PROGRESS NOTE  Subjective:   Got a dose of metolazone yesterday. I/O -1.2L but weight up a bit. Breathing better. Remains weak. Up to chair with PT. AF rate  ~85-110. PT recommending SNF. BMET pending.   Objective:  Temp:  [97.1 F (36.2 C)-99 F (37.2 C)] 98.6 F (37 C) (03/13 0400) Pulse Rate:  [81-130] 81 (03/13 0400) Resp:  [21-43] 31 (03/13 0400) BP: (82-124)/(55-84) 101/73 mmHg (03/13 0400) SpO2:  [93 %-100 %] 96 % (03/13 0400) Weight:  [44.6 kg (98 lb 5.2 oz)] 44.6 kg (98 lb 5.2 oz) (03/13 0400) Weight change: 0.6 kg (1 lb 5.2 oz)  Intake/Output from previous day: 03/12 0701 - 03/13 0700 In: 6 [I.V.:6] Out: 1300 [Urine:1300]  Intake/Output from this shift:    Medications: Current Facility-Administered Medications  Medication Dose Route Frequency Provider Last Rate Last Dose  . 0.9 %  sodium chloride infusion  250 mL Intravenous PRN Minus Breeding, MD      . aspirin EC tablet 81 mg  81 mg Oral Daily Minus Breeding, MD   81 mg at 11/22/13 0902  . digoxin (LANOXIN) tablet 0.0625 mg  0.0625 mg Oral Daily Minus Breeding, MD   0.0625 mg at 11/22/13 0901  . feeding supplement (ENSURE COMPLETE) (ENSURE COMPLETE) liquid 237 mL  237 mL Oral Q24H Minus Breeding, MD   237 mL at 11/22/13 1604  . furosemide (LASIX) injection 40 mg  40 mg Intravenous BID Minus Breeding, MD   40 mg at 11/22/13 1803  . levothyroxine (SYNTHROID, LEVOTHROID) tablet 75 mcg  75 mcg Oral QAC breakfast Minus Breeding, MD   75 mcg at 11/22/13 0857  . metoprolol (LOPRESSOR) tablet 50 mg  50 mg Oral BID Jolaine Artist, MD   50 mg at 11/22/13 2203  . pantoprazole (PROTONIX) EC tablet 40 mg  40 mg Oral BID Erlene Quan, PA-C   40 mg at 11/22/13 2202  . simvastatin (ZOCOR) tablet 40 mg  40 mg Oral QHS Minus Breeding, MD   40 mg at 11/22/13 2203  . sodium chloride 0.9 % injection 3 mL  3 mL Intravenous Q12H Minus Breeding, MD   3 mL at 11/22/13 2203  . sodium chloride 0.9 % injection 3 mL  3 mL  Intravenous PRN Minus Breeding, MD      . warfarin (COUMADIN) tablet 2.5 mg  2.5 mg Oral ONCE-1800 Gay Filler Carney, RPH      . Warfarin - Pharmacist Dosing Inpatient   Does not apply [id number] Minus Breeding, MD   1 each at 11/22/13 1800    Physical Exam: General appearance: frail cachectic, sitting in chair Kyphotic Neck: JVD - 9cm above sternal notch and no carotid bruit Lungs: diminished breath sounds bilaterally particularly at the bases Heart: irregularly irregular rhythm and 3/6 MR Extremities: warm trace edema Neurologic: Mental status: alert and oriented x3  Hard of hearing  Otherwise nonfocal  Lab Results: Results for orders placed during the hospital encounter of 11/17/13 (from the past 48 hour(s))  PROTIME-INR     Status: Abnormal   Collection Time    11/22/13  3:16 AM      Result Value Ref Range   Prothrombin Time 20.0 (*) 11.6 - 15.2 seconds   INR 1.76 (*) 0.00 - 2.13  BASIC METABOLIC PANEL     Status: Abnormal   Collection Time    11/22/13 11:20 AM      Result Value Ref Range   Sodium 134 (*) 137 -  147 mEq/L   Potassium 4.3  3.7 - 5.3 mEq/L   Chloride 89 (*) 96 - 112 mEq/L   CO2 29  19 - 32 mEq/L   Glucose, Bld 113 (*) 70 - 99 mg/dL   BUN 36 (*) 6 - 23 mg/dL   Creatinine, Ser 0.80  0.50 - 1.10 mg/dL   Calcium 9.4  8.4 - 10.5 mg/dL   GFR calc non Af Amer 63 (*) >90 mL/min   GFR calc Af Amer 73 (*) >90 mL/min   Comment: (NOTE)     The eGFR has been calculated using the CKD EPI equation.     This calculation has not been validated in all clinical situations.     eGFR's persistently <90 mL/min signify possible Chronic Kidney     Disease.  PROTIME-INR     Status: Abnormal   Collection Time    11/23/13  2:29 AM      Result Value Ref Range   Prothrombin Time 19.6 (*) 11.6 - 15.2 seconds   INR 1.71 (*) 0.00 - 1.49    Imaging: No results found.  Assessment:  1. A/c diastolic/valvular HF 2. Severe mitral regurgitation due to MVP 3. Chronic AF now with RVR 4.  Massive LA dialtion 5. Pulmonary HTN (RVSP 53mHG echo 11/18/13) 6. Physical deconditioning 7. Acute respiratory failure due to pulmonary edema.  Volume status still mildly elevated. Would like to see her down about 2 more pounds. Will increase lasix to 80 IV bid and continue metolazone today. Over the weekend will need to find dose of oral diuretics that will keep her weight stable will likely switch to demadex 40 bid. Use metolazone prn to protect tight weight range. Not candidate for MV clip currently.   AF rate much better. Continue lopressor and coumadin.   Continue PT. Will need SNF - potential discharge on Monday or Tuesday    We are discussing her Code Status. Full Code for now.    Length of Stay:  LOS: 6 days   DGlori BickersMD 11/23/2013, 8:12 AM

## 2013-11-23 NOTE — Progress Notes (Signed)
SATURATION QUALIFICATIONS: (This note is used to comply with regulatory documentation for home oxygen)  Patient Saturations on Room Air at Rest = 93%  Patient Saturations on Room Air while Ambulating = 86%  Patient Saturations on 2 Liters of oxygen while Ambulating = 93%  Please briefly explain why patient needs home oxygen:Pt desats on RA.  Will benefit from home O2 due to desaturation with ambulating.  Thanks. [facility] St [facility] HospitalDawn Ingold,PT Acute Rehabilitation [phone] (pager)

## 2013-11-23 NOTE — Progress Notes (Signed)
Clinical Social Work Department BRIEF PSYCHOSOCIAL ASSESSMENT 11/23/2013  Patient:  Erin Rivers,Erin Rivers     Account Number:  [id number]     Admit date:  11/17/2013  Clinical Social Worker:  Harless NakayamaAMBELAL,Zain Lankford, LCSWA  Date/Time:  11/23/2013 02:00 PM  Referred by:  Physician  Date Referred:  11/23/2013 Referred for  SNF Placement   Other Referral:   Interview type:  Patient Other interview type:   Spoke with pt and pt family at bedside    PSYCHOSOCIAL DATA Living Status:  HUSBAND Admitted from facility:  [facility] Level of care:  Independent Living Primary support name:  Karlyn AgeeRichard Tep [phone] Primary support relationship to patient:  SPOUSE Degree of support available:   Pt has very supportive family    CURRENT CONCERNS Current Concerns  Post-Acute Placement   Other Concerns:    SOCIAL WORK ASSESSMENT / PLAN CSW informed that pt will need SNF placement. CSW visited pt room and spoke with pt, pt husband and pt daughter. Pt stated early in conversation that CSW should speak with her family because they know more and she would be okay with whatever they decide. Pt family informed CSW that pt and pt husband stay at [facility] independent living but pt husband feels that she is not appropriate to return home right away and that she needs rehab. CSW explained SNF referal process. At this time, pt family has no facility preference and are agreeable to CSW sending referal to all of [place]   Assessment/plan status:  Psychosocial Support/Ongoing Assessment of Needs Other assessment/ plan:   Information/referral to community resources:   SNF list to be provided with bed offers    PATIENT'S/FAMILY'S RESPONSE TO PLAN OF CARE: Pt and pt family [facility]       Lazer Wollard, LCSWA [phone]

## 2013-11-23 NOTE — Progress Notes (Signed)
ANTICOAGULATION CONSULT NOTE - Follow Up Consult  Pharmacy Consult for Coumadin Indication: atrial fibrillation  Allergies  Allergen Reactions  . Codeine Nausea Only    Also dizziness    Patient Measurements: Height: 5\' 2"  (157.5 cm) Weight: 98 lb 5.2 oz (44.6 kg) IBW/kg (Calculated) : 50.1 Heparin Dosing Weight:   Vital Signs: Temp: 98.6 F (37 C) (03/13 0400) Temp src: Oral (03/13 0400) BP: 101/73 mmHg (03/13 0400) Pulse Rate: 81 (03/13 0400)  Labs:  Recent Labs  11/21/13 0413 11/22/13 0316 11/22/13 1120 11/23/13 0229  LABPROT 25.2* 20.0*  --  19.6*  INR 2.38* 1.76*  --  1.71*  CREATININE 0.72  --  0.80  --     Estimated Creatinine Clearance: 32.9 ml/min (by C-G formula based on Cr of 0.8).   Medications:  Scheduled:  . aspirin EC  81 mg Oral Daily  . digoxin  0.0625 mg Oral Daily  . feeding supplement (ENSURE COMPLETE)  237 mL Oral Q24H  . furosemide  40 mg Intravenous BID  . levothyroxine  75 mcg Oral QAC breakfast  . metoprolol  50 mg Oral BID  . pantoprazole  40 mg Oral BID  . simvastatin  40 mg Oral QHS  . sodium chloride  3 mL Intravenous Q12H  . Warfarin - Pharmacist Dosing Inpatient   Does not apply q1800    Assessment: 89 yof on chronic coumadin for afib. Her INR has now decreased to slightly subtherapeutic. Doses were held 3/9 and 3/10  d/t rapidly increased INR. No new CBC today and no overt bleeding noted.   PTA Coumadin dose 1.25 mg on MWFSat, and 2.5 mg TuThSun  Goal of Therapy:  INR 2-3   Plan:  1. Warfarin 2.5 mg PO x 1 tonight 2. F/u AM INR 3. Monitor closely for S&S of bleeding  Tad MooreJessica Riham Polyakov, Pharm D, BCPS  Clinical Pharmacist Pager [phone]  11/23/2013 7:14 AM

## 2013-11-23 NOTE — Progress Notes (Addendum)
Physical Therapy Treatment Patient Details Name: Derek MoundMoira G Bry MRN: [id number] DOB: [birth year] Today's Date: 11/23/2013 Time: [id number] PT Time Calculation (min): 24 min  PT Assessment / Plan / Recommendation  History of Present Illness 80 year old woman with hx of CHF and severe MR admitted with sudden onset of SOB.  Pt has been on and off bipap since admission.   PT Comments   Pt admitted with above. Pt currently with functional limitations due to the deficits listed below (see PT Problem List).  Pt will benefit from skilled PT to increase their independence and safety with mobility to allow discharge to the venue listed below.   Follow Up Recommendations  SNF;Supervision/Assistance - 24 hour (Husband unsure if he can provide care initially- short termN)     Does the patient have the potential to tolerate intense rehabilitation     Barriers to Discharge        Equipment Recommendations  3in1 (PT);Other (comment) (RW with 4 wheels and seat, may need home O2)    Recommendations for Other Services    Frequency Min 3X/week   Progress towards PT Goals Progress towards PT goals: Progressing toward goals  Plan Current plan remains appropriate    Precautions / Restrictions Precautions Precautions: Fall Precaution Comments: watch HR and 02 sats Restrictions Weight Bearing Restrictions: No   Pertinent Vitals/Pain Desat to 86% on RA with activity.  No pain.  HR better today.    Mobility  Bed Mobility Overal bed mobility: Needs Assistance Bed Mobility: Supine to Sit Supine to sit: Min assist;HOB elevated General bed mobility comments: extra time, reaches for therapist's hand Transfers Overall transfer level: Needs assistance Equipment used: Rolling walker (2 wheeled) Transfers: Sit to/from Stand Sit to Stand: +2 physical assistance;Mod assist General transfer comment: assist to power up and control descent to chair Ambulation/Gait Ambulation/Gait assistance: Min assist;+2  safety/equipment Ambulation Distance (Feet): 150 Feet Assistive device: Rolling walker (2 wheeled) Gait Pattern/deviations: Step-through pattern;Decreased step length - right;Decreased step length - left;Shuffle;Trunk flexed;Wide base of support Gait velocity interpretation: Below normal speed for age/gender General Gait Details: Pt needed assist for postural stabiity as her stability gets worse as she fatigues.  Needed less steadying assist today.  Does well with RW.  Took pt to bathroom as well.  total assist to clean pt after she urinated.      Exercises General Exercises - Lower Extremity Ankle Circles/Pumps: AROM;Both;10 reps;Supine Long Arc Quad: AROM;Both;10 reps;Seated Hip Flexion/Marching: AROM;Both;10 reps;Seated Continue to encourage incentive spirometer   PT Diagnosis:    PT Problem List:   PT Treatment Interventions:     PT Goals (current goals can now be found in the care plan section)    Visit Information  Last PT Received On: 11/23/13 Assistance Needed: +2 History of Present Illness: 80 year old woman with hx of CHF and severe MR admitted with sudden onset of SOB.  Pt has been on and off bipap since admission.    Subjective Data  Subjective: "I feel better. "   Cognition  Cognition Arousal/Alertness: Awake/alert Behavior During Therapy: WFL for tasks assessed/performed Overall Cognitive Status: Within Functional Limits for tasks assessed    Balance  Balance Overall balance assessment: Needs assistance;History of Falls Sitting-balance support: Bilateral upper extremity supported;Feet supported Sitting balance-Leahy Scale: Good Postural control: Posterior lean Standing balance support: Bilateral upper extremity supported;During functional activity Standing balance-Leahy Scale: Poor Standing balance comment: Needs UE support for stability. Cannot tolerate challenges.   End of Session PT - End  of Session Equipment Utilized During Treatment: Gait  belt;Oxygen Activity Tolerance: Patient limited by fatigue Patient left: in chair;with call bell/phone within reach;with family/visitor present Nurse Communication: Mobility status   GP     INGOLD,Lavender Stanke 11/23/2013, 11:56 AM  [facility] [phone] (pager)

## 2013-11-24 LAB — PROTIME-INR
INR: 2.28 — ABNORMAL HIGH (ref 0.00–1.49)
Prothrombin Time: 24.4 seconds — ABNORMAL HIGH (ref 11.6–15.2)

## 2013-11-24 MED ORDER — TORSEMIDE 20 MG PO TABS
40.0000 mg | ORAL_TABLET | Freq: Two times a day (BID) | ORAL | Status: DC
  Administered 2013-11-24 – 2013-11-25 (×2): 40 mg via ORAL
  Filled 2013-11-24 (×4): qty 2

## 2013-11-24 MED ORDER — WARFARIN 1.25 MG HALF TABLET
1.2500 mg | ORAL_TABLET | Freq: Once | ORAL | Status: AC
  Administered 2013-11-24: 1.25 mg via ORAL
  Filled 2013-11-24: qty 1

## 2013-11-24 MED ORDER — POTASSIUM CHLORIDE CRYS ER 20 MEQ PO TBCR
40.0000 meq | EXTENDED_RELEASE_TABLET | Freq: Once | ORAL | Status: AC
  Administered 2013-11-24: 40 meq via ORAL
  Filled 2013-11-24: qty 2

## 2013-11-24 NOTE — Progress Notes (Signed)
ANTICOAGULATION CONSULT NOTE - Follow Up Consult  Pharmacy Consult for Coumadin Indication: atrial fibrillation  Allergies  Allergen Reactions  . Codeine Nausea Only    Also dizziness    Patient Measurements: Height: 5\' 2"  (157.5 cm) Weight: 92 lb 9.5 oz (42 kg) IBW/kg (Calculated) : 50.1   Vital Signs: Temp: 98.2 F (36.8 C) (03/14 1240) Temp src: Oral (03/14 1240) BP: 99/64 mmHg (03/14 1240) Pulse Rate: 116 (03/14 1240)  Labs:  Recent Labs  11/22/13 0316 11/22/13 1120 11/23/13 0229 11/23/13 0835 11/24/13 0330  LABPROT 20.0*  --  19.6*  --  24.4*  INR 1.76*  --  1.71*  --  2.28*  CREATININE  --  0.80  --  0.89  --     Estimated Creatinine Clearance: 27.9 ml/min (by C-G formula based on Cr of 0.89).   Medications:  Scheduled:  . aspirin EC  81 mg Oral Daily  . digoxin  0.0625 mg Oral Daily  . feeding supplement (ENSURE COMPLETE)  237 mL Oral Q24H  . levothyroxine  75 mcg Oral QAC breakfast  . metoprolol  50 mg Oral BID  . pantoprazole  40 mg Oral BID  . polyethylene glycol  17 g Oral Daily  . simvastatin  40 mg Oral QHS  . sodium chloride  3 mL Intravenous Q12H  . torsemide  40 mg Oral BID  . warfarin  1.25 mg Oral ONCE-1800  . Warfarin - Pharmacist Dosing Inpatient   Does not apply q1800    Assessment: 89 yof on chronic coumadin for afib. A couple of doses were skipped due to elevated INR, but INR is therapeutic today after increasing from 1.71 to 2.28. No new CBC. No s/s bleeding noted.  PTA Coumadin dose 1.25 mg on MWFSat, and 2.5 mg TuThSun  Goal of Therapy:  INR 2-3   Plan:  - Warfarin 1.25mg  PO x 1 tonight - Monitor daily CBC, INR, and S/S bleeding  Khamani Daniely A. Lenon AhmadiBinz, PharmD Clinical Pharmacist - Resident Pager: [phone] Pharmacy: [phone] 11/24/2013 2:37 PM

## 2013-11-24 NOTE — Progress Notes (Signed)
DAILY PROGRESS NOTE  Subjective:   Lasix increased yesterday. Weight down 5 pounds. Breathing better. Remains weak. Up to chair with PT. AF rate  ~85-95. PT recommending SNF. Renal function stable. Did not use bipap last night.  Objective:  Temp:  [97.8 F (36.6 C)-98.5 F (36.9 C)] 98.3 F (36.8 C) (03/14 0800) Pulse Rate:  [79-132] 99 (03/14 0800) Resp:  [15-34] 25 (03/14 0800) BP: (102-111)/(60-77) 104/71 mmHg (03/14 0800) SpO2:  [95 %-100 %] 100 % (03/14 0800) Weight:  [42 kg (92 lb 9.5 oz)-42.2 kg (93 lb 0.6 oz)] 42 kg (92 lb 9.5 oz) (03/14 0808) Weight change: -2.4 kg (-5 lb 4.7 oz)  Intake/Output from previous day: 03/13 0701 - 03/14 0700 In: 533 [P.O.:400; I.V.:133] Out: 2250 [Urine:2250]  Intake/Output from this shift: Total I/O In: 240 [P.O.:240] Out: -   Medications: Current Facility-Administered Medications  Medication Dose Route Frequency Provider Last Rate Last Dose  . 0.9 %  sodium chloride infusion  250 mL Intravenous PRN Minus Breeding, MD      . aspirin EC tablet 81 mg  81 mg Oral Daily Minus Breeding, MD   81 mg at 11/23/13 0906  . digoxin (LANOXIN) tablet 0.0625 mg  0.0625 mg Oral Daily Minus Breeding, MD   0.0625 mg at 11/23/13 0910  . feeding supplement (ENSURE COMPLETE) (ENSURE COMPLETE) liquid 237 mL  237 mL Oral Q24H Minus Breeding, MD   237 mL at 11/23/13 1634  . furosemide (LASIX) injection 80 mg  80 mg Intravenous BID Jolaine Artist, MD   80 mg at 11/24/13 0923  . levothyroxine (SYNTHROID, LEVOTHROID) tablet 75 mcg  75 mcg Oral QAC breakfast Minus Breeding, MD   75 mcg at 11/24/13 0724  . metolazone (ZAROXOLYN) tablet 2.5 mg  2.5 mg Oral Daily Jolaine Artist, MD   2.5 mg at 11/23/13 1120  . metoprolol (LOPRESSOR) tablet 50 mg  50 mg Oral BID Jolaine Artist, MD   50 mg at 11/23/13 2146  . pantoprazole (PROTONIX) EC tablet 40 mg  40 mg Oral BID Erlene Quan, PA-C   40 mg at 11/23/13 2146  . polyethylene glycol (MIRALAX / GLYCOLAX)  packet 17 g  17 g Oral Daily Jolaine Artist, MD   17 g at 11/23/13 1121  . simvastatin (ZOCOR) tablet 40 mg  40 mg Oral QHS Minus Breeding, MD   40 mg at 11/23/13 2145  . sodium chloride 0.9 % injection 3 mL  3 mL Intravenous Q12H Minus Breeding, MD   3 mL at 11/23/13 2146  . sodium chloride 0.9 % injection 3 mL  3 mL Intravenous PRN Minus Breeding, MD      . Warfarin - Pharmacist Dosing Inpatient   Does not apply q1800 Minus Breeding, MD        Physical Exam: General appearance: frail cachectic, sitting in bed Kyphotic Neck: JVD - 6-7cm Lungs: diminished breath sounds bilaterally particularly at the bases Heart: irregularly irregular rhythm and 3/6 MR Extremities: warm no edema Neurologic: Mental status: alert and oriented x3  Hard of hearing  Otherwise nonfocal  Lab Results: Results for orders placed during the hospital encounter of 11/17/13 (from the past 48 hour(s))  BASIC METABOLIC PANEL     Status: Abnormal   Collection Time    11/22/13 11:20 AM      Result Value Ref Range   Sodium 134 (*) 137 - 147 mEq/L   Potassium 4.3  3.7 - 5.3 mEq/L  Chloride 89 (*) 96 - 112 mEq/L   CO2 29  19 - 32 mEq/L   Glucose, Bld 113 (*) 70 - 99 mg/dL   BUN 36 (*) 6 - 23 mg/dL   Creatinine, Ser 0.80  0.50 - 1.10 mg/dL   Calcium 9.4  8.4 - 10.5 mg/dL   GFR calc non Af Amer 63 (*) >90 mL/min   GFR calc Af Amer 73 (*) >90 mL/min   Comment: (NOTE)     The eGFR has been calculated using the CKD EPI equation.     This calculation has not been validated in all clinical situations.     eGFR's persistently <90 mL/min signify possible Chronic Kidney     Disease.  PROTIME-INR     Status: Abnormal   Collection Time    11/23/13  2:29 AM      Result Value Ref Range   Prothrombin Time 19.6 (*) 11.6 - 15.2 seconds   INR 1.71 (*) 0.00 - 2.76  BASIC METABOLIC PANEL     Status: Abnormal   Collection Time    11/23/13  8:35 AM      Result Value Ref Range   Sodium 137  137 - 147 mEq/L   Potassium 3.5  (*) 3.7 - 5.3 mEq/L   Chloride 91 (*) 96 - 112 mEq/L   CO2 32  19 - 32 mEq/L   Glucose, Bld 129 (*) 70 - 99 mg/dL   BUN 37 (*) 6 - 23 mg/dL   Creatinine, Ser 0.89  0.50 - 1.10 mg/dL   Calcium 9.7  8.4 - 10.5 mg/dL   GFR calc non Af Amer 55 (*) >90 mL/min   GFR calc Af Amer 64 (*) >90 mL/min   Comment: (NOTE)     The eGFR has been calculated using the CKD EPI equation.     This calculation has not been validated in all clinical situations.     eGFR's persistently <90 mL/min signify possible Chronic Kidney     Disease.  PROTIME-INR     Status: Abnormal   Collection Time    11/24/13  3:30 AM      Result Value Ref Range   Prothrombin Time 24.4 (*) 11.6 - 15.2 seconds   INR 2.28 (*) 0.00 - 1.49    Imaging: No results found.  Assessment:  1. A/c diastolic/valvular HF 2. Severe mitral regurgitation due to MVP 3. Chronic AF now with RVR 4. Massive LA dialtion 5. Pulmonary HTN (RVSP 51mHG echo 11/18/13) 6. Physical deconditioning 7. Acute respiratory failure due to pulmonary edema. 8. Hypokalemia  Her volume status is about as good as we are going to get it. Weight down to 92-93 pounds. Will switch to oral demadex and attempt to protect weight of 92-95 pounds with demadex and metolazone as needed. Supp K+.  AF rate much better. Continue lopressor and coumadin.   Continue PT. Her tele and other wires are limiting ambulation. Will d/c tele. Will need SNF - potential discharge on Monday or Tuesday   We are discussing her Code Status. Full Code for now.    Length of Stay:  LOS: 7 days   DGlori BickersMD 11/24/2013, 8:54 AM

## 2013-11-25 LAB — BASIC METABOLIC PANEL
BUN: 44 mg/dL — AB (ref 6–23)
CHLORIDE: 80 meq/L — AB (ref 96–112)
CO2: 35 meq/L — AB (ref 19–32)
CREATININE: 1.12 mg/dL — AB (ref 0.50–1.10)
Calcium: 10.4 mg/dL (ref 8.4–10.5)
GFR calc Af Amer: 49 mL/min — ABNORMAL LOW (ref >90)
GFR calc non Af Amer: 42 mL/min — ABNORMAL LOW (ref >90)
Glucose, Bld: 167 mg/dL — ABNORMAL HIGH (ref 70–99)
Potassium: 3 mEq/L — ABNORMAL LOW (ref 3.7–5.3)
Sodium: 131 mEq/L — ABNORMAL LOW (ref 137–147)

## 2013-11-25 LAB — PROTIME-INR
INR: 3.02 — AB (ref 0.00–1.49)
Prothrombin Time: 30.2 seconds — ABNORMAL HIGH (ref 11.6–15.2)

## 2013-11-25 MED ORDER — TORSEMIDE 20 MG PO TABS
20.0000 mg | ORAL_TABLET | Freq: Two times a day (BID) | ORAL | Status: DC
  Administered 2013-11-26 – 2013-11-27 (×3): 20 mg via ORAL
  Filled 2013-11-25 (×5): qty 1

## 2013-11-25 NOTE — Discharge Instructions (Signed)
Information on my medicine - Coumadin®   (Warfarin) ° °This medication education was reviewed with me or my healthcare representative as part of my discharge preparation.  The pharmacist that spoke with me during my hospital stay was:  Carlye Panameno A, RPH ° °Why was Coumadin prescribed for you? °Coumadin was prescribed for you because you have a blood clot or a medical condition that can cause an increased risk of forming blood clots. Blood clots can cause serious health problems by blocking the flow of blood to the heart, lung, or brain. Coumadin can prevent harmful blood clots from forming. °As a reminder your indication for Coumadin is:   Stroke Prevention Because Of Atrial Fibrillation ° °What test will check on my response to Coumadin? °While on Coumadin (warfarin) you will need to have an INR test regularly to ensure that your dose is keeping you in the desired range. The INR (international normalized ratio) number is calculated from the result of the laboratory test called prothrombin time (PT). ° °If an INR APPOINTMENT HAS NOT ALREADY BEEN MADE FOR YOU please schedule an appointment to have this lab work done by your health care provider within 7 days. °Your INR goal is usually a number between:  2 to 3 or your provider may give you a more narrow range like 2-2.5.  Ask your health care provider during an office visit what your goal INR is. ° °What  do you need to  know  About  COUMADIN? °Take Coumadin (warfarin) exactly as prescribed by your healthcare provider about the same time each day.  DO NOT stop taking without talking to the doctor who prescribed the medication.  Stopping without other blood clot prevention medication to take the place of Coumadin may increase your risk of developing a new clot or stroke.  Get refills before you run out. ° °What do you do if you miss a dose? °If you miss a dose, take it as soon as you remember on the same day then continue your regularly scheduled regimen the next  day.  Do not take two doses of Coumadin at the same time. ° °Important Safety Information °A possible side effect of Coumadin (Warfarin) is an increased risk of bleeding. You should call your healthcare provider right away if you experience any of the following: °  Bleeding from an injury or your nose that does not stop. °  Unusual colored urine (red or dark brown) or unusual colored stools (red or black). °  Unusual bruising for unknown reasons. °  A serious fall or if you hit your head (even if there is no bleeding). ° °Some foods or medicines interact with Coumadin® (warfarin) and might alter your response to warfarin. To help avoid this: °  Eat a balanced diet, maintaining a consistent amount of Vitamin K. °  Notify your provider about major diet changes you plan to make. °  Avoid alcohol or limit your intake to 1 drink for women and 2 drinks for men per day. °(1 drink is 5 oz. wine, 12 oz. beer, or 1.5 oz. liquor.) ° °Make sure that ANY health care provider who prescribes medication for you knows that you are taking Coumadin (warfarin).  Also make sure the healthcare provider who is monitoring your Coumadin knows when you have started a new medication including herbals and non-prescription products. ° °Coumadin® (Warfarin)  Major Drug Interactions  °Increased Warfarin Effect Decreased Warfarin Effect  °Alcohol (large quantities) °Antibiotics (esp. Septra/Bactrim, Flagyl, Cipro) °Amiodarone (Cordarone) °Aspirin (  ASA) °Cimetidine (Tagamet) °Megestrol (Megace) °NSAIDs (ibuprofen, naproxen, etc.) °Piroxicam (Feldene) °Propafenone (Rythmol SR) °Propranolol (Inderal) °Isoniazid (INH) °Posaconazole (Noxafil) Barbiturates (Phenobarbital) °Carbamazepine (Tegretol) °Chlordiazepoxide (Librium) °Cholestyramine (Questran) °Griseofulvin °Oral Contraceptives °Rifampin °Sucralfate (Carafate) °Vitamin K  ° °Coumadin® (Warfarin) Major Herbal Interactions  °Increased Warfarin Effect Decreased Warfarin Effect   °Garlic °Ginseng °Ginkgo biloba Coenzyme Q10 °Green tea °St. John’s wort   ° °Coumadin® (Warfarin) FOOD Interactions  °Eat a consistent number of servings per week of foods HIGH in Vitamin K °(1 serving = ½ cup)  °Collards (cooked, or boiled & drained) °Kale (cooked, or boiled & drained) °Mustard greens (cooked, or boiled & drained) °Parsley *serving size only = ¼ cup °Spinach (cooked, or boiled & drained) °Swiss chard (cooked, or boiled & drained) °Turnip greens (cooked, or boiled & drained)  °Eat a consistent number of servings per week of foods MEDIUM-HIGH in Vitamin K °(1 serving = 1 cup)  °Asparagus (cooked, or boiled & drained) °Broccoli (cooked, boiled & drained, or raw & chopped) °Brussel sprouts (cooked, or boiled & drained) *serving size only = ½ cup °Lettuce, raw (green leaf, endive, romaine) °Spinach, raw °Turnip greens, raw & chopped  ° °These websites have more information on Coumadin (warfarin):  www.coumadin.com; °www.ahrq.gov/consumer/coumadin.htm; ° ° °

## 2013-11-25 NOTE — Progress Notes (Signed)
DAILY PROGRESS NOTE  Subjective:   Switched to po demadex yesterday and weight now down to 88 pounds. Breathing better. Continues to feel weak. SBP (90s) and HR stable. No BMET for 2 days.  Objective:  Temp:  [97.2 F (36.2 C)-98.3 F (36.8 C)] 97.2 F (36.2 C) (03/15 0835) Pulse Rate:  [75-116] 93 (03/15 1009) Resp:  [21-31] 26 (03/15 1009) BP: (95-101)/(50-74) 98/73 mmHg (03/15 1009) SpO2:  [96 %-97 %] 97 % (03/15 1009) Weight:  [40.1 kg (88 lb 6.5 oz)] 40.1 kg (88 lb 6.5 oz) (03/15 0700) Weight change: -0.2 kg (-7.1 oz)  Intake/Output from previous day: 03/14 0701 - 03/15 0700 In: 843 [P.O.:840; I.V.:3] Out: 1727 [Urine:1725; Stool:2]  Intake/Output from this shift: Total I/O In: 120 [P.O.:120] Out: 150 [Urine:150]  Medications: Current Facility-Administered Medications  Medication Dose Route Frequency Provider Last Rate Last Dose  . 0.9 %  sodium chloride infusion  250 mL Intravenous PRN Rollene RotundaJames Hochrein, MD      . aspirin EC tablet 81 mg  81 mg Oral Daily Rollene RotundaJames Hochrein, MD   81 mg at 11/25/13 1009  . digoxin (LANOXIN) tablet 0.0625 mg  0.0625 mg Oral Daily Rollene RotundaJames Hochrein, MD   0.0625 mg at 11/25/13 1009  . feeding supplement (ENSURE COMPLETE) (ENSURE COMPLETE) liquid 237 mL  237 mL Oral Q24H Rollene RotundaJames Hochrein, MD   237 mL at 11/24/13 1506  . levothyroxine (SYNTHROID, LEVOTHROID) tablet 75 mcg  75 mcg Oral QAC breakfast Rollene RotundaJames Hochrein, MD   75 mcg at 11/25/13 [phone]  . metoprolol (LOPRESSOR) tablet 50 mg  50 mg Oral BID Dolores Pattyaniel R Carney Saxton, MD   50 mg at 11/25/13 1011  . pantoprazole (PROTONIX) EC tablet 40 mg  40 mg Oral BID Eda PaschalLuke K Kilroy, PA-C   40 mg at 11/25/13 1013  . polyethylene glycol (MIRALAX / GLYCOLAX) packet 17 g  17 g Oral Daily Dolores Pattyaniel R Nelta Caudill, MD   17 g at 11/24/13 0913  . simvastatin (ZOCOR) tablet 40 mg  40 mg Oral QHS Rollene RotundaJames Hochrein, MD   40 mg at 11/24/13 2144  . sodium chloride 0.9 % injection 3 mL  3 mL Intravenous Q12H Rollene RotundaJames Hochrein, MD   3 mL at  11/25/13 1012  . sodium chloride 0.9 % injection 3 mL  3 mL Intravenous PRN Rollene RotundaJames Hochrein, MD      . torsemide [facility](DEMADEX) tablet 40 mg  40 mg Oral BID Dolores Pattyaniel R Nikoli Nasser, MD   40 mg at 11/25/13 0813  . Warfarin - Pharmacist Dosing Inpatient   Does not apply q1800 Rollene RotundaJames Hochrein, MD        Physical Exam: General appearance: frail cachectic, sitting in bed Kyphotic Neck: JVD - 5-6cm Lungs: clear. Mildly diminished aat bases Heart: irregularly irregular rhythm and 3/6 MR Extremities: warm no edema Neurologic: Mental status: alert and oriented x3  Hard of hearing  Otherwise nonfocal  Lab Results: Results for orders placed during the hospital encounter of 11/17/13 (from the past 48 hour(s))  PROTIME-INR     Status: Abnormal   Collection Time    11/24/13  3:30 AM      Result Value Ref Range   Prothrombin Time 24.4 (*) 11.6 - 15.2 seconds   INR 2.28 (*) 0.00 - 1.49  PROTIME-INR     Status: Abnormal   Collection Time    11/25/13  3:40 AM      Result Value Ref Range   Prothrombin Time 30.2 (*) 11.6 - 15.2 seconds  INR 3.02 (*) 0.00 - 1.49    Imaging: No results found.  Assessment:  1. A/c diastolic/valvular HF 2. Severe mitral regurgitation due to MVP 3. Chronic AF now with RVR 4. Massive LA dialtion 5. Pulmonary HTN (RVSP echo 11/18/13) 6. Physical deconditioning 7. Acute respiratory failure due to pulmonary edema. 8. Hypokalemia  Her volume status is about as good as we are going to get it. Weight down to 88 pounds. Will hold demadex tonight and cut dose to 20 bid. Attempt to protect weight of 88-90 pounds with demadex and metolazone as needed. Needs BMET today.   AF rate much better. Continue lopressor and coumadin.   Continue PT. Her tele and other wires are limiting ambulation. Tele d/c'd. Will need SNF - potential discharge on Tuesday.   We are discussing her Code Status. Full Code for now.    Length of Stay:  LOS: 8 days   Erin Meres MD 11/25/2013,  11:55 AM

## 2013-11-25 NOTE — Progress Notes (Signed)
ANTICOAGULATION CONSULT NOTE - Follow Up Consult  Pharmacy Consult for Coumadin Indication: atrial fibrillation  Allergies  Allergen Reactions  . Codeine Nausea Only    Also dizziness    Patient Measurements: Height: 5\' 2"  (157.5 cm) Weight: 88 lb 6.5 oz (40.1 kg) IBW/kg (Calculated) : 50.1   Vital Signs: Temp: 97.2 F (36.2 C) (03/15 0835) Temp src: Oral (03/15 0835) BP: 96/74 mmHg (03/15 0835)  Labs:  Recent Labs  11/22/13 1120 11/23/13 0229 11/23/13 0835 11/24/13 0330 11/25/13 0340  LABPROT  --  19.6*  --  24.4* 30.2*  INR  --  1.71*  --  2.28* 3.02*  CREATININE 0.80  --  0.89  --   --     Estimated Creatinine Clearance: 26.6 ml/min (by C-G formula based on Cr of 0.89).   Medications:  Scheduled:  . aspirin EC  81 mg Oral Daily  . digoxin  0.0625 mg Oral Daily  . feeding supplement (ENSURE COMPLETE)  237 mL Oral Q24H  . levothyroxine  75 mcg Oral QAC breakfast  . metoprolol  50 mg Oral BID  . pantoprazole  40 mg Oral BID  . polyethylene glycol  17 g Oral Daily  . simvastatin  40 mg Oral QHS  . sodium chloride  3 mL Intravenous Q12H  . torsemide  40 mg Oral BID  . Warfarin - Pharmacist Dosing Inpatient   Does not apply q1800    Assessment: 80 yo F on chronic coumadin for afib. The patient's INR increased significantly from 2.28 to 3.02. No new CBC since 3/10. No s/s bleeding noted.  PTA Coumadin dose 1.25 mg on MWFSat, and 2.5 mg TuThSun  Goal of Therapy:  INR 2-3   Plan:  - Hold coumadin today - Monitor daily CBC, INR, and S/S bleeding  Nneoma Harral A. Lenon AhmadiBinz, PharmD Clinical Pharmacist - Resident Pager: [phone] Pharmacy: [phone] 11/25/2013 8:50 AM

## 2013-11-26 LAB — BASIC METABOLIC PANEL
BUN: 45 mg/dL — AB (ref 6–23)
CHLORIDE: 82 meq/L — AB (ref 96–112)
CO2: 35 mEq/L — ABNORMAL HIGH (ref 19–32)
Calcium: 10.3 mg/dL (ref 8.4–10.5)
Creatinine, Ser: 1.06 mg/dL (ref 0.50–1.10)
GFR calc Af Amer: 52 mL/min — ABNORMAL LOW (ref >90)
GFR calc non Af Amer: 45 mL/min — ABNORMAL LOW (ref >90)
Glucose, Bld: 98 mg/dL (ref 70–99)
Potassium: 3.4 mEq/L — ABNORMAL LOW (ref 3.7–5.3)
Sodium: 133 mEq/L — ABNORMAL LOW (ref 137–147)

## 2013-11-26 LAB — CBC
HEMATOCRIT: 30.8 % — AB (ref 36.0–46.0)
HEMOGLOBIN: 9.1 g/dL — AB (ref 12.0–15.0)
MCH: 22.8 pg — ABNORMAL LOW (ref 26.0–34.0)
MCHC: 29.5 g/dL — AB (ref 30.0–36.0)
MCV: 77.2 fL — ABNORMAL LOW (ref 78.0–100.0)
Platelets: 389 10*3/uL (ref 150–400)
RBC: 3.99 MIL/uL (ref 3.87–5.11)
RDW: 19.4 % — ABNORMAL HIGH (ref 11.5–15.5)
WBC: 11.1 10*3/uL — ABNORMAL HIGH (ref 4.0–10.5)

## 2013-11-26 LAB — PROTIME-INR
INR: 2.89 — ABNORMAL HIGH (ref 0.00–1.49)
PROTHROMBIN TIME: 29.2 s — AB (ref 11.6–15.2)

## 2013-11-26 MED ORDER — POTASSIUM CHLORIDE CRYS ER 20 MEQ PO TBCR
40.0000 meq | EXTENDED_RELEASE_TABLET | Freq: Once | ORAL | Status: AC
  Administered 2013-11-26: 40 meq via ORAL
  Filled 2013-11-26: qty 2

## 2013-11-26 MED ORDER — WARFARIN 1.25 MG HALF TABLET
1.2500 mg | ORAL_TABLET | Freq: Once | ORAL | Status: AC
  Administered 2013-11-26: 1.25 mg via ORAL
  Filled 2013-11-26: qty 1

## 2013-11-26 NOTE — Progress Notes (Signed)
I printed the list of bed offers and went to pt's room to speak with pt about placement. Pt was sitting up in bed and husband was at bedside when I came to room. I asked how pt is feeling today, and she stated that she "didn't know" and was eager to speak with MD about disposition. Pt states she has not gotten must sleep since being in the hospital, and that it seems as soon as she falls asleep someone is coming to draw blood or check her vitals. I provided support to pt, and MD came to pt's room while I was sitting with pt and her husband. MD explained pt is transferring to telemetry floor today and that discharge is expected for tomorrow. Pt happy to hear this, and MD and CSW talked about placement options with pt and her husband. Husband explained he has spoken with some friends/acquaintances about bed options, and [facility] I called [name] and left voicemail for administrator Jasmine DecemberSharon, and Jasmine DecemberSharon texted back stating she got my message and asking if husband could come do admissions paperwork today. I provided Sharon's phone number to pt's husband and texted Jasmine DecemberSharon back letting her know I have done this and husband will contact Jasmine DecemberSharon to complete paperwork. [facility]2 is on pt's chart, and I will provide CSW for the telemetry unit pt transfers to with a handoff informing him/her that [facility] and discharge is tentatively set for tomorrow. I also explained that a non-emergency ambulance [facility] if husband would prefer this over driving her to SNF. Husband will consider this and speak with CSW on unit about discharge vehicle choice.   Erin Rivers, MSW, [facility] Clinical Social Worker [phone]

## 2013-11-26 NOTE — Progress Notes (Signed)
Pt.arrived to the unit around 1730 transported by wheelchair with oxygen at 2 L [place]. She is A/Ox4 and accompanied by her husband and the NT. She has had no c/o pain and no sign of distress. Pt.is hearing impaired.

## 2013-11-26 NOTE — Progress Notes (Signed)
DAILY PROGRESS NOTE  Subjective:   Feels much better today. Weight stable at 88 pounds. No orthopnea or PND. On po demadex.   Objective:  Temp:  [97.7 F (36.5 C)-97.9 F (36.6 C)] 97.8 F (36.6 C) (03/16 0913) Pulse Rate:  [76-88] 76 (03/16 0955) BP: (86-112)/(46-68) 105/48 mmHg (03/16 0955) SpO2:  [88 %-98 %] 96 % (03/16 0028) Weight:  [88 lb 6.5 oz (40.1 kg)] 88 lb 6.5 oz (40.1 kg) (03/16 0403) Weight change: -4 lb 3 oz (-1.9 kg)  Intake/Output from previous day: 03/15 0701 - 03/16 0700 In: 513 [P.O.:510; I.V.:3] Out: 800 [Urine:800]  Intake/Output from this shift: Total I/O In: -  Out: 100 [Urine:100]  Medications: Current Facility-Administered Medications  Medication Dose Route Frequency Provider Last Rate Last Dose  . 0.9 %  sodium chloride infusion  250 mL Intravenous PRN Minus Breeding, MD      . aspirin EC tablet 81 mg  81 mg Oral Daily Minus Breeding, MD   81 mg at 11/26/13 0955  . digoxin (LANOXIN) tablet 0.0625 mg  0.0625 mg Oral Daily Minus Breeding, MD   0.0625 mg at 11/26/13 0956  . feeding supplement (ENSURE COMPLETE) (ENSURE COMPLETE) liquid 237 mL  237 mL Oral Q24H Minus Breeding, MD   237 mL at 11/25/13 1500  . levothyroxine (SYNTHROID, LEVOTHROID) tablet 75 mcg  75 mcg Oral QAC breakfast Minus Breeding, MD   75 mcg at 11/26/13 0605  . metoprolol (LOPRESSOR) tablet 50 mg  50 mg Oral BID Jolaine Artist, MD   50 mg at 11/26/13 0955  . pantoprazole (PROTONIX) EC tablet 40 mg  40 mg Oral BID Erlene Quan, PA-C   40 mg at 11/25/13 2110  . polyethylene glycol (MIRALAX / GLYCOLAX) packet 17 g  17 g Oral Daily Jolaine Artist, MD   17 g at 11/26/13 0957  . potassium chloride SA (K-DUR,KLOR-CON) CR tablet 40 mEq  40 mEq Oral Once Jolaine Artist, MD      . simvastatin (ZOCOR) tablet 40 mg  40 mg Oral QHS Minus Breeding, MD   40 mg at 11/25/13 2110  . sodium chloride 0.9 % injection 3 mL  3 mL Intravenous Q12H Minus Breeding, MD   3 mL at 11/26/13  0956  . sodium chloride 0.9 % injection 3 mL  3 mL Intravenous PRN Minus Breeding, MD      . torsemide [facility]) tablet 20 mg  20 mg Oral BID Jolaine Artist, MD   20 mg at 11/26/13 0900  . warfarin (COUMADIN) tablet 1.25 mg  1.25 mg Oral ONCE-1800 Gay Filler Carney, RPH      . Warfarin - Pharmacist Dosing Inpatient   Does not apply q1800 Minus Breeding, MD        Physical Exam: General appearance: frail cachectic, sitting in bed Kyphotic Neck: JVD - 5-6cm Lungs: clear. Mildly diminished aat bases Heart: irregularly irregular rhythm and 3/6 MR Extremities: warm no edema Neurologic: Mental status: alert and oriented x3  Hard of hearing  Otherwise nonfocal  Lab Results: Results for orders placed during the hospital encounter of 11/17/13 (from the past 48 hour(s))  PROTIME-INR     Status: Abnormal   Collection Time    11/25/13  3:40 AM      Result Value Ref Range   Prothrombin Time 30.2 (*) 11.6 - 15.2 seconds   INR 3.02 (*) 0.00 - 6.94  BASIC METABOLIC PANEL     Status: Abnormal  Collection Time    11/25/13  3:40 PM      Result Value Ref Range   Sodium 131 (*) 137 - 147 mEq/L   Potassium 3.0 (*) 3.7 - 5.3 mEq/L   Chloride 80 (*) 96 - 112 mEq/L   CO2 35 (*) 19 - 32 mEq/L   Glucose, Bld 167 (*) 70 - 99 mg/dL   BUN 44 (*) 6 - 23 mg/dL   Creatinine, Ser 1.12 (*) 0.50 - 1.10 mg/dL   Calcium 10.4  8.4 - 10.5 mg/dL   GFR calc non Af Amer 42 (*) >90 mL/min   GFR calc Af Amer 49 (*) >90 mL/min   Comment: (NOTE)     The eGFR has been calculated using the CKD EPI equation.     This calculation has not been validated in all clinical situations.     eGFR's persistently <90 mL/min signify possible Chronic Kidney     Disease.  PROTIME-INR     Status: Abnormal   Collection Time    11/26/13  2:57 AM      Result Value Ref Range   Prothrombin Time 29.2 (*) 11.6 - 15.2 seconds   INR 2.89 (*) 0.00 - 2.33  BASIC METABOLIC PANEL     Status: Abnormal   Collection Time    11/26/13  2:57 AM       Result Value Ref Range   Sodium 133 (*) 137 - 147 mEq/L   Potassium 3.4 (*) 3.7 - 5.3 mEq/L   Chloride 82 (*) 96 - 112 mEq/L   CO2 35 (*) 19 - 32 mEq/L   Glucose, Bld 98  70 - 99 mg/dL   BUN 45 (*) 6 - 23 mg/dL   Creatinine, Ser 1.06  0.50 - 1.10 mg/dL   Calcium 10.3  8.4 - 10.5 mg/dL   GFR calc non Af Amer 45 (*) >90 mL/min   GFR calc Af Amer 52 (*) >90 mL/min   Comment: (NOTE)     The eGFR has been calculated using the CKD EPI equation.     This calculation has not been validated in all clinical situations.     eGFR's persistently <90 mL/min signify possible Chronic Kidney     Disease.  CBC     Status: Abnormal   Collection Time    11/26/13  2:57 AM      Result Value Ref Range   WBC 11.1 (*) 4.0 - 10.5 K/uL   RBC 3.99  3.87 - 5.11 MIL/uL   Hemoglobin 9.1 (*) 12.0 - 15.0 g/dL   HCT 30.8 (*) 36.0 - 46.0 %   MCV 77.2 (*) 78.0 - 100.0 fL   MCH 22.8 (*) 26.0 - 34.0 pg   MCHC 29.5 (*) 30.0 - 36.0 g/dL   RDW 19.4 (*) 11.5 - 15.5 %   Platelets 389  150 - 400 K/uL    Imaging: No results found.  Assessment:  1. A/c diastolic/valvular HF 2. Severe mitral regurgitation due to MVP 3. Chronic AF now with RVR 4. Massive LA dialtion 5. Pulmonary HTN (RVSP 68mHG echo 11/18/13) 6. Physical deconditioning 7. Acute respiratory failure due to pulmonary edema. 8. Hypokalemia  Her volume status is about as good as we are going to get it. Weight down to 88 pounds and stable on po demadex 20 bid. K+ is low and we will supp. Will continue demadex dosing with metolazone 2.5 mg prn for weight 92 pounds or greater.   AF rate much better. Continue lopressor  and coumadin.   Continue PT. Likely D[facility].  We are discussing her Code Status. Full Code for now.   Transfer to tele.    Length of Stay:  LOS: 9 days   Glori Bickers MD 11/26/2013, 2:44 PM

## 2013-11-26 NOTE — Progress Notes (Signed)
Pt BP 98/60 manual this PM, HR 70-90. Dr. Terressa KoyanagiBalfour notified and ordered to give metoprolol d/t pt is in A fib and BP has been 90-110 this admission. Will continue to monitor. Huel Coventryosenberger, Breyer Tejera A, RN

## 2013-11-26 NOTE — Progress Notes (Signed)
Report given to receiving nurse(3E).

## 2013-11-26 NOTE — Progress Notes (Addendum)
Physical Therapy Treatment Patient Details Name: Erin Rivers MRN: [id number] DOB: 10/11/1920 Today's Date: 11/26/2013 Time: [id number] PT Time Calculation (min): 24 min  PT Assessment / Plan / Recommendation  History of Present Illness 80 year old woman with hx of CHF and severe MR admitted with sudden onset of SOB.  Pt has been on and off bipap since admission.   PT Comments   Pt admitted with above. Pt currently with functional limitations due to  Balance and endurance deficits.  Pt will benefit from skilled PT to increase their independence and safety with mobility to allow discharge to the venue listed below.    Follow Up Recommendations  SNF;Supervision/Assistance - 24 hour     Does the patient have the potential to tolerate intense rehabilitation     Barriers to Discharge        Equipment Recommendations  3in1 (PT) (RW with seat)    Recommendations for Other Services    Frequency Min 3X/week   Progress towards PT Goals Progress towards PT goals: Progressing toward goals  Plan Current plan remains appropriate    Precautions / Restrictions Precautions Precautions: Fall Precaution Comments: watch HR and 02 sats Restrictions Weight Bearing Restrictions: No   Pertinent Vitals/Pain VSS, no pain    Mobility  Bed Mobility Overal bed mobility: Needs Assistance Bed Mobility: Supine to Sit Supine to sit: Min assist;HOB elevated General bed mobility comments: extra time, reaches for therapist's hand Transfers Overall transfer level: Needs assistance Equipment used: Rolling walker (2 wheeled) Transfers: Sit to/from Stand Sit to Stand: Min assist General transfer comment: assist to power up and control descent to chair.  still needs alot of assist to control descent Ambulation/Gait Ambulation/Gait assistance: Min guard;Min assist Ambulation Distance (Feet): 150 Feet Assistive device: Rolling walker (2 wheeled) Gait Pattern/deviations: Step-through pattern;Decreased step  length - right;Decreased step length - left;Shuffle;Trunk flexed;Wide base of support Gait velocity interpretation: Below normal speed for age/gender General Gait Details: Pt needed assist for postural stabiity as her stability gets worse as she fatigues.  Needed less steadying assist today.  Does well with RW.  Attempted ambulationwithout RW with need for min assit to steady therefore still needs RW at all times.  No desat with actvitiy.   Continue to work on spirometer with pt with improvements in ability to perform.    Exercises General Exercises - Lower Extremity Ankle Circles/Pumps: AROM;Both;10 reps;Supine Long Arc Quad: AROM;Both;10 reps;Seated Hip Flexion/Marching: AROM;Both;10 reps;Seated   PT Diagnosis:    PT Problem List:   PT Treatment Interventions:     PT Goals (current goals can now be found in the care plan section)    Visit Information  Last PT Received On: 11/26/13 Assistance Needed: +1 History of Present Illness: 80 year old woman with hx of CHF and severe MR admitted with sudden onset of SOB.  Pt has been on and off bipap since admission.    Subjective Data  Subjective: "I can walk better. "   Cognition  Cognition Arousal/Alertness: Awake/alert Behavior During Therapy: WFL for tasks assessed/performed Overall Cognitive Status: Within Functional Limits for tasks assessed    Balance  Balance Overall balance assessment: Needs assistance;History of Falls Sitting-balance support: Bilateral upper extremity supported;Feet supported Sitting balance-Leahy Scale: Good Standing balance support: Bilateral upper extremity supported;During functional activity Standing balance-Leahy Scale: Fair Standing balance comment: Continues to need RW for stability but improving balance.    End of Session PT - End of Session Equipment Utilized During Treatment: Gait belt Activity Tolerance:  Patient limited by fatigue Patient left: in chair;with call bell/phone within reach;with  family/visitor present Nurse Communication: Mobility status   GP     INGOLD,Jawana Reagor 11/26/2013, 4:14 PM [facility] [phone] (pager)

## 2013-11-26 NOTE — Progress Notes (Signed)
ANTICOAGULATION CONSULT NOTE - Follow Up Consult  Pharmacy Consult for Coumadin Indication: atrial fibrillation  Allergies  Allergen Reactions  . Codeine Nausea Only    Also dizziness    Patient Measurements: Height: 5\' 2"  (157.5 cm) Weight: 88 lb 6.5 oz (40.1 kg) IBW/kg (Calculated) : 50.1   Vital Signs: Temp: 97.9 F (36.6 C) (03/16 0403) Temp src: Oral (03/16 0403) BP: 98/66 mmHg (03/16 0629)  Labs:  Recent Labs  11/23/13 0835 11/24/13 0330 11/25/13 0340 11/25/13 1540 11/26/13 0257  LABPROT  --  24.4* 30.2*  --  29.2*  INR  --  2.28* 3.02*  --  2.89*  CREATININE 0.89  --   --  1.12* 1.06    Estimated Creatinine Clearance: 22.3 ml/min (by C-G formula based on Cr of 1.06).   Medications:  Scheduled:  . aspirin EC  81 mg Oral Daily  . digoxin  0.0625 mg Oral Daily  . feeding supplement (ENSURE COMPLETE)  237 mL Oral Q24H  . levothyroxine  75 mcg Oral QAC breakfast  . metoprolol  50 mg Oral BID  . pantoprazole  40 mg Oral BID  . polyethylene glycol  17 g Oral Daily  . simvastatin  40 mg Oral QHS  . sodium chloride  3 mL Intravenous Q12H  . torsemide  20 mg Oral BID  . Warfarin - Pharmacist Dosing Inpatient   Does not apply q1800    Assessment: 80 yo F on chronic coumadin for afib. The patient's INR therapeutic today. No new CBC since 3/10. No s/s bleeding noted.  PTA Coumadin dose 1.25 mg on MWFSat, and 2.5 mg TuThSun  Goal of Therapy:  INR 2-3   Plan:  1. Resume Coumadin today with 1.25 mg x 1 2. Continue daily PT/INR.  Tad MooreJessica Ronit Marczak, Pharm D, BCPS  Clinical Pharmacist Pager [phone]  11/26/2013 9:13 AM

## 2013-11-27 LAB — BASIC METABOLIC PANEL
BUN: 50 mg/dL — ABNORMAL HIGH (ref 6–23)
CO2: 34 meq/L — AB (ref 19–32)
CREATININE: 1.22 mg/dL — AB (ref 0.50–1.10)
Calcium: 10.1 mg/dL (ref 8.4–10.5)
Chloride: 83 mEq/L — ABNORMAL LOW (ref 96–112)
GFR calc Af Amer: 44 mL/min — ABNORMAL LOW (ref >90)
GFR calc non Af Amer: 38 mL/min — ABNORMAL LOW (ref >90)
Glucose, Bld: 92 mg/dL (ref 70–99)
Potassium: 3.2 mEq/L — ABNORMAL LOW (ref 3.7–5.3)
SODIUM: 133 meq/L — AB (ref 137–147)

## 2013-11-27 LAB — PROTIME-INR
INR: 2.8 — ABNORMAL HIGH (ref 0.00–1.49)
Prothrombin Time: 28.5 seconds — ABNORMAL HIGH (ref 11.6–15.2)

## 2013-11-27 LAB — DIGOXIN LEVEL: Digoxin Level: 1 ng/mL (ref 0.8–2.0)

## 2013-11-27 MED ORDER — ACETAMINOPHEN 325 MG PO TABS: 325.0000 mg | ORAL_TABLET | Freq: Four times a day (QID) | ORAL | Status: AC | PRN

## 2013-11-27 MED ORDER — POTASSIUM CHLORIDE CRYS ER 20 MEQ PO TBCR
40.0000 meq | EXTENDED_RELEASE_TABLET | Freq: Once | ORAL | Status: AC
  Administered 2013-11-27: 40 meq via ORAL
  Filled 2013-11-27: qty 1
  Filled 2013-11-27: qty 2

## 2013-11-27 MED ORDER — WARFARIN 1.25 MG HALF TABLET
1.2500 mg | ORAL_TABLET | Freq: Once | ORAL | Status: DC
  Filled 2013-11-27: qty 1

## 2013-11-27 MED ORDER — PANTOPRAZOLE SODIUM 40 MG PO TBEC: 40.0000 mg | DELAYED_RELEASE_TABLET | Freq: Two times a day (BID) | ORAL | Status: AC

## 2013-11-27 MED ORDER — TORSEMIDE 20 MG PO TABS: 20.0000 mg | ORAL_TABLET | Freq: Two times a day (BID) | ORAL | Status: DC

## 2013-11-27 MED ORDER — POTASSIUM CHLORIDE CRYS ER 20 MEQ PO TBCR
40.0000 meq | EXTENDED_RELEASE_TABLET | ORAL | Status: AC
  Administered 2013-11-27: 40 meq via ORAL
  Filled 2013-11-27: qty 2

## 2013-11-27 MED ORDER — METOPROLOL TARTRATE 50 MG PO TABS: 50.0000 mg | ORAL_TABLET | Freq: Two times a day (BID) | ORAL | Status: DC

## 2013-11-27 MED ORDER — ACETAMINOPHEN 325 MG PO TABS
325.0000 mg | ORAL_TABLET | Freq: Four times a day (QID) | ORAL | Status: DC | PRN
  Administered 2013-11-27: 325 mg via ORAL
  Filled 2013-11-27: qty 1

## 2013-11-27 MED ORDER — WARFARIN 1.25 MG HALF TABLET: 1.2500 mg | ORAL_TABLET | Freq: Once | ORAL | Status: AC

## 2013-11-27 MED ORDER — POTASSIUM CHLORIDE CRYS ER 20 MEQ PO TBCR: 20.0000 meq | EXTENDED_RELEASE_TABLET | Freq: Two times a day (BID) | ORAL | Status: AC

## 2013-11-27 NOTE — Progress Notes (Signed)
Pt states that she cannot sleep, and would like something to help her sleep. Dr. Terressa KoyanagiBalfour notified and stated patient can have tylenol. Will continue to monitor. Huel Coventryosenberger, Sherrilynn Gudgel A, RN

## 2013-11-27 NOTE — Clinical Social Work Placement (Signed)
     Clinical Social Work Department CLINICAL SOCIAL WORK PLACEMENT NOTE 11/27/2013  Patient:  Erin Rivers,Erin Rivers  Account Number:  [id number] Admit date:  11/17/2013  Clinical Social Worker:  Sharol HarnessPOONUM AMBELAL, Theresia MajorsLCSWA  Date/time:  11/23/2013 02:30 PM  Clinical Social Work is seeking post-discharge placement for this patient at the following level of care:   SKILLED NURSING   (*CSW will update this form in Epic as items are completed)   11/23/2013  Patient/family provided with [facility] Department of Clinical Social Works list of facilities offering this level of care within the geographic area requested by the patient (or if unable, by the patients family).  11/23/2013  Patient/family informed of their freedom to choose among providers that offer the needed level of care, that participate in Medicare, Medicaid or managed care program needed by the patient, have an available bed and are willing to accept the patient.  11/23/2013  Patient/family informed of [facility] ownership interest in [facility], as well as of the fact that they are under no obligation to receive care at this facility.  PASARR submitted to EDS on 11/23/2013 PASARR number received from EDS on 11/23/2013  FL2 transmitted to all facilities in geographic area requested by pt/family on  11/23/2013 FL2 transmitted to all facilities within larger geographic area on   Patient informed that his/her managed care company has contracts with or will negotiate with  certain facilities, including the following:   [facility]     Patient/family informed of bed offers received:  11/26/2013 [facility] Physician recommends and patient chooses bed at    [facility]  11/27/2013 Patient to be transferred to facility by Ambulance Sharin Mons(PTAR)  The following physician request were entered in Epic:   Additional Comments: 11/27/13 OK per MD for d/c today to  SNF.  Patient, husband, and daughter are very pleased wtih d/c plan and are excited about d/c today. She hopes to be able to return to her Independent Living arrangement after d/c is completed. Nursing notified to call report.  Lorri Frederickonna T. Yeraldin Litzenberger, LCSWA [phone]

## 2013-11-27 NOTE — Progress Notes (Signed)
Patient was [facility] around 1400. IV was taken out. She was transported by EMS with stretcher and oxygen at 2 L [place]. She had no signs of distress and no c/o pain. She was accompanied by her daughter and her husband. Report called to a nurse named Nettie ElmSylvia.

## 2013-11-27 NOTE — Progress Notes (Signed)
[facility]. I have given unit CSW a handoff and I am signing off.   Maryclare LabradorJulie Fabyan Loughmiller, MSW, [facility] Clinical Social Worker [phone]

## 2013-11-27 NOTE — Progress Notes (Signed)
I cosign with Demarcus Steward, Student RN on all assessments, medication administration, notes, and documentation for this shift. Wyman Meschke A, RN  

## 2013-11-27 NOTE — Progress Notes (Signed)
ANTICOAGULATION CONSULT NOTE - Follow Up Consult  Pharmacy Consult for Coumadin Indication: atrial fibrillation  Allergies  Allergen Reactions  . Codeine Nausea Only    Also dizziness    Patient Measurements: Height: 5\' 2"  (157.5 cm) Weight: 95 lb 10.9 oz (43.4 kg) IBW/kg (Calculated) : 50.1   Vital Signs: Temp: 98.1 F (36.7 C) (03/17 1037) Temp src: Oral (03/17 1037) BP: 101/61 mmHg (03/17 1037) Pulse Rate: 93 (03/17 1037)  Labs:  Recent Labs  11/25/13 0340 11/25/13 1540 11/26/13 0257 11/27/13 0552  HGB  --   --  9.1*  --   HCT  --   --  30.8*  --   PLT  --   --  389  --   LABPROT 30.2*  --  29.2* 28.5*  INR 3.02*  --  2.89* 2.80*  CREATININE  --  1.12* 1.06 1.22*    Estimated Creatinine Clearance: 21 ml/min (by C-G formula based on Cr of 1.22).   Medications:  Scheduled:  . aspirin EC  81 mg Oral Daily  . digoxin  0.0625 mg Oral Daily  . feeding supplement (ENSURE COMPLETE)  237 mL Oral Q24H  . levothyroxine  75 mcg Oral QAC breakfast  . metoprolol  50 mg Oral BID  . pantoprazole  40 mg Oral BID  . polyethylene glycol  17 g Oral Daily  . potassium chloride  40 mEq Oral NOW  . potassium chloride  40 mEq Oral Once  . simvastatin  40 mg Oral QHS  . sodium chloride  3 mL Intravenous Q12H  . torsemide  20 mg Oral BID  . Warfarin - Pharmacist Dosing Inpatient   Does not apply q1800    Assessment: 80 yo F on chronic coumadin for afib. The patient's INR therapeutic today. No new CBC since 3/10. No s/s bleeding noted.  PTA Coumadin dose 1.25 mg on MWFSat, and 2.5 mg TuThSun  Goal of Therapy:  INR 2-3   Plan:  1. Repeat Coumadin 1.25 mg x 1 today. 2. Continue daily PT/INR.  Tad MooreJessica Keondria Siever, Pharm D, BCPS  Clinical Pharmacist Pager [phone]  11/27/2013 11:29 AM

## 2013-11-27 NOTE — Care Management Note (Addendum)
  Page 2 of 2   11/27/2013     6:10:51 PM   CARE MANAGEMENT NOTE 11/27/2013  Patient:  Erin Rivers, Erin Rivers   Account Number:  [id number]  Date Initiated:  11/21/2013  Documentation initiated by:  MAYO,HENRIETTA  Subjective/Objective Assessment:   dx diastolic failure; lives with spouse @ [facility]    PCP  Dr Teressa Lower     Action/Plan:   Anticipated DC Date:  11/27/2013   Anticipated DC Plan:  [place] referral  Clinical Social Worker      DC Planning Services  CM consult      Choice offered to / List presented to:             Status of service:  Completed, signed off Medicare Important Message given?   (If response is "NO", the following Medicare IM given date fields will be blank) Date Medicare IM given:   Date Additional Medicare IM given:    Discharge Disposition:  [place]  Per UR Regulation:  Reviewed for med. necessity/level of care/duration of stay  If discussed at [place] of Stay Meetings, dates discussed:   11/22/2013    Comments:  11/27/2013 Dispo:  SNF/[facility] via ambulance [place], BSN, [place], CCM 11/27/2013  Contact:  Donado,Richard Spouse [phone]  11/22/13 [place] RN MSN BSN CCM PT/OT recommend SNF for rehab, pt and spouse agree.   CSW notified.  11/21/13 [place] MSN BSN CCM Met with pt and spouse @ bedside.  Pt is not on home O2, but required BiPAP during the night, now on 3L/min [place]. PT/OT evals pending.  Pt states h/o home health services through [facility] and was very pleased with services.

## 2013-11-27 NOTE — Plan of Care (Signed)
Problem: Phase I Progression Outcomes Goal: EF % per last Echo/documented,Core Reminder form on chart Outcome: Completed/Met Date Met:  11/27/13 EF 65% - 70% per echo on 11/18/13

## 2013-11-27 NOTE — Discharge Summary (Signed)
Patient ID: Erin Rivers MRN: [id number] DOB/AGE: 1921-08-25 80 y.o.  Admit date: 11/17/2013 Discharge date: 11/27/2013  Primary Discharge Diagnosis 1. Acute on chronic diastolic/valvular HF 2. Mitral valve prolapse with severe mitral regurgitation 3. Acute respiratory failure requiring BiPAP 4. Chronic atrial fibrillation with RVR on admit 5. Physical deconditioning  Hospital Course:   Erin Rivers is a delightful 80 y/o woman followed by [name]. She has a h/o chronic AF and diastolic/valvular HF due to Severe MR in the setting of bileaflet MVP. She has seen Dr. Silvestre Mesi at [facility] in December 2014 and felt not to be candidate for surgical MVR. She was then referred to Dr. Regino Schultze for consideration of MV clip and he felt that she was doing well enough with medical management at the time to defer the decision about the clip.   At baseline she does pretty well.  However she presented to ER on 3/7 with pulmonary edema and respiratory failure requiring BiPAP. She also had an increase int he rate of her chronic AF with HRs in the 130-140 range. Initial diuresis was sluggish on IV lasix and the HF team was consulted for further management.   Echo showed:  - Left ventricle: The cavity size was mildly dilated. Wall thickness was normal. Systolic function was vigorous. The estimated ejection fraction was in the range of 65% to 70%. Wall motion was normal; there were no regional wall motion abnormalities. - Aortic valve: Mild regurgitation. - Mitral valve: Severe prolapse, involving the posterior leaflet. Severe regurgitation. - Left atrium: The atrium was massively dilated. - Right atrium: The atrium was moderately to severely dilated. - Tricuspid valve: Moderate regurgitation. - Pulmonary arteries: Systolic pressure was moderately increased. PA peak pressure: 66mm Hg (S).   Her diuretic regimen was increased to lasix 80 IV bid and metolazone was added. She diuresed very well with her weight  going down from 101-> 88 pounds while on 2C. She was weaned off BiPAP. She worked with PT and was found to be very weak with exertional desaturations and was started on O2. SNF was recommended.  Her diuretics were changed to demadex 20 bid and she was transferred to the floor where her weight was measured at 95 pounds (different scale). Throughout the remainder of her hospitalization her creatinine trended up slightly and was 1.22 at the time of discharge. She also had hypokalemia and her potassium was supplemented.   I had a long discussion with her and her family about the options and felt that given her frail state that pursuing MV clipping might be too high risk at this point. We have opted to follow her closely in the HF Clinic with tight management of her volume status using demadex and prn metolazone.   For her AF her coumadin was continued and we increased her lopressor with good rate control.   F/u plans include: 1) Transfer to [facility] 2) Check BMET on Thursday 3/19 and fax to HF Clinic at [phone] 3) F/u HF Clinic Tuesday 3/24 at 9:40a    Discharge Info: Blood pressure 101/61, pulse 93, temperature 98.1 F (36.7 C), temperature source Oral, resp. rate 18, height 5\' 2"  (1.575 m), weight 95 lb 10.9 oz (43.4 kg), SpO2 98.00%.   Weight change: 8 lb 2.5 oz (3.7 kg)  Physical Exam:  General appearance: frail cachectic, sitting in bed  Kyphotic  Neck: JVD - 5-6cm  Lungs: clear. Mildly diminished aat bases  Heart: irregularly irregular rhythm and 3/6 MR  Extremities: warm no  edema  Neurologic: Mental status: alert and oriented x3 Hard of hearing Otherwise nonfocal  Results for orders placed during the hospital encounter of 11/17/13 (from the past 24 hour(s))  PROTIME-INR     Status: Abnormal   Collection Time    11/27/13  5:52 AM      Result Value Ref Range   Prothrombin Time 28.5 (*) 11.6 - 15.2 seconds   INR 2.80 (*) 0.00 - 1.49  BASIC METABOLIC PANEL     Status: Abnormal    Collection Time    11/27/13  5:52 AM      Result Value Ref Range   Sodium 133 (*) 137 - 147 mEq/L   Potassium 3.2 (*) 3.7 - 5.3 mEq/L   Chloride 83 (*) 96 - 112 mEq/L   CO2 34 (*) 19 - 32 mEq/L   Glucose, Bld 92  70 - 99 mg/dL   BUN 50 (*) 6 - 23 mg/dL   Creatinine, Ser 1.61 (*) 0.50 - 1.10 mg/dL   Calcium 09.6  8.4 - 04.5 mg/dL   GFR calc non Af Amer 38 (*) >90 mL/min   GFR calc Af Amer 44 (*) >90 mL/min  DIGOXIN LEVEL     Status: None   Collection Time    11/27/13  5:52 AM      Result Value Ref Range   Digoxin Level 1.0  0.8 - 2.0 ng/mL     Discharge Medications:   Medication List    STOP taking these medications       furosemide 40 MG tablet  Commonly known as:  LASIX      TAKE these medications       acetaminophen 325 MG tablet  Commonly known as:  TYLENOL  Take 1 tablet (325 mg total) by mouth every 6 (six) hours as needed for mild pain or headache.     aspirin 81 MG tablet  Take 81 mg by mouth daily.     CALTRATE 600+D 600-400 MG-UNIT per tablet  Generic drug:  Calcium Carbonate-Vitamin D  Take 1 tablet by mouth daily.     digoxin 0.125 MG tablet  Commonly known as:  LANOXIN  Take 0.5 tablets (0.0625 mg total) by mouth daily.     feeding supplement (ENSURE COMPLETE) Liqd  Take 237 mLs by mouth daily.     ibandronate 150 MG tablet  Commonly known as:  BONIVA  Take 150 mg by mouth every 30 (thirty) days. Take in the morning with a full glass of water, on an empty stomach, and do not take anything else by mouth or lie down for the next 30 min.     levothyroxine 75 MCG tablet  Commonly known as:  SYNTHROID, LEVOTHROID  Take 75 mcg by mouth daily before breakfast.     metoprolol 50 MG tablet  Commonly known as:  LOPRESSOR  Take 1 tablet (50 mg total) by mouth 2 (two) times daily.     pantoprazole 40 MG tablet  Commonly known as:  PROTONIX  Take 1 tablet (40 mg total) by mouth 2 (two) times daily.     potassium chloride SA 20 MEQ tablet  Commonly  known as:  KLOR-CON M20  Take 1 tablet (20 mEq total) by mouth 2 (two) times daily.     PRO-FLORA CONCENTRATE Caps  Take 1 capsule by mouth daily.     simvastatin 40 MG tablet  Commonly known as:  ZOCOR  Take 1 tablet (40 mg total) by mouth at bedtime.  torsemide 20 MG tablet  Commonly known as:  DEMADEX  Take 1 tablet (20 mg total) by mouth 2 (two) times daily.     vitamin C 500 MG tablet  Commonly known as:  ASCORBIC ACID  Take 500 mg by mouth daily.     Vitamin D 1000 UNITS capsule  Take 1,000 Units by mouth daily.     vitamin E 400 UNIT capsule  Take 400 Units by mouth daily.     warfarin 1.25 mg Tabs tablet  Commonly known as:  COUMADIN  Take 0.5 tablets (1.25 mg total) by mouth one time only at 6 PM.     warfarin 2.5 MG tablet  Commonly known as:  COUMADIN  - Take 1.25-2.5 mg by mouth See admin instructions. Takes 1.25mg  on Mon, Wed, Fri and Sat  - Takes 2.5mg  all other days        Follow-up Plans & Instructions: Discharge Orders   Future Appointments Provider Department Dept Phone   12/04/2013 9:40 AM [facility] [phone]   12/14/2013 11:15 AM [facility] [address] Office [phone]   Future Orders Complete By Expires   Beta Blocker already ordered  As directed    Contraindication to ACEI at discharge  As directed    Diet - low sodium heart healthy  As directed    Heart Failure patients record your daily weight using the same scale at the same time of day  As directed    Heart failure skilled nursing facility orders  As directed    Comments:     Heart Failure Follow-up Care: Verify follow-up appointments per Patient Discharge Instructions. Arrange transportation. Reconcile home medications with discharge medication list.  Assessments:  Vital signs and oxygen saturation daily and may decrease frequency as patient condition stabilizes. If being transitioned from SNF  facility to home, assess home environment for safety concerns, caregiver support and availability of low-sodium foods. Consult Estate manager/land agentocial Worker or Dietitian based on assessments. Assess for increased dyspnea, orthopnea, chest discomfort or chest pain, presence of rales/crackles, perripheral edema, abdominal distention, and/or weight gain of 3 pounds in one day or 5 pounds in one week. Daily Weights and Symptom Monitoring: Weigh daily before breakfast and after voiding using same scale and record weights on HF Zone Tool/Weight Chart to track weights with symptoms. Standing scale preferred. Call Physician/AHF Clinic for weight gain of 3 pounds or more in 24 hours or 5 pounds in one week, OR worsening symptoms as noted above. Activity:  Develop individualized activity plan with patient/caregiver. Patient Education: Teach patient/caregiver to weigh daily and record on weight chart. Reinforce the 5 Key Messages in the "Living with Heart Failure" book. Reinforce use of HF Zone Tool to support early symptom recognition and appropriate action. Reinforce low salt diet. Reinforce fluid restriction as ordered.   Questions:     Heart Failure Follow-up Care:  Advanced Heart Failure (AHF) Clinic at [phone]   Obtain the following labs:  Other see comments   Lab frequency:  Other see comments   Fax lab results to:  AHF Clinic at [phone]   Daily Weights and Symptom Monitoring:  Fax weight chart weekly X 2 to AHF Clinic at [phone]   Diet:  No Added Salt   PT/OT Consult:  Yes   Activity:  As tolerated   Fluid restrictions:  2000 mL Fluid   Increase activity slowly  As directed  BRING ALL MEDICATIONS WITH YOU TO FOLLOW UP APPOINTMENTS  Time spent with patient to include physician time: 45 minutes Signed:  Truman Hayward 12:14 PM

## 2013-11-28 ENCOUNTER — Encounter: Payer: Self-pay | Admitting: *Deleted

## 2013-11-29 ENCOUNTER — Non-Acute Institutional Stay (SKILLED_NURSING_FACILITY): Payer: Medicare Other | Admitting: Internal Medicine

## 2013-11-29 ENCOUNTER — Encounter: Payer: Self-pay | Admitting: Cardiovascular Disease

## 2013-11-29 ENCOUNTER — Encounter: Payer: Self-pay | Admitting: Internal Medicine

## 2013-11-29 ENCOUNTER — Telehealth: Payer: Self-pay | Admitting: Pulmonary Disease

## 2013-11-29 DIAGNOSIS — I4891 Unspecified atrial fibrillation: Secondary | ICD-10-CM

## 2013-11-29 DIAGNOSIS — E7800 Pure hypercholesterolemia, unspecified: Secondary | ICD-10-CM

## 2013-11-29 DIAGNOSIS — E875 Hyperkalemia: Secondary | ICD-10-CM

## 2013-11-29 DIAGNOSIS — E039 Hypothyroidism, unspecified: Secondary | ICD-10-CM

## 2013-11-29 DIAGNOSIS — I10 Essential (primary) hypertension: Secondary | ICD-10-CM

## 2013-11-29 DIAGNOSIS — M810 Age-related osteoporosis without current pathological fracture: Secondary | ICD-10-CM

## 2013-11-29 DIAGNOSIS — E440 Moderate protein-calorie malnutrition: Secondary | ICD-10-CM

## 2013-11-29 DIAGNOSIS — I340 Nonrheumatic mitral (valve) insufficiency: Secondary | ICD-10-CM

## 2013-11-29 DIAGNOSIS — Z7901 Long term (current) use of anticoagulants: Secondary | ICD-10-CM

## 2013-11-29 DIAGNOSIS — D649 Anemia, unspecified: Secondary | ICD-10-CM

## 2013-11-29 DIAGNOSIS — I059 Rheumatic mitral valve disease, unspecified: Secondary | ICD-10-CM

## 2013-11-29 DIAGNOSIS — I5033 Acute on chronic diastolic (congestive) heart failure: Secondary | ICD-10-CM

## 2013-11-29 NOTE — Assessment & Plan Note (Signed)
Pt is getting a feeding supplement

## 2013-11-29 NOTE — Assessment & Plan Note (Signed)
Pt's boniva was changed to fosamax;continue vit d and calcium

## 2013-11-29 NOTE — Telephone Encounter (Signed)
Called and lmom to make the pt and her husband aware of SN retiring from primary care and he will only be doing pulmonary only.  Pt is currently in rehab facility.

## 2013-11-29 NOTE — Assessment & Plan Note (Signed)
Resolved, now K+ on low end

## 2013-11-29 NOTE — Assessment & Plan Note (Signed)
Continue to keep INR between 2-3

## 2013-11-29 NOTE — Assessment & Plan Note (Signed)
Continue demedex . Pt is not now considered a surgical candidate.

## 2013-11-29 NOTE — Telephone Encounter (Signed)
lmomtcb x1 Pt [facility] Needs to be set up with new PCP.

## 2013-11-29 NOTE — Assessment & Plan Note (Signed)
Conntinue levothyroxine 75 mcg

## 2013-11-29 NOTE — Assessment & Plan Note (Signed)
Continue Zocor 40 mg

## 2013-11-29 NOTE — Assessment & Plan Note (Signed)
Her diuretic regimen was increased to lasix 80 IV bid and metolazone was added. She diuresed very well with her weight going down from 101-> 88 pounds while on 2C. She was weaned off BiPAP. She worked with PT and was found to be very weak with exertional desaturations and was started on O2. SNF was recommended.  Her diuretics were changed to demadex 20 bid and she was transferred to the floor where her weight was measured at 95 pounds (different scale). Throughout the remainder of her hospitalization her creatinine trended up slightly and was 1.22 at the time of discharge. She also had hypokalemia and her potassium was supplemented.  I had a long discussion with her and her family about the options and felt that given her frail state that pursuing MV clipping might be too high risk at this point. We have opted to follow her closely in the HF Clinic with tight management of her volume status using demadex and prn metolazone.  BMP today  12., 3.5, 86, 34, 158, 44/1.2, Ca 10.1, GFR 43.95 ; will fax to HF clinic and decrease demedex 20 mg to daily for 2 days to spare Na+; repeat BMP on 3/23;pt to f/u HF clinic 3/24

## 2013-11-29 NOTE — Assessment & Plan Note (Addendum)
Continue lanoxin, metoprolol  and coumadin

## 2013-11-29 NOTE — Assessment & Plan Note (Addendum)
On demedex BID, metoprolol 50 mg BID and blood pressure is lowish. It's a balancing act, between wet and dry.

## 2013-11-29 NOTE — Progress Notes (Signed)
MRN: [id number] Name: Erin Rivers  Sex: female Age: 80 y.o. DOB: 1920-11-02  [facility] Facility/Room:  701 Level Of Care: SNF Provider: Merrilee Seashore D Emergency Contacts: Extended Emergency Contact Information Primary Emergency Contact: Canniff,Richard Address: [address]          [place], [place] [address] [place] Home Phone: [phone] Relation: Spouse  Code Status: DNR  Allergies: Codeine  Chief Complaint  Patient presents with  . nursing home admission    HPI: Patient is 80 y.o. female who has just been d/c from hospital for acute on severely chronic CHF, here for OT/PT from deconditioning.  Past Medical History  Diagnosis Date  . MVP (mitral valve prolapse)   . Hyperlipidemia   . Hypertension   . Osteoporosis   . Anemia   . Hypothyroid   . Atrial fibrillation     permanent; coumadin Rx  . Mitral regurgitation     a. not a surgical candidate;  b. Echocardiogram 03/07/13: EF 55%, mild AI, severe myxomatous mitral valve with bileaflet prolapse and severe MR, severe LAE, moderate RAE, moderate TR, PASP 66 => to see Dr Earma Reading at [facility] to consider mitral clip 04/2013  . Hypercholesteremia   . Pneumonia   . Hearing loss   . Allergic rhinitis   . Chronic low back pain   . Shingles   . Chronic diastolic heart failure     Past Surgical History  Procedure Laterality Date  . Myringotomies        Medication List       This list is accurate as of: 11/29/13  4:09 PM.  Always use your most recent med list.               acetaminophen 325 MG tablet  Commonly known as:  TYLENOL  Take 1 tablet (325 mg total) by mouth every 6 (six) hours as needed for mild pain or headache.     aspirin 81 MG tablet  Take 81 mg by mouth daily.     CALTRATE 600+D 600-400 MG-UNIT per tablet  Generic drug:  Calcium Carbonate-Vitamin D  Take 1 tablet by mouth daily.     digoxin 0.125 MG tablet  Commonly known as:  LANOXIN  Take 0.5 tablets  (0.0625 mg total) by mouth daily.     feeding supplement (ENSURE COMPLETE) Liqd  Take 237 mLs by mouth daily.     ibandronate 150 MG tablet  Commonly known as:  BONIVA  Take 150 mg by mouth every 30 (thirty) days. Take in the morning with a full glass of water, on an empty stomach, and do not take anything else by mouth or lie down for the next 30 min.     levothyroxine 75 MCG tablet  Commonly known as:  SYNTHROID, LEVOTHROID  Take 75 mcg by mouth daily before breakfast. For hypothyroidism     metoprolol 50 MG tablet  Commonly known as:  LOPRESSOR  Take 1 tablet (50 mg total) by mouth 2 (two) times daily.     pantoprazole 40 MG tablet  Commonly known as:  PROTONIX  Take 1 tablet (40 mg total) by mouth 2 (two) times daily.     potassium chloride SA 20 MEQ tablet  Commonly known as:  KLOR-CON M20  Take 1 tablet (20 mEq total) by mouth 2 (two) times daily.     PRO-FLORA CONCENTRATE Caps  Take 1 capsule by mouth daily.     simvastatin 40 MG  tablet  Commonly known as:  ZOCOR  Take 1 tablet (40 mg total) by mouth at bedtime.     torsemide 20 MG tablet  Commonly known as:  DEMADEX  Take 1 tablet (20 mg total) by mouth 2 (two) times daily.     vitamin C 500 MG tablet  Commonly known as:  ASCORBIC ACID  Take 500 mg by mouth daily.     Vitamin D 1000 UNITS capsule  Take 1,000 Units by mouth daily.     warfarin 1.25 mg Tabs tablet  Commonly known as:  COUMADIN  Take 0.5 tablets (1.25 mg total) by mouth one time only at 6 PM.     warfarin 2.5 MG tablet  Commonly known as:  COUMADIN  - Take 1.25-2.5 mg by mouth See admin instructions. Takes 1.25mg  on Mon, Wed, Fri and Sat  - Takes 2.5mg  all other days        No orders of the defined types were placed in this encounter.    Immunization History  Administered Date(s) Administered  . Influenza Split 06/09/2011, 06/26/2012  . Influenza Whole 06/27/2006, 07/29/2009, 06/29/2010  . Influenza,inj,Quad PF,36+ Mos 05/23/2013     History  Substance Use Topics  . Smoking status: Former Smoker -- 10 years    Types: Cigarettes    Quit date: 09/13/1968  . Smokeless tobacco: Never Used     Comment: only smoked 1 cig per day  . Alcohol Use: Yes     Comment: 1 glass of wine with dinner    Family history is noncontributory    Review of Systems  DATA OBTAINED: from patient, family member GENERAL:  no fevers, fatigue easily, no appetite changes SKIN: No itching, rash or wounds EYES: No eye pain, redness, discharge EARS: No earache, tinnitus, change in hearing NOSE: No congestion, drainage or bleeding  MOUTH/THROAT: No mouth or tooth pain, No sore throat, No difficulty chewing or swallowing  RESPIRATORY: No cough, wheezing, SOB CARDIAC: No chest pain, palpitations, lower extremity edema  GI: No abdominal pain, No N/V/D or constipation, No heartburn or reflux  GU: No dysuria, frequency or urgency, or incontinence  MUSCULOSKELETAL: No unrelieved bone/joint pain NEUROLOGIC: No headache, dizziness or focal weakness PSYCHIATRIC: No overt anxiety or sadness. Sleeps well. No behavior issue.   Filed Vitals:   11/29/13 1526  BP: 95/37  Pulse: 86  Temp: 97.8 F (36.6 C)  Resp: 16    Physical Exam  GENERAL APPEARANCE: Alert, conversant. Appropriately groomed. No acute distress. In PT-on O2 [place] SKIN: No diaphoresis rash, or wounds HEAD: Normocephalic, atraumatic  EYES: Conjunctiva/lids clear. Pupils round, reactive. EOMs intact.  EARS: External exam WNL, canals clear. Hearing grossly normal.  NOSE: No deformity or discharge.  MOUTH/THROAT: Lips w/o lesions RESPIRATORY: Breathing is even, unlabored. Lung sounds are clear   CARDIOVASCULAR: Heart RRR, 4/6 murmur at RUSB no rubs or gallops. No peripheral edema.   GASTROINTESTINAL: Abdomen is soft, non-tender, not distended w/ normal bowel sounds GENITOURINARY: Bladder non tender, not distended  MUSCULOSKELETAL: No abnormal joints or musculature NEUROLOGIC:  Oriented X3. Cranial nerves 2-12 grossly intact. Moves all extremities no tremor. PSYCHIATRIC: Mood and affect appropriate to situation, no behavioral issues  Patient Active Problem List   Diagnosis Date Noted  . Moderate tricuspid regurgitation 11/19/2013  . Pulmonary hypertension, moderate (PA 66 mmHg 11/18/13) 11/19/2013  . Chronic anticoagulation 11/19/2013  . Elevated troponin 11/19/2013  . Hyperkalemia 11/19/2013  . Acute on chronic diastolic HF (heart failure) 11/18/2013  . Epistaxis 08/28/2013  .  Malnutrition of moderate degree 03/09/2013  . HYPERCHOLESTEROLEMIA 10/09/2010  . ANEMIA 07/16/2010  . Unspecified essential hypertension 01/13/2009  . HEARING LOSS 01/18/2008  . Severe mitral regurgitation 01/18/2008  . MITRAL VALVE PROLAPSE 01/18/2008  . ATRIAL FIBRILLATION, CHRONIC 01/18/2008  . POSITIVE PPD 01/18/2008  . HYPOTHYROIDISM 01/17/2008  . ALLERGIC RHINITIS 01/17/2008  . LOW BACK PAIN, CHRONIC 01/17/2008  . OSTEOPOROSIS 01/17/2008  . SHINGLES, HX OF 01/17/2008    CBC    Component Value Date/Time   WBC 11.1* 11/26/2013 0257   RBC 3.99 11/26/2013 0257   HGB 9.1* 11/26/2013 0257   HCT 30.8* 11/26/2013 0257   PLT 389 11/26/2013 0257   MCV 77.2* 11/26/2013 0257   LYMPHSABS 1.4 03/21/2013 1156   MONOABS 0.8 03/21/2013 1156   EOSABS 0.1 03/21/2013 1156   BASOSABS 0.0 03/21/2013 1156    CMP     Component Value Date/Time   NA 133* 11/27/2013 0552   K 3.2* 11/27/2013 0552   CL 83* 11/27/2013 0552   CO2 34* 11/27/2013 0552   GLUCOSE 92 11/27/2013 0552   BUN 50* 11/27/2013 0552   CREATININE 1.22* 11/27/2013 0552   CALCIUM 10.1 11/27/2013 0552   PROT 7.9 11/17/2013 2206   ALBUMIN 4.1 11/17/2013 2206   AST 30 11/17/2013 2206   ALT 12 11/17/2013 2206   ALKPHOS 101 11/17/2013 2206   BILITOT 0.7 11/17/2013 2206   GFRNONAA 38* 11/27/2013 0552   GFRAA 44* 11/27/2013 0552    Assessment and Plan  Acute on chronic diastolic HF (heart failure) Her diuretic regimen was increased to lasix 80 IV bid  and metolazone was added. She diuresed very well with her weight going down from 101-> 88 pounds while on 2C. She was weaned off BiPAP. She worked with PT and was found to be very weak with exertional desaturations and was started on O2. SNF was recommended.  Her diuretics were changed to demadex 20 bid and she was transferred to the floor where her weight was measured at 95 pounds (different scale). Throughout the remainder of her hospitalization her creatinine trended up slightly and was 1.22 at the time of discharge. She also had hypokalemia and her potassium was supplemented.  I had a long discussion with her and her family about the options and felt that given her frail state that pursuing MV clipping might be too high risk at this point. We have opted to follow her closely in the HF Clinic with tight management of her volume status using demadex and prn metolazone.  BMP today  12., 3.5, 86, 34, 158, 44/1.2, Ca 10.1, GFR 43.95 ; will fax to HF clinic and decrease demedex 20 mg to daily for 2 days to spare Na+; repeat BMP on 3/23;pt to f/u HF clinic 3/24   Severe mitral regurgitation Continue demedex . Pt is not now considered a surgical candidate.  Unspecified essential hypertension On demedex BID, metoprolol 50 mg BID and blood pressure is lowish. It's a balancing act, between wet and dry.  ATRIAL FIBRILLATION, CHRONIC Continue lanoxin, metoprolol  and coumadin  HYPOTHYROIDISM Conntinue levothyroxine 75 mcg  OSTEOPOROSIS Pt's boniva was changed to fosamax;continue vit d and calcium  Hyperkalemia Resolved, now K+ on low end  Chronic anticoagulation Continue to keep INR between 2-3  HYPERCHOLESTEROLEMIA Continue Zocor 40 mg  ANEMIA Stable at around 9.0; MCV is low so prob iron def. Will start ferrous sulfate 325 mg daily  Malnutrition of moderate degree Pt is getting a feeding supplement    Todd Argabright,  Noah Delaine, MD

## 2013-11-29 NOTE — Assessment & Plan Note (Signed)
Stable at around 9.0; MCV is low so prob iron def. Will start ferrous sulfate 325 mg daily

## 2013-12-03 ENCOUNTER — Other Ambulatory Visit: Payer: Self-pay | Admitting: *Deleted

## 2013-12-03 ENCOUNTER — Encounter: Payer: Self-pay | Admitting: Cardiovascular Disease

## 2013-12-03 MED ORDER — LORAZEPAM 0.5 MG PO TABS: ORAL_TABLET | ORAL | Status: DC

## 2013-12-03 NOTE — Telephone Encounter (Signed)
[facility] 

## 2013-12-04 ENCOUNTER — Encounter: Payer: Self-pay | Admitting: Cardiovascular Disease

## 2013-12-04 ENCOUNTER — Encounter (HOSPITAL_COMMUNITY): Payer: Medicare Other

## 2013-12-11 ENCOUNTER — Other Ambulatory Visit: Payer: Self-pay | Admitting: Pulmonary Disease

## 2013-12-12 ENCOUNTER — Telehealth: Payer: Self-pay | Admitting: *Deleted

## 2013-12-12 NOTE — Telephone Encounter (Signed)
Received call from Erin Rivers concerned [facility] and he wants to be sure they are checking her INR and dosing her coumadin. This nurse called [name] and [facility] Super[facility] and she states they are checking  her INR and dosing her coumadin and they will call and let us know when she is discharged so we can schedule an appt for her to be seen in clinic. This nurse then called Erin Rivers cell number and spoke with his daughter and gave her above message and she states understanding and that she will inform Erin Rivers.Also instructed family to call us as well when she goes home and she states understanding

## 2013-12-14 ENCOUNTER — Encounter: Payer: Self-pay | Admitting: Internal Medicine

## 2013-12-14 ENCOUNTER — Non-Acute Institutional Stay (SKILLED_NURSING_FACILITY): Payer: Medicare Other | Admitting: Internal Medicine

## 2013-12-14 DIAGNOSIS — I10 Essential (primary) hypertension: Secondary | ICD-10-CM

## 2013-12-14 DIAGNOSIS — I059 Rheumatic mitral valve disease, unspecified: Secondary | ICD-10-CM

## 2013-12-14 DIAGNOSIS — E7800 Pure hypercholesterolemia, unspecified: Secondary | ICD-10-CM

## 2013-12-14 DIAGNOSIS — D649 Anemia, unspecified: Secondary | ICD-10-CM

## 2013-12-14 DIAGNOSIS — I5033 Acute on chronic diastolic (congestive) heart failure: Secondary | ICD-10-CM

## 2013-12-14 DIAGNOSIS — E440 Moderate protein-calorie malnutrition: Secondary | ICD-10-CM

## 2013-12-14 DIAGNOSIS — B370 Candidal stomatitis: Secondary | ICD-10-CM

## 2013-12-14 DIAGNOSIS — M810 Age-related osteoporosis without current pathological fracture: Secondary | ICD-10-CM

## 2013-12-14 DIAGNOSIS — E039 Hypothyroidism, unspecified: Secondary | ICD-10-CM

## 2013-12-14 DIAGNOSIS — Z7901 Long term (current) use of anticoagulants: Secondary | ICD-10-CM

## 2013-12-14 DIAGNOSIS — I340 Nonrheumatic mitral (valve) insufficiency: Secondary | ICD-10-CM

## 2013-12-14 DIAGNOSIS — I4891 Unspecified atrial fibrillation: Secondary | ICD-10-CM

## 2013-12-14 NOTE — Progress Notes (Signed)
MRN: [id number] Name: Erin Rivers  Sex: female Age: 80 y.o. DOB: [birth year]  PSC #: [facility] Facility/Room: 701 Level Of Care: SNF Provider: Merrilee SeashoreALEXANDER, Madlyn Crosby D Emergency Contacts: Extended Emergency Contact Information Primary Emergency Contact: Ehly,Richard Address: [address]          [place], [place] [address] [place] Home Phone: [phone] Relation: Spouse  Code Status: DNR  Allergies: Codeine  Chief Complaint  Patient presents with  . Discharge Note    HPI: Patient is 80 y.o. female who was admitted after being hospitalized for acute CHF and atrial fib who is ready to be discharged to home.  Past Medical History  Diagnosis Date  . MVP (mitral valve prolapse)   . Hyperlipidemia   . Hypertension   . Osteoporosis   . Anemia   . Hypothyroid   . Atrial fibrillation     permanent; coumadin Rx  . Mitral regurgitation     a. not a surgical candidate;  b. 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 Hypercholesteremia   . Pneumonia   . Hearing loss   . Allergic rhinitis   . Chronic low back pain   . Shingles   . Chronic diastolic heart failure     Past Surgical History  Procedure Laterality Date  . Myringotomies        Medication List       This list is accurate as of: 12/14/13  1:19 PM.  Always use your most recent med list.               acetaminophen 325 MG tablet  Commonly known as:  TYLENOL  Take 1 tablet (325 mg total) by mouth every 6 (six) hours as needed for mild pain or headache.     alendronate 70 MG tablet  Commonly known as:  FOSAMAX  Take 70 mg by mouth once a week. Take with a full glass of water on an empty stomach.     aspirin 81 MG tablet  Take 81 mg by mouth daily.     CALTRATE 600+D 600-400 MG-UNIT per tablet  Generic drug:   Calcium Carbonate-Vitamin D  Take 1 tablet by mouth daily.     digoxin 0.125 MG tablet  Commonly known as:  LANOXIN  Take 0.5 tablets (0.0625 mg total) by mouth daily.     ferrous sulfate 325 (65 FE) MG tablet  Take 325 mg by mouth daily with breakfast.     levothyroxine 75 MCG tablet  Commonly known as:  SYNTHROID, LEVOTHROID  TAKE 1 TABLET DAILY     levothyroxine 75 MCG tablet  Commonly known as:  SYNTHROID, LEVOTHROID  Take 75 mcg by mouth daily before breakfast. For hypothyroidism     LORazepam 0.5 MG tablet  Commonly known as:  ATIVAN  0.5 mg 2 (two) times daily. And Take 1/2 tablet by mouth q HS     metoprolol 50 MG tablet  Commonly known as:  LOPRESSOR  Take 1 tablet (50 mg total) by mouth 2 (two) times daily.     pantoprazole 40 MG tablet  Commonly known as:  PROTONIX  Take 1 tablet (40 mg total) by mouth 2 (two) times daily.     potassium chloride SA 20 MEQ tablet  Commonly known as:  KLOR-CON M20  Take  1 tablet (20 mEq total) by mouth 2 (two) times daily.     PRO-FLORA CONCENTRATE Caps  Take 1 capsule by mouth daily.     simvastatin 40 MG tablet  Commonly known as:  ZOCOR  Take 1 tablet (40 mg total) by mouth at bedtime.     torsemide 20 MG tablet  Commonly known as:  DEMADEX  Take 1 tablet (20 mg total) by mouth 2 (two) times daily.     Vitamin D 1000 UNITS capsule  Take 1,000 Units by mouth daily.     warfarin 1.25 mg Tabs tablet  Commonly known as:  COUMADIN  Take 0.5 tablets (1.25 mg total) by mouth one time only at 6 PM.        Meds ordered this encounter  Medications  . alendronate (FOSAMAX) 70 MG tablet    Sig: Take 70 mg by mouth once a week. Take with a full glass of water on an empty stomach.  . ferrous sulfate 325 (65 FE) MG tablet    Sig: Take 325 mg by mouth daily with breakfast.  . LORazepam (ATIVAN) 0.5 MG tablet    Sig: 0.5 mg 2 (two) times daily. And Take 1/2 tablet by mouth q HS    Immunization History  Administered Date(s)  Administered  . Influenza Split 06/09/2011, 06/26/2012  . Influenza Whole 06/27/2006, 07/29/2009, 06/29/2010  . Influenza,inj,Quad PF,36+ Mos 05/23/2013    History  Substance Use Topics  . Smoking status: Former Smoker -- 10 years    Types: Cigarettes    Quit date: 09/13/1968  . Smokeless tobacco: Never Used     Comment: only smoked 1 cig per day  . Alcohol Use: Yes     Comment: 1 glass of wine with dinner    Filed Vitals:   12/14/13 1301  BP: 120/69  Pulse: 90  Temp: 97.9 F (36.6 C)  Resp: 21    Physical Exam  GENERAL APPEARANCE: Alert, conversant. Appropriately groomed. No acute distress.  HEENT: white plaques in mouth RESPIRATORY: Breathing is even, unlabored. Lung sounds are clear   CARDIOVASCULAR: Heart RRR no murmurs, rubs or gallops. No peripheral edema.  GASTROINTESTINAL: Abdomen is soft, non-tender, not distended w/ normal bowel sounds.  NEUROLOGIC: Cranial nerves 2-12 grossly intact. Moves all extremities no tremor.  Patient Active Problem List   Diagnosis Date Noted  . Moderate tricuspid regurgitation 11/19/2013  . Pulmonary hypertension, moderate (PA 66 mmHg 11/18/13) 11/19/2013  . Chronic anticoagulation 11/19/2013  . Elevated troponin 11/19/2013  . Hyperkalemia 11/19/2013  . Acute on chronic diastolic HF (heart failure) 11/18/2013  . Epistaxis 08/28/2013  . Malnutrition of moderate degree 03/09/2013  . HYPERCHOLESTEROLEMIA 10/09/2010  . ANEMIA 07/16/2010  . Unspecified essential hypertension 01/13/2009  . HEARING LOSS 01/18/2008  . Severe mitral regurgitation 01/18/2008  . MITRAL VALVE PROLAPSE 01/18/2008  . ATRIAL FIBRILLATION, CHRONIC 01/18/2008  . POSITIVE PPD 01/18/2008  . HYPOTHYROIDISM 01/17/2008  . ALLERGIC RHINITIS 01/17/2008  . LOW BACK PAIN, CHRONIC 01/17/2008  . OSTEOPOROSIS 01/17/2008  . SHINGLES, HX OF 01/17/2008    CBC    Component Value Date/Time   WBC 11.1* 11/26/2013 0257   RBC 3.99 11/26/2013 0257   HGB 9.1* 11/26/2013 0257    HCT 30.8* 11/26/2013 0257   PLT 389 11/26/2013 0257   MCV 77.2* 11/26/2013 0257   LYMPHSABS 1.4 03/21/2013 1156   MONOABS 0.8 03/21/2013 1156   EOSABS 0.1 03/21/2013 1156   BASOSABS 0.0 03/21/2013 1156    CMP  Component Value Date/Time   NA 133* 11/27/2013 0552   K 3.2* 11/27/2013 0552   CL 83* 11/27/2013 0552   CO2 34* 11/27/2013 0552   GLUCOSE 92 11/27/2013 0552   BUN 50* 11/27/2013 0552   CREATININE 1.22* 11/27/2013 0552   CALCIUM 10.1 11/27/2013 0552   PROT 7.9 11/17/2013 2206   ALBUMIN 4.1 11/17/2013 2206   AST 30 11/17/2013 2206   ALT 12 11/17/2013 2206   ALKPHOS 101 11/17/2013 2206   BILITOT 0.7 11/17/2013 2206   GFRNONAA 38* 11/27/2013 0552   GFRAA 44* 11/27/2013 0552    Assessment and Plan  Pt is d/c to home with HH/OT/PT/CNA/nursing. Pt is on 2L continuous O2.  Margit Hanks, MD

## 2013-12-17 ENCOUNTER — Telehealth: Payer: Self-pay | Admitting: Pulmonary Disease

## 2013-12-17 NOTE — Telephone Encounter (Signed)
Order given for hospice earlier.  They will be going out tomorrow to visit the pt and they are needing orders for labs.  Looks like the coumadin is followed by cardiology.   SN please advise. Thanks

## 2013-12-17 NOTE — Telephone Encounter (Signed)
Called and spoke with Erin Rivers from hospice and she was given the ok for the hospice order and for the hospice doctor to do symptom management.  Nothing further is needed.

## 2013-12-17 NOTE — Telephone Encounter (Signed)
Per SN---  Ok to send order for labs via hospice.  This has been done and faxed over to hospice.  Called and spoke with evette and she is aware.  Results will need to be reported or called to the [place] [facility].

## 2013-12-18 ENCOUNTER — Telehealth: Payer: Self-pay | Admitting: Pulmonary Disease

## 2013-12-18 ENCOUNTER — Ambulatory Visit (INDEPENDENT_AMBULATORY_CARE_PROVIDER_SITE_OTHER): Payer: Medicare Other | Admitting: Pharmacist Clinician (PhC)/ Clinical Pharmacy Specialist

## 2013-12-18 DIAGNOSIS — I4891 Unspecified atrial fibrillation: Secondary | ICD-10-CM

## 2013-12-18 LAB — POCT INR: INR: 1.7

## 2013-12-18 NOTE — Telephone Encounter (Signed)
Erin Rivers returned call.  Spoke with Erin Rivers who reported that pt's INR today was 1.7.  Per Erin Rivers, the family had reported that pt's INR 2 days ago was 1.9 and was instructed to hold pt's coumadin.  Erin Rivers asking we contact [facility] and let her know of their recommendations.  Advised will call her back shortly.  Called [facility], spoke with pharm-D Erin Rivers.  We found that the CC had already been contacted by another of pt's Hospice Nurses about the INR results and is already aware of the dosing recommendations.  They are as follows: Description    Take an extra half tablet today, Tuesday April 7, then continue 1/2 tablet every Monday, Wednesday and Friday and 1 tablet all other days, repeat INR in 1 week. Erin Rivers([name] with Hospice with patient, will relay information   Patient Findings    Comments [facility]. Husband states were told to hold two days, restarted yesterday at 2.5mg  qd x 1.25mg  MWF   Eldridge Abrahamsalled Erin Rivers again who was just [facility] of the [facility].  Nothing further needed; will sign off and forward to SN as FYI.

## 2013-12-18 NOTE — Telephone Encounter (Signed)
lmomtcb x1 for tracy 

## 2013-12-19 ENCOUNTER — Encounter (HOSPITAL_COMMUNITY): Payer: Medicare Other

## 2013-12-19 ENCOUNTER — Telehealth: Payer: Self-pay | Admitting: Pulmonary Disease

## 2013-12-19 NOTE — Telephone Encounter (Signed)
Called gave VO to Centererese. Nothing further needed

## 2013-12-19 NOTE — Telephone Encounter (Signed)
Per SN---  Ok to use the neosporin on the outside edges of the ulcers.   Ok for hospice MD to sign the DNR for the pt.    thanks

## 2013-12-19 NOTE — Telephone Encounter (Signed)
Spoke with Terese-hopsice nurse. She needs VO for hospice doc to sign DNR for pt? Also pt has 4 areas on thigh-stage 2 pressure ulcers. She put duoderm to help protect the area. Wants a VO to use neosporin on the outside edges for when it is painful and red? Please advise SN thanks

## 2013-12-20 ENCOUNTER — Telehealth: Payer: Self-pay

## 2013-12-20 NOTE — Telephone Encounter (Signed)
Yes ok to stop coumadin

## 2013-12-20 NOTE — Telephone Encounter (Signed)
Dr Smith MinceMazzocchi Hospice MD called to inquire about pt stopping Coumadin.  Pt's health is steadily declining and is now on Hospice and Dr Smith MinceMazzocchi wants to know if Dr Eden EmmsNishan is agreeable to stop pt's Coumadin and leave her on an ASA QD.  Please call and advise.  Dr Mazzocchi's cell # is [phone].  Thanks

## 2013-12-20 NOTE — Telephone Encounter (Signed)
Yes ok to stop coumadin  

## 2013-12-21 ENCOUNTER — Telehealth: Payer: Self-pay | Admitting: Pulmonary Disease

## 2013-12-21 NOTE — Telephone Encounter (Signed)
Called Dr Smith MinceMazzocchi advised OK to stop Coumadin per Dr Eden EmmsNishan.

## 2013-12-21 NOTE — Telephone Encounter (Signed)
called terese and medications confirmed as above. Medication list updated. Nothing further needed

## 2013-12-24 ENCOUNTER — Telehealth: Payer: Self-pay | Admitting: Pulmonary Disease

## 2013-12-24 NOTE — Telephone Encounter (Signed)
Per SN---  SN can certainly continue to see Erin Rivers on hospice.  We usually get hospice physicians to help with symptom management.  SN will cont to be her attending.  thanks

## 2013-12-24 NOTE — Telephone Encounter (Signed)
Please advise SN thanks 

## 2013-12-24 NOTE — Telephone Encounter (Signed)
Terese advised. Carron CurieJennifer Castillo, CMA

## 2013-12-27 ENCOUNTER — Other Ambulatory Visit: Payer: Self-pay | Admitting: Pulmonary Disease

## 2013-12-31 ENCOUNTER — Ambulatory Visit: Payer: Medicare Other | Admitting: Pulmonary Disease

## 2014-01-04 ENCOUNTER — Telehealth: Payer: Self-pay | Admitting: Pulmonary Disease

## 2014-01-04 NOTE — Telephone Encounter (Signed)
terese from hospice is calling and wanting to know if SN wants the pt to cont her asa 81 mg daily.  Looks like pt had blood in her stool and possibly her urine.  SN please advise if pt should continue taking the aspirin daily.  Thanks  Allergies  Allergen Reactions  . Codeine Nausea Only    Also dizziness     Current Outpatient Prescriptions on File Prior to Visit  Medication Sig Dispense Refill  . acetaminophen (TYLENOL) 325 MG tablet Take 1 tablet (325 mg total) by mouth every 6 (six) hours as needed for mild pain or headache.  30 tablet  1  . alendronate (FOSAMAX) 70 MG tablet Take 70 mg by mouth once a week. Take with a full glass of water on an empty stomach.      Marland Kitchen. aspirin 81 MG tablet Take 81 mg by mouth daily.        . Calcium Carbonate-Vitamin D (CALTRATE 600+D) 600-400 MG-UNIT per tablet Take 1 tablet by mouth daily.        . Cholecalciferol (VITAMIN D) 1000 UNITS capsule Take 1,000 Units by mouth daily.        . digoxin (LANOXIN) 0.125 MG tablet Take 0.5 tablets (0.0625 mg total) by mouth daily.  30 tablet  11  . ferrous sulfate 325 (65 FE) MG tablet Take 325 mg by mouth daily with breakfast.      . furosemide (LASIX) 40 MG tablet TAKE 1 TABLET TWICE A DAY  180 tablet  0  . levothyroxine (SYNTHROID, LEVOTHROID) 75 MCG tablet Take 75 mcg by mouth daily before breakfast. For hypothyroidism      . levothyroxine (SYNTHROID, LEVOTHROID) 75 MCG tablet TAKE 1 TABLET DAILY  90 tablet  2  . LORazepam (ATIVAN) 0.5 MG tablet 0.5 mg 2 (two) times daily. And Take 1/2 tablet by mouth q HS      . metoprolol tartrate (LOPRESSOR) 25 MG tablet Take 12.5 mg by mouth daily.      . pantoprazole (PROTONIX) 40 MG tablet Take 1 tablet (40 mg total) by mouth 2 (two) times daily.  30 tablet  1  . potassium chloride SA (KLOR-CON M20) 20 MEQ tablet Take 1 tablet (20 mEq total) by mouth 2 (two) times daily.  60 tablet  1  . Probiotic Product (PRO-FLORA CONCENTRATE) CAPS Take 1 capsule by mouth daily.         Bernadette Hoit. Sennosides-Docusate Sodium (SENNA PLUS PO) Take 1-5 tablets by mouth 2 (two) times daily as needed.      . simvastatin (ZOCOR) 40 MG tablet Take 1 tablet (40 mg total) by mouth at bedtime.  90 tablet  3  . torsemide (DEMADEX) 20 MG tablet Take 1 tablet (20 mg total) by mouth 2 (two) times daily.  60 tablet  1  . traMADol (ULTRAM) 50 MG tablet Take 50 mg by mouth 3 (three) times daily as needed.      . warfarin (COUMADIN) 1.25 mg TABS tablet Take 0.5 tablets (1.25 mg total) by mouth one time only at 6 PM.  1 tablet  1   No current facility-administered medications on file prior to visit.

## 2014-01-04 NOTE — Telephone Encounter (Signed)
Per SN---   Ok for the pt to stop the aspirin.  Called and spoke with teress and she is aware.  pts med list has been updated.

## 2014-01-08 ENCOUNTER — Telehealth: Payer: Self-pay | Admitting: Pulmonary Disease

## 2014-01-08 NOTE — Telephone Encounter (Signed)
Per SN---  Can decrease the demadex 20 mg  To 1 every morning.  Called and spoke with [name] and she is aware of SN recs.  [name] also stated that pt was having trouble sleeping and was given xanax 0.25  2 po qhs by the hospice doctor to use for sleep.  Med list has been updated.

## 2014-01-08 NOTE — Telephone Encounter (Signed)
Called and spoke with Erin Rivers from hospice and she stated that the pts pulse is 98 and rapid, irregular and bounding.   BP is 100/58 sitting.  Pt is taking   demadex 20 mg 1 BID Lanoxin 0.125  1/2 daily  Pt has no swelling.  Erin Rivers wanted to see if SN wanting to decrease the meds that she is on.  SN please advise. Thanks  Allergies  Allergen Reactions  . Codeine Nausea Only    Also dizziness    Current Outpatient Prescriptions on File Prior to Visit  Medication Sig Dispense Refill  . acetaminophen (TYLENOL) 325 MG tablet Take 1 tablet (325 mg total) by mouth every 6 (six) hours as needed for mild pain or headache.  30 tablet  1  . alendronate (FOSAMAX) 70 MG tablet Take 70 mg by mouth once a week. Take with a full glass of water on an empty stomach.      . Calcium Carbonate-Vitamin D (CALTRATE 600+D) 600-400 MG-UNIT per tablet Take 1 tablet by mouth daily.        . Cholecalciferol (VITAMIN D) 1000 UNITS capsule Take 1,000 Units by mouth daily.        . digoxin (LANOXIN) 0.125 MG tablet Take 0.5 tablets (0.0625 mg total) by mouth daily.  30 tablet  11  . ferrous sulfate 325 (65 FE) MG tablet Take 325 mg by mouth daily with breakfast.      . furosemide (LASIX) 40 MG tablet TAKE 1 TABLET TWICE A DAY  180 tablet  0  . levothyroxine (SYNTHROID, LEVOTHROID) 75 MCG tablet Take 75 mcg by mouth daily before breakfast. For hypothyroidism      . levothyroxine (SYNTHROID, LEVOTHROID) 75 MCG tablet TAKE 1 TABLET DAILY  90 tablet  2  . LORazepam (ATIVAN) 0.5 MG tablet 0.5 mg 2 (two) times daily. And Take 1/2 tablet by mouth q HS      . metoprolol tartrate (LOPRESSOR) 25 MG tablet Take 12.5 mg by mouth daily.      . pantoprazole (PROTONIX) 40 MG tablet Take 1 tablet (40 mg total) by mouth 2 (two) times daily.  30 tablet  1  . potassium chloride SA (KLOR-CON M20) 20 MEQ tablet Take 1 tablet (20 mEq total) by mouth 2 (two) times daily.  60 tablet  1  . Probiotic Product (PRO-FLORA CONCENTRATE) CAPS Take 1  capsule by mouth daily.        Bernadette Hoit. Sennosides-Docusate Sodium (SENNA PLUS PO) Take 1-5 tablets by mouth 2 (two) times daily as needed.      . simvastatin (ZOCOR) 40 MG tablet Take 1 tablet (40 mg total) by mouth at bedtime.  90 tablet  3  . torsemide (DEMADEX) 20 MG tablet Take 1 tablet (20 mg total) by mouth 2 (two) times daily.  60 tablet  1  . traMADol (ULTRAM) 50 MG tablet Take 50 mg by mouth 3 (three) times daily as needed.      . warfarin (COUMADIN) 1.25 mg TABS tablet Take 0.5 tablets (1.25 mg total) by mouth one time only at 6 PM.  1 tablet  1   No current facility-administered medications on file prior to visit.

## 2014-01-14 ENCOUNTER — Telehealth: Payer: Self-pay | Admitting: Pulmonary Disease

## 2014-01-14 MED ORDER — MORPHINE SULFATE (CONCENTRATE) 20 MG/ML PO SOLN: ORAL | Status: AC

## 2014-01-14 NOTE — Telephone Encounter (Signed)
Called and spoke with terese with hospice and she is aware that we will be faxing over rx for the morphine to the pharmacy.  Nothing further is needed.

## 2014-01-14 NOTE — Telephone Encounter (Signed)
Spoke with Roselandherese with hospice.  Pt having increased SOB, restlessness, increased heart rate (122), oxygen sats are 97% on 2L.  Requesting order for Morphine 20mg /ml  Take 5-10 mg every 2-4 hrs prn sob or pain.  [facility] Must write Hospice pt on rx.  Please advise.

## 2014-02-11 DEATH — deceased

## 2014-05-25 IMAGING — CR DG CHEST 2V
2 series · 2 of 2 positions shown · non-contrast
Comparison: 05/11/2012

CLINICAL DATA: Shortness of breath.

CHEST - 2 VIEW

[w chest lat]
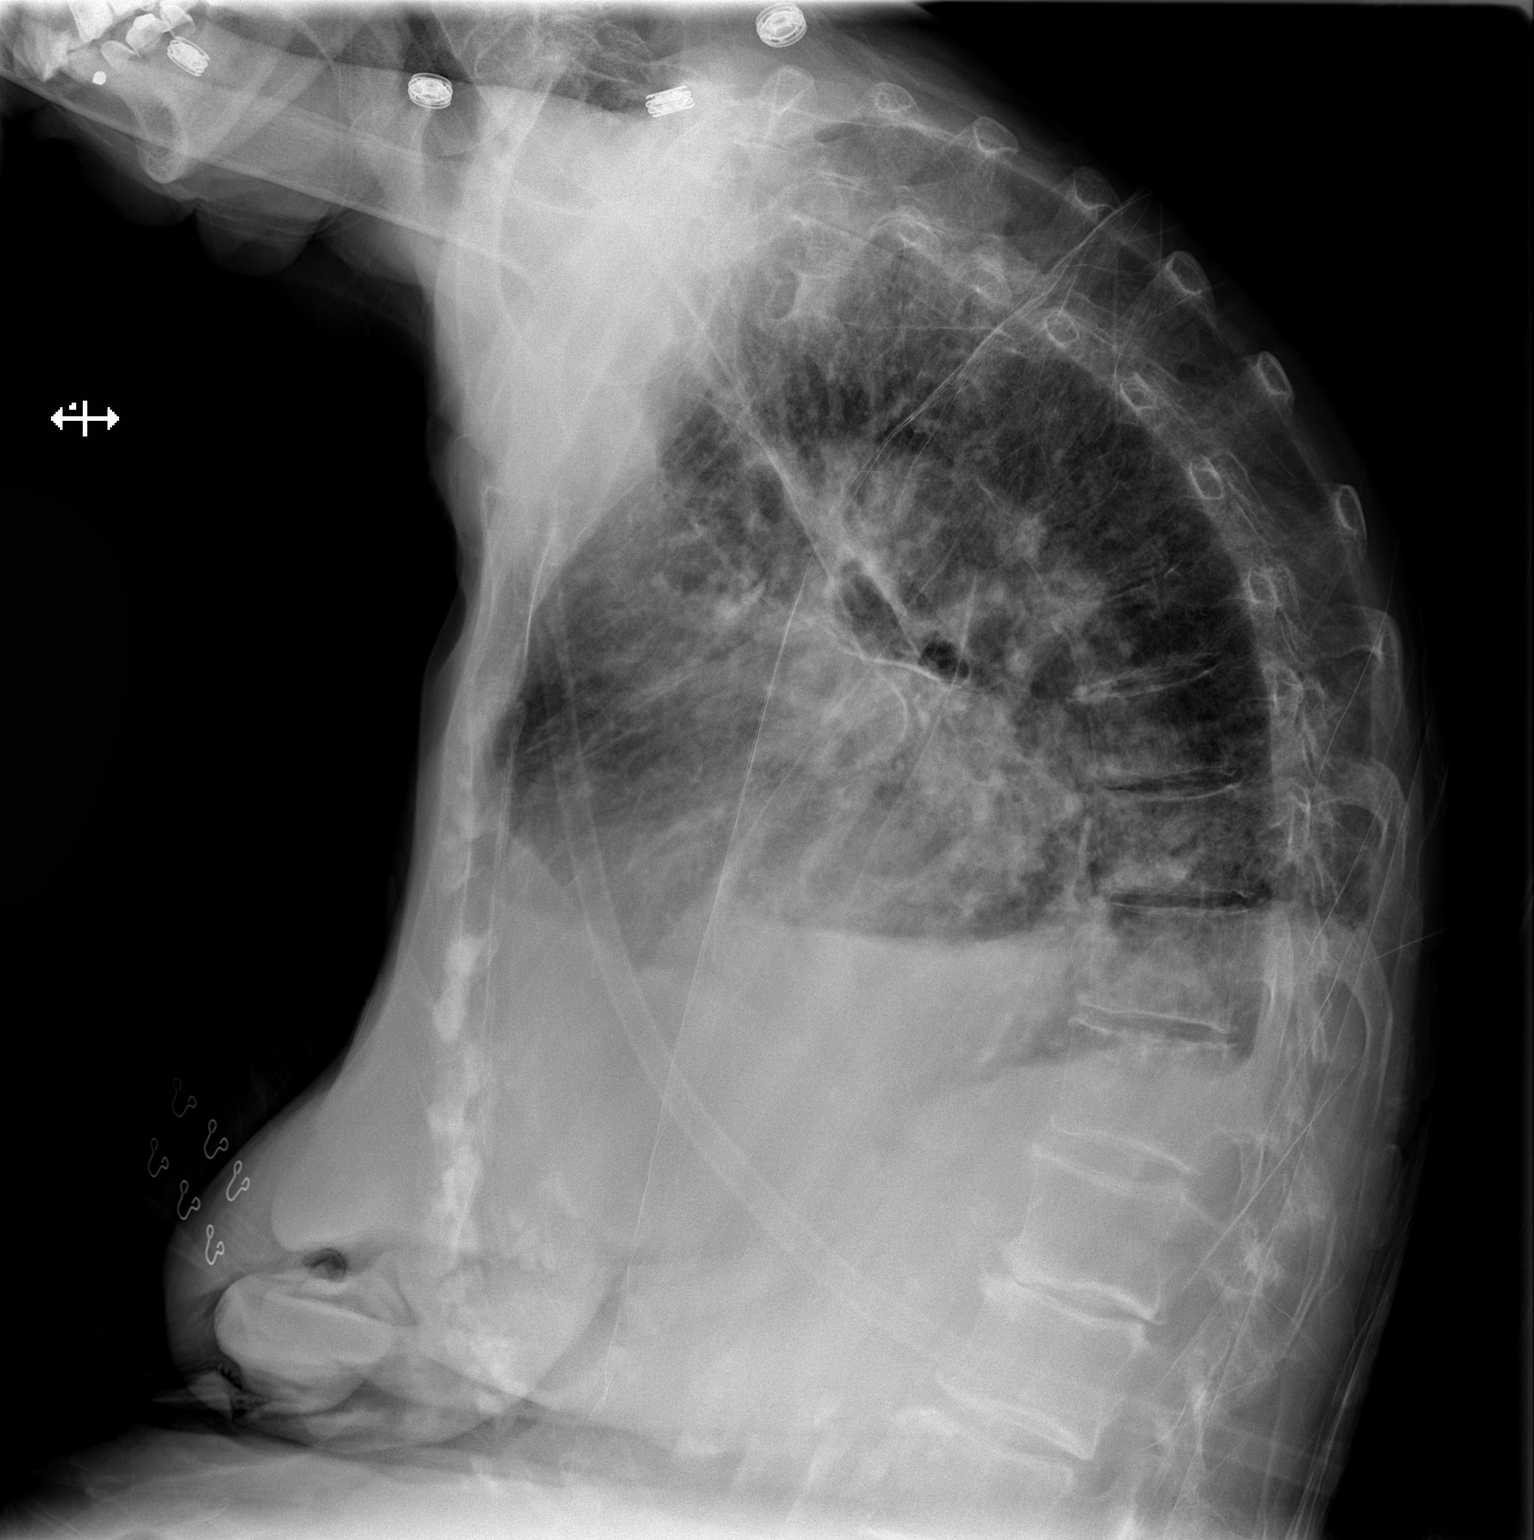

[x chest ap]
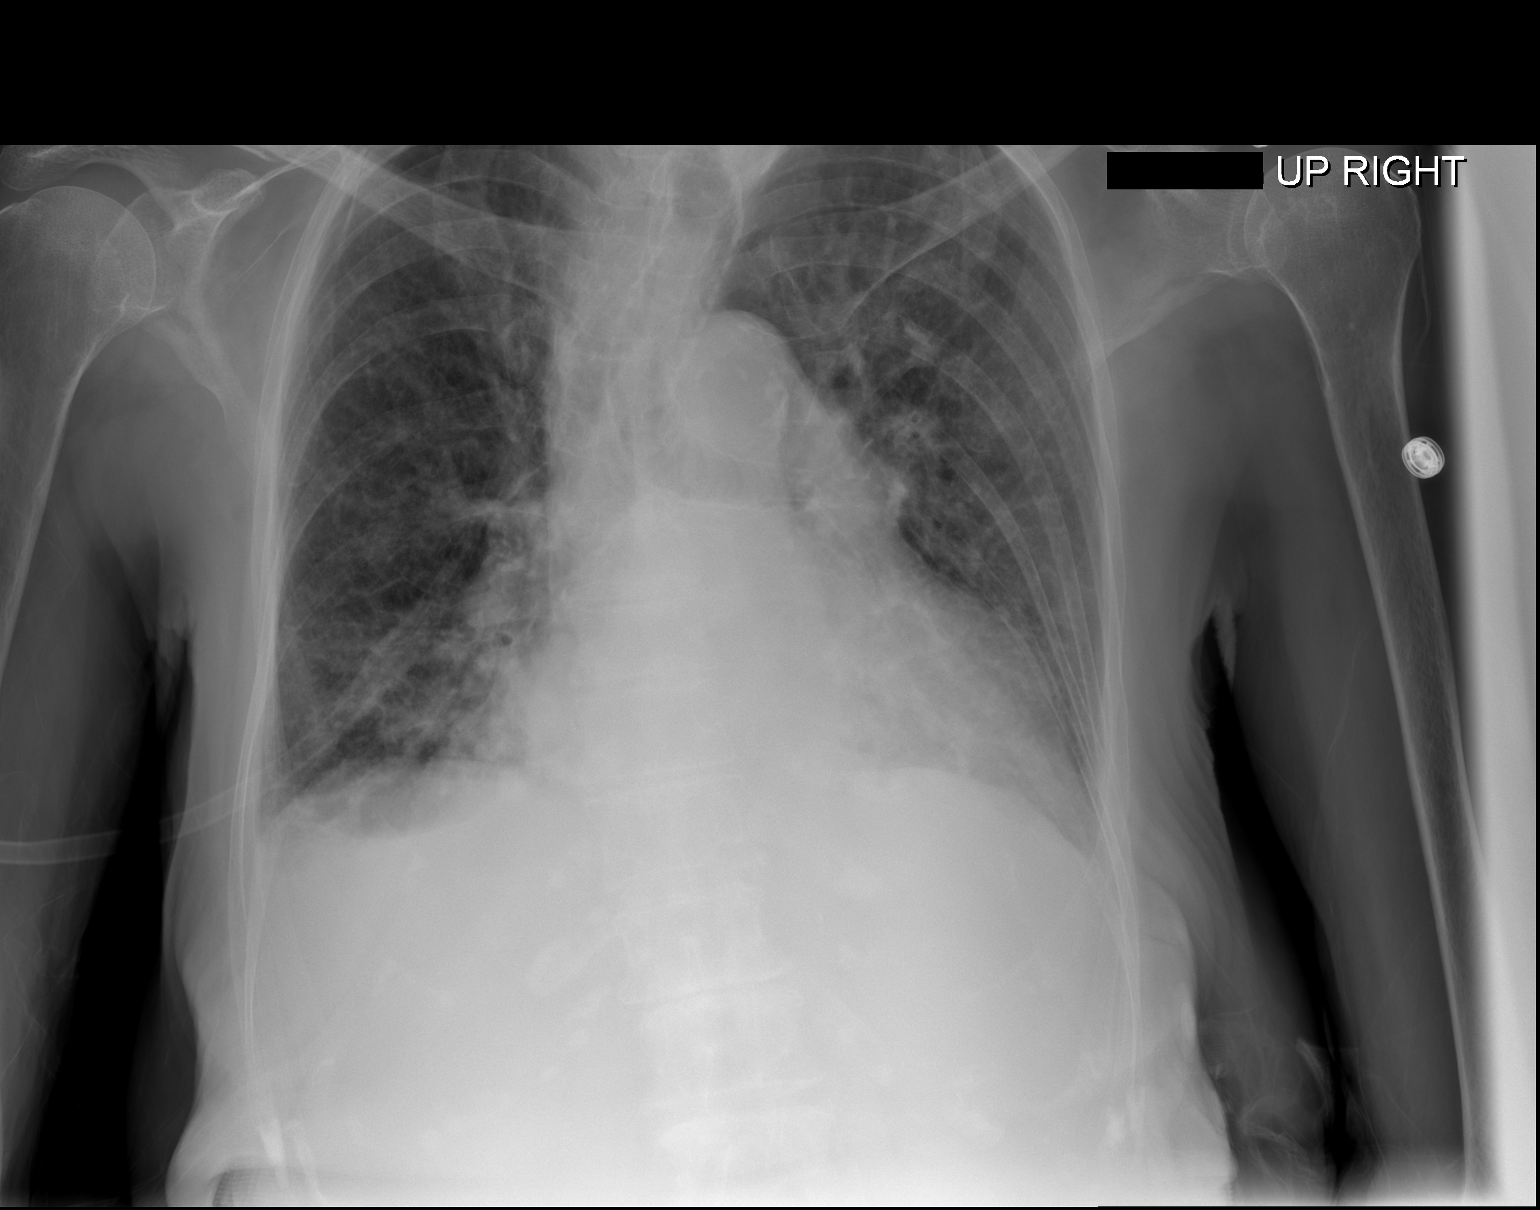

[2 of 2 positions shown; findings below may reference images not displayed]

FINDINGS: There is chronic cardiomegaly.  Slight pulmonary vascular
congestion with slight interstitial accentuation.  Tiny right
effusion.

No significant osseous abnormality.
IMPRESSION: New pulmonary vascular accentuation, at least partially due to
shallow inspiration.  However, the patient does have a new small
right effusion suggesting mild heart failure.
